# Patient Record
Sex: Male | Born: 1935 | Race: White | Hispanic: No | Marital: Married | Smoking: Never smoker
Health system: Southern US, Community
[De-identification: ages and names within clinical notes are randomized; demographics above are authoritative.]

## PROBLEM LIST (undated history)

## (undated) DIAGNOSIS — Z8719 Personal history of other diseases of the digestive system: Secondary | ICD-10-CM

## (undated) DIAGNOSIS — E785 Hyperlipidemia, unspecified: Secondary | ICD-10-CM

## (undated) DIAGNOSIS — C61 Malignant neoplasm of prostate: Secondary | ICD-10-CM

## (undated) DIAGNOSIS — C4490 Unspecified malignant neoplasm of skin, unspecified: Secondary | ICD-10-CM

## (undated) DIAGNOSIS — I2510 Atherosclerotic heart disease of native coronary artery without angina pectoris: Secondary | ICD-10-CM

## (undated) DIAGNOSIS — I700 Atherosclerosis of aorta: Secondary | ICD-10-CM

## (undated) DIAGNOSIS — I493 Ventricular premature depolarization: Secondary | ICD-10-CM

## (undated) DIAGNOSIS — E6609 Other obesity due to excess calories: Secondary | ICD-10-CM

## (undated) DIAGNOSIS — K219 Gastro-esophageal reflux disease without esophagitis: Secondary | ICD-10-CM

## (undated) DIAGNOSIS — R400 Somnolence: Secondary | ICD-10-CM

## (undated) DIAGNOSIS — I10 Essential (primary) hypertension: Secondary | ICD-10-CM

## (undated) DIAGNOSIS — G4733 Obstructive sleep apnea (adult) (pediatric): Secondary | ICD-10-CM

## (undated) HISTORY — DX: Unspecified malignant neoplasm of skin, unspecified: C44.90

## (undated) HISTORY — DX: Gastro-esophageal reflux disease without esophagitis: K21.9

## (undated) HISTORY — DX: Obstructive sleep apnea (adult) (pediatric): G47.33

## (undated) HISTORY — DX: Personal history of other diseases of the digestive system: Z87.19

## (undated) HISTORY — DX: Atherosclerosis of aorta: I70.0

## (undated) HISTORY — PX: CATARACT EXTRACTION: SUR2

## (undated) HISTORY — DX: Other obesity due to excess calories: E66.09

## (undated) HISTORY — DX: Hyperlipidemia, unspecified: E78.5

## (undated) HISTORY — DX: Essential (primary) hypertension: I10

## (undated) HISTORY — DX: Somnolence: R40.0

## (undated) HISTORY — DX: Ventricular premature depolarization: I49.3

## (undated) HISTORY — DX: Atherosclerotic heart disease of native coronary artery without angina pectoris: I25.10

---

## 1999-06-11 ENCOUNTER — Emergency Department (HOSPITAL_COMMUNITY): Admission: EM | Admit: 1999-06-11 | Discharge: 1999-06-11 | Payer: Self-pay | Admitting: *Deleted

## 1999-06-24 ENCOUNTER — Emergency Department (HOSPITAL_COMMUNITY): Admission: EM | Admit: 1999-06-24 | Discharge: 1999-06-24 | Payer: Self-pay | Admitting: Emergency Medicine

## 1999-06-24 ENCOUNTER — Encounter: Payer: Self-pay | Admitting: Emergency Medicine

## 2001-02-07 ENCOUNTER — Encounter (INDEPENDENT_AMBULATORY_CARE_PROVIDER_SITE_OTHER): Payer: Self-pay

## 2001-02-07 ENCOUNTER — Ambulatory Visit (HOSPITAL_COMMUNITY): Admission: RE | Admit: 2001-02-07 | Discharge: 2001-02-07 | Payer: Self-pay | Admitting: Gastroenterology

## 2009-04-14 ENCOUNTER — Encounter: Admission: RE | Admit: 2009-04-14 | Discharge: 2009-04-14 | Payer: Self-pay | Admitting: Cardiology

## 2009-04-18 HISTORY — PX: CARDIOVASCULAR STRESS TEST: SHX262

## 2010-06-09 ENCOUNTER — Ambulatory Visit: Payer: Self-pay | Admitting: Cardiology

## 2010-06-09 ENCOUNTER — Encounter: Admission: RE | Admit: 2010-06-09 | Discharge: 2010-06-09 | Payer: Self-pay | Admitting: Cardiology

## 2010-06-10 ENCOUNTER — Encounter: Admission: RE | Admit: 2010-06-10 | Discharge: 2010-06-10 | Payer: Self-pay | Admitting: Cardiology

## 2011-01-08 ENCOUNTER — Other Ambulatory Visit: Payer: Self-pay | Admitting: Cardiology

## 2011-01-08 DIAGNOSIS — I10 Essential (primary) hypertension: Secondary | ICD-10-CM

## 2011-01-08 NOTE — Telephone Encounter (Signed)
escribe request  

## 2011-03-01 ENCOUNTER — Encounter: Payer: Self-pay | Admitting: Cardiology

## 2011-03-02 ENCOUNTER — Other Ambulatory Visit: Payer: Self-pay | Admitting: *Deleted

## 2011-03-02 DIAGNOSIS — E785 Hyperlipidemia, unspecified: Secondary | ICD-10-CM

## 2011-03-05 ENCOUNTER — Encounter: Payer: Self-pay | Admitting: Cardiology

## 2011-03-08 ENCOUNTER — Encounter: Payer: Self-pay | Admitting: Cardiology

## 2011-03-08 ENCOUNTER — Other Ambulatory Visit (INDEPENDENT_AMBULATORY_CARE_PROVIDER_SITE_OTHER): Payer: Medicare Other | Admitting: *Deleted

## 2011-03-08 ENCOUNTER — Ambulatory Visit (INDEPENDENT_AMBULATORY_CARE_PROVIDER_SITE_OTHER): Payer: Medicare Other | Admitting: Cardiology

## 2011-03-08 DIAGNOSIS — R972 Elevated prostate specific antigen [PSA]: Secondary | ICD-10-CM | POA: Insufficient documentation

## 2011-03-08 DIAGNOSIS — I119 Hypertensive heart disease without heart failure: Secondary | ICD-10-CM

## 2011-03-08 DIAGNOSIS — Z79899 Other long term (current) drug therapy: Secondary | ICD-10-CM

## 2011-03-08 DIAGNOSIS — E7800 Pure hypercholesterolemia, unspecified: Secondary | ICD-10-CM

## 2011-03-08 DIAGNOSIS — G4730 Sleep apnea, unspecified: Secondary | ICD-10-CM

## 2011-03-08 DIAGNOSIS — E785 Hyperlipidemia, unspecified: Secondary | ICD-10-CM

## 2011-03-08 DIAGNOSIS — I10 Essential (primary) hypertension: Secondary | ICD-10-CM | POA: Insufficient documentation

## 2011-03-08 LAB — HEPATIC FUNCTION PANEL
ALT: 32 U/L (ref 0–53)
AST: 25 U/L (ref 0–37)
Albumin: 4.1 g/dL (ref 3.5–5.2)
Alkaline Phosphatase: 65 U/L (ref 39–117)
Bilirubin, Direct: 0.2 mg/dL (ref 0.0–0.3)
Total Bilirubin: 1 mg/dL (ref 0.3–1.2)
Total Protein: 6.5 g/dL (ref 6.0–8.3)

## 2011-03-08 LAB — BASIC METABOLIC PANEL
BUN: 13 mg/dL (ref 6–23)
CO2: 25 mEq/L (ref 19–32)
Calcium: 8.4 mg/dL (ref 8.4–10.5)
Chloride: 96 mEq/L (ref 96–112)
Creatinine, Ser: 0.8 mg/dL (ref 0.4–1.5)
GFR: 106.89 mL/min (ref >60.00)
Glucose, Bld: 66 mg/dL — ABNORMAL LOW (ref 70–99)
Potassium: 4.8 mEq/L (ref 3.5–5.1)
Sodium: 133 mEq/L — ABNORMAL LOW (ref 135–145)

## 2011-03-08 LAB — LIPID PANEL
Cholesterol: 178 mg/dL (ref 0–200)
HDL: 63.5 mg/dL (ref >39.00)
LDL Cholesterol: 100 mg/dL — ABNORMAL HIGH (ref 0–99)
Total CHOL/HDL Ratio: 3
Triglycerides: 73 mg/dL (ref 0.0–149.0)
VLDL: 14.6 mg/dL (ref 0.0–40.0)

## 2011-03-08 NOTE — Assessment & Plan Note (Signed)
The patient wonders if he may have some degree of sleep apnea.  He wakes up feeling refreshed and doesn't have any problem with sleep.  However, his wife states that if the patient sleeps on his back he will sometimes stop breathing and then start again with a exaggerated deep breath.  If he lies on either side which he usually does he has no problem whatsoever.  He's not interested in pursuing this at this time but just mentioned it.

## 2011-03-08 NOTE — Assessment & Plan Note (Signed)
This pleasant 75 year old gentleman has a history of essential hypertension.  He also has a history of mild exogenous obesity since last visit he has lost 2 pounds.  He has not been expressing any chest pain or shortness of breath.He walks for exercise and also plays golf.

## 2011-03-08 NOTE — Assessment & Plan Note (Signed)
The patient has a past history of hypercholesterolemia.  He is on Lipitor 80 mg daily.  He has not been having myalgias or other side effects from the Lipitor.  Blood work today is pending

## 2011-03-08 NOTE — Assessment & Plan Note (Signed)
The patient hasn't known elevated PSA of around 7.  Dr. Vonita Moss was his urologist and is new urologist will be Dr. Lynnae Sandhoff.  At this point they are just watching a PSA with no particular therapy planned at this point.

## 2011-03-08 NOTE — Progress Notes (Signed)
Jason Compton Date of Birth:  18-May-1936 [facility] / [facility] [address]   [address] [place], [place]  [address] [phone]           Fax   [phone]  History of Present Illness: This pleasant 75 year old gentleman is seen for a six-month followup office visit.  He has a history of essential hypertension and a history of hyperlipidemia as well as exogenous obesity.  His last nuclear stress test was August 2010 and was normal.  He has not been expressing any chest pain.  He has not been expressing any shortness of breath with ordinary activity.  He stays busy playing golf for exercise and walks for exercise.  He is on statin therapy and is not having any side effects from his statin therapy  Current Outpatient Prescriptions  Medication Sig Dispense Refill  . aspirin 81 MG tablet Take 81 mg by mouth daily.        Marland Kitchen atorvastatin (LIPITOR) 80 MG tablet Take 80 mg by mouth daily.        Marland Kitchen BENICAR 40 MG tablet TAKE 1 TABLET DAILY  90 tablet  3  . calcium carbonate (OS-CAL) 600 MG TABS Take 600 mg by mouth daily.        . cholecalciferol (VITAMIN D) 1000 UNITS tablet Take 1,000 Units by mouth daily.        . Multiple Vitamin (MULTIVITAMIN) tablet Take 1 tablet by mouth daily.        Marland Kitchen omeprazole (PRILOSEC) 20 MG capsule Take 20 mg by mouth 2 (two) times daily.        . vitamin C (ASCORBIC ACID) 500 MG tablet Take 500 mg by mouth daily.          Allergies  Allergen Reactions  . Altace (Ramipril)     Patient Active Problem List  Diagnoses  . Benign hypertensive heart disease without heart failure  . Hypercholesterolemia  . PSA elevation  . Sleep apnea    History  Smoking status  . Former Smoker  . Quit date: 08/23/2006  Smokeless tobacco  . Not on file    History  Alcohol Use No    Family History  Problem Relation Age of Onset  . Osteoporosis Mother   . Cancer Father   . Hypertension Brother     Review of Systems: Constitutional: no fever  chills diaphoresis or fatigue or change in weight.  Head and neck: no hearing loss, no epistaxis, no photophobia or visual disturbance. Respiratory: No cough, shortness of breath or wheezing. Cardiovascular: No chest pain peripheral edema, palpitations. Gastrointestinal: No abdominal distention, no abdominal pain, no change in bowel habits hematochezia or melena. Genitourinary: No dysuria, no frequency, no urgency, no nocturia. Musculoskeletal:No arthralgias, no back pain, no gait disturbance or myalgias. Neurological: No dizziness, no headaches, no numbness, no seizures, no syncope, no weakness, no tremors. Hematologic: No lymphadenopathy, no easy bruising. Psychiatric: No confusion, no hallucinations, no sleep disturbance.    Physical Exam: Filed Vitals:   03/08/11 0955  BP: 126/76  Pulse: 68  The general appearance feels a well-developed well-nourished gentleman in no distress.The head and neck exam reveals pupils equal and reactive.  Extraocular movements are full.  There is no scleral icterus.  The mouth and pharynx are normal.  The neck is supple.  The carotids reveal no bruits.  The jugular venous pressure is normal.  The  thyroid is not enlarged.  There is no lymphadenopathy.  The chest is clear  to percussion and auscultation.  There are no rales or rhonchi.  Expansion of the chest is symmetrical.  The precordium is quiet.  The first heart sound is normal.  The second heart sound is physiologically split.  There is no murmur gallop rub or click.  There is no abnormal lift or heave.  The abdomen is soft and nontender.  The bowel sounds are normal.  The liver and spleen are not enlarged.  There are no abdominal masses.  There are no abdominal bruits.  Extremities reveal good pedal pulses.  There is no phlebitis or edema.  There is no cyanosis or clubbing.  Strength is normal and symmetrical in all extremities.  There is no lateralizing weakness.  There are no sensory deficits.  The skin is  warm and dry.  There is no rash.   Assessment / Plan: The patient is to continue same medication.  Lab work is pending.  He will return in 6 months for a comprehensive medical examination.

## 2011-03-09 ENCOUNTER — Telehealth: Payer: Self-pay | Admitting: *Deleted

## 2011-03-09 ENCOUNTER — Encounter: Payer: Self-pay | Admitting: Cardiology

## 2011-03-09 NOTE — Telephone Encounter (Signed)
Labs reported to pt 

## 2011-03-30 ENCOUNTER — Telehealth: Payer: Self-pay | Admitting: Cardiology

## 2011-03-30 NOTE — Telephone Encounter (Signed)
Pt states needs a medical recommendation from Dr. Patty Sermons and would like a call back, asked to speak with Juliette Alcide

## 2011-03-30 NOTE — Telephone Encounter (Signed)
Called patient, had a question regarding a recommended gastroenterology group for his wife.

## 2011-07-12 ENCOUNTER — Encounter: Payer: Self-pay | Admitting: Cardiology

## 2011-07-12 ENCOUNTER — Ambulatory Visit (INDEPENDENT_AMBULATORY_CARE_PROVIDER_SITE_OTHER): Payer: Medicare Other | Admitting: Cardiology

## 2011-07-12 ENCOUNTER — Ambulatory Visit
Admission: RE | Admit: 2011-07-12 | Discharge: 2011-07-12 | Disposition: A | Discharge status: Home / Self Care | Payer: Medicare Other | Source: Ambulatory Visit | Attending: Cardiology | Admitting: Cardiology

## 2011-07-12 DIAGNOSIS — I119 Hypertensive heart disease without heart failure: Secondary | ICD-10-CM

## 2011-07-12 DIAGNOSIS — R109 Unspecified abdominal pain: Secondary | ICD-10-CM | POA: Insufficient documentation

## 2011-07-12 DIAGNOSIS — E7800 Pure hypercholesterolemia, unspecified: Secondary | ICD-10-CM

## 2011-07-12 LAB — BASIC METABOLIC PANEL
BUN: 13 mg/dL (ref 6–23)
CO2: 27 mEq/L (ref 19–32)
Calcium: 8.8 mg/dL (ref 8.4–10.5)
Chloride: 103 mEq/L (ref 96–112)
Creatinine, Ser: 0.8 mg/dL (ref 0.4–1.5)
GFR: 97.83 mL/min (ref >60.00)
Glucose, Bld: 92 mg/dL (ref 70–99)
Potassium: 4.3 mEq/L (ref 3.5–5.1)
Sodium: 137 mEq/L (ref 135–145)

## 2011-07-12 LAB — HEPATIC FUNCTION PANEL
ALT: 29 U/L (ref 0–53)
AST: 21 U/L (ref 0–37)
Albumin: 3.7 g/dL (ref 3.5–5.2)
Alkaline Phosphatase: 61 U/L (ref 39–117)
Bilirubin, Direct: 0.2 mg/dL (ref 0.0–0.3)
Total Bilirubin: 1.2 mg/dL (ref 0.3–1.2)
Total Protein: 6.2 g/dL (ref 6.0–8.3)

## 2011-07-12 LAB — CBC WITH DIFFERENTIAL/PLATELET
Basophils Absolute: 0 10*3/uL (ref 0.0–0.1)
Basophils Relative: 0.4 % (ref 0.0–3.0)
Eosinophils Absolute: 0 10*3/uL (ref 0.0–0.7)
Eosinophils Relative: 0.8 % (ref 0.0–5.0)
HCT: 46 % (ref 39.0–52.0)
Hemoglobin: 15.5 g/dL (ref 13.0–17.0)
Lymphocytes Relative: 29.1 % (ref 12.0–46.0)
Lymphs Abs: 1.4 10*3/uL (ref 0.7–4.0)
MCHC: 33.7 g/dL (ref 30.0–36.0)
MCV: 99.5 fl (ref 78.0–100.0)
Monocytes Absolute: 0.4 10*3/uL (ref 0.1–1.0)
Monocytes Relative: 9.3 % (ref 3.0–12.0)
Neutro Abs: 2.9 10*3/uL (ref 1.4–7.7)
Neutrophils Relative %: 60.4 % (ref 43.0–77.0)
Platelets: 180 10*3/uL (ref 150.0–400.0)
RBC: 4.63 Mil/uL (ref 4.22–5.81)
RDW: 14.1 % (ref 11.5–14.6)
WBC: 4.8 10*3/uL (ref 4.5–10.5)

## 2011-07-12 LAB — LIPASE: Lipase: 22 U/L (ref 11.0–59.0)

## 2011-07-12 LAB — AMYLASE: Amylase: 53 U/L (ref 27–131)

## 2011-07-12 NOTE — Progress Notes (Signed)
Jason Compton Date of Birth:  December 14, 1935 [facility] / [facility] [address]   [address] [place], [place]  [address] [phone]           Fax   [phone]  History of Present Illness: This pleasant 75 year old gentleman is seen for a work in an office visit.  He has a past history of high cholesterol and essential hypertension.  He comes in today because of epigastric pain.  This discomfort began 6-8 weeks ago after he went out to dinner with his wife.  The next morning he noted an epigastric dull discomfort which has persisted since then.  Occasionally it radiates to his back.  Occasionally he has had left scapular discomfort. Initially he had nausea but the nausea had cleared.  Initially his stools were dark but his stools have now become normal color.  He has one normal bowel movement today.  Not have any prior similar symptoms.  He does have a history of gastroesophageal reflux disease and has been on long-term Moton pump inhibitor.  An upper endoscopy and a colonoscopy by Dr. Kinnie Scales  4 to 5 years ago.  Current Outpatient Prescriptions  Medication Sig Dispense Refill  . aspirin 81 MG tablet Take 81 mg by mouth daily.        Marland Kitchen atorvastatin (LIPITOR) 80 MG tablet Take 80 mg by mouth daily.        Marland Kitchen BENICAR 40 MG tablet TAKE 1 TABLET DAILY  90 tablet  3  . calcium carbonate (OS-CAL) 600 MG TABS Take 600 mg by mouth daily.        . cholecalciferol (VITAMIN D) 1000 UNITS tablet Take 1,000 Units by mouth daily.        . Multiple Vitamin (MULTIVITAMIN) tablet Take 1 tablet by mouth daily.        Marland Kitchen omeprazole (PRILOSEC) 20 MG capsule Take 20 mg by mouth 2 (two) times daily.        . vitamin C (ASCORBIC ACID) 500 MG tablet Take 500 mg by mouth daily.          Allergies  Allergen Reactions  . Altace (Ramipril)     Patient Active Problem List  Diagnoses  . Benign hypertensive heart disease without heart failure  . Hypercholesterolemia  . PSA elevation  . Sleep apnea     History  Smoking status  . Former Smoker  . Quit date: 08/23/2006  Smokeless tobacco  . Not on file    History  Alcohol Use No    Family History  Problem Relation Age of Onset  . Osteoporosis Mother   . Cancer Father   . Hypertension Brother     Review of Systems: Constitutional: no fever chills diaphoresis or fatigue or change in weight.  Head and neck: no hearing loss, no epistaxis, no photophobia or visual disturbance. Respiratory: No cough, shortness of breath or wheezing. Cardiovascular: No chest pain peripheral edema, palpitations. Gastrointestinal: No abdominal distention, no abdominal pain, no change in bowel habits hematochezia or melena. Genitourinary: No dysuria, no frequency, no urgency, no nocturia. Musculoskeletal:No arthralgias, no back pain, no gait disturbance or myalgias. Neurological: No dizziness, no headaches, no numbness, no seizures, no syncope, no weakness, no tremors. Hematologic: No lymphadenopathy, no easy bruising. Psychiatric: No confusion, no hallucinations, no sleep disturbance.    Physical Exam: Filed Vitals:   07/12/11 1134  BP: 118/80  Pulse: 78   the general appearance reveals a well-developed well-nourished gentleman in no distress.Pupils equal and  reactive.   Extraocular Movements are full.  There is no scleral icterus.  The mouth and pharynx are normal.  The neck is supple.  The carotids reveal no bruits.  The jugular venous pressure is normal.  The thyroid is not enlarged.  There is no lymphadenopathy.  The chest is clear to percussion and auscultation. There are no rales or rhonchi. Expansion of the chest is symmetrical.  The precordium is quiet.  The first heart sound is normal.  The second heart sound is physiologically split.  There is no murmur gallop rub or click.  There is no abnormal lift or heave.  The abdomen reveals no hepatosplenomegaly.  The abdominal aorta is palpable and is very slightly tender.  No definite  widening of the abdominal aorta.  Bowel sounds are active.  There is no rebound. The pedal pulses are good.  There is no phlebitis or edema.  There is no cyanosis or clubbing. Strength is normal and symmetrical in all extremities.  There is no lateralizing weakness.  There are no sensory deficits.  The skin is warm and dry.  There is no rash.    Assessment / Plan: Await results of today's lab work and ultrasound of the abdomen.  He was cautioned not to do any heavy lifting or straining.  Continue same medications.  Consider referral back to his gastroenterologist if diagnosis remains unclear after today's tests.

## 2011-07-12 NOTE — Patient Instructions (Signed)
Go for your abdominal ultrasound today at 3:45 for 4:00, nothing to eat or drink until after then. Will obtain labs today.  Will call you the results of both tests.

## 2011-07-12 NOTE — Assessment & Plan Note (Signed)
No new cardiovascular symptoms.  No chest pain or shortness of breath.  No palpitations dizziness or syncope.

## 2011-07-12 NOTE — Assessment & Plan Note (Signed)
Patient will continue present dose of Lipitor.  Is not having any side effects from Lipitor.

## 2011-07-12 NOTE — Assessment & Plan Note (Signed)
The abdominal discomfort is dull and persists unchanged throughout the day.  It does not prevent him from getting a good night sleep at night.  It does not seem to be worsened or precipitated by eating.  His examination today reveals slight tenderness over the abdominal aorta.  We are going to get blood work today including CBC hepatic function panel basal metabolic panel amylase and lipase.  We are also scheduling him for an ultrasound of the abdomen to include the pancreas liver gallbladder and abdominal aorta.  This test will be done late this afternoon.  The patient is scheduled to fly to [place] tomorrow to partake in a funeral service for one of his wife's relatives.

## 2011-07-13 ENCOUNTER — Telehealth: Payer: Self-pay | Admitting: *Deleted

## 2011-07-13 NOTE — Telephone Encounter (Signed)
Advise of results

## 2011-07-13 NOTE — Telephone Encounter (Signed)
Message copied by Burnell Blanks on Tue Jul 13, 2011 11:19 AM ------      Message from: Cassell Clement      Created: Mon Jul 12, 2011  5:08 PM       Please report.  The abdominal ultrasound was normal.  The abdominal aorta was normal and there was no aneurysm.  The cause of his abdominal discomfort is not yet clear.  We will wait for the results of the blood work.  If the diagnosis remains unclear we would want him to see his gastroenterologist Dr. Kinnie Scales for his opinion.

## 2011-07-13 NOTE — Telephone Encounter (Signed)
Message copied by Burnell Blanks on Tue Jul 13, 2011 11:18 AM ------      Message from: Cassell Clement      Created: Mon Jul 12, 2011  6:10 PM       Please report.  The CBC is normal.  Her liver function studies are normal.  Electrolytes are normal.  The pancreas enzymes are normal.  Ultrasound of his abdomen and aorta were normal.  If symptoms persist I would suggest that he caught his gastroenterologist Dr. Kinnie Scales.

## 2011-07-13 NOTE — Telephone Encounter (Signed)
Advised patient

## 2011-07-19 ENCOUNTER — Telehealth: Payer: Self-pay | Admitting: Cardiology

## 2011-07-19 NOTE — Telephone Encounter (Signed)
Pt was told to see a gastro per dr Patty Sermons, has question about a particular dr, can he make appt or do we need to?

## 2011-07-19 NOTE — Telephone Encounter (Signed)
New message:  Please call patient.  He said you would know why.

## 2011-07-19 NOTE — Telephone Encounter (Signed)
Let's go ahead and get a abdominal CT scan with contrast to evaluate him further for the cause of his unexplained epigastric pain and tenderness.

## 2011-07-19 NOTE — Telephone Encounter (Signed)
Spoke with patient and he is an established patient of Dr Kinnie Scales. Advised he could call and schedule appointment himself.  Forwarded records to Dr Kinnie Scales

## 2011-07-19 NOTE — Telephone Encounter (Signed)
Wednesday, Thursday & Friday after 2:00

## 2011-07-19 NOTE — Telephone Encounter (Signed)
Still having the same discomfort he has been having.  Unable to get in with GI doctor until middle of January, is this ok to wait?

## 2011-07-20 NOTE — Telephone Encounter (Signed)
Scheduled and advised patient

## 2011-07-20 NOTE — Telephone Encounter (Signed)
12;15 for 12;30 npo 4 hours prior and pick up contrast today-left message

## 2011-07-21 ENCOUNTER — Ambulatory Visit
Admission: RE | Admit: 2011-07-21 | Discharge: 2011-07-21 | Disposition: A | Discharge status: Home / Self Care | Payer: Medicare Other | Source: Ambulatory Visit | Attending: Cardiology | Admitting: Cardiology

## 2011-07-21 MED ORDER — IOHEXOL 300 MG/ML  SOLN
100.0000 mL | Freq: Once | INTRAMUSCULAR | Status: AC | PRN
  Administered 2011-07-21: 100 mL via INTRAVENOUS

## 2011-07-27 ENCOUNTER — Telehealth: Payer: Self-pay | Admitting: Cardiology

## 2011-07-27 NOTE — Telephone Encounter (Signed)
New Msg: Wife calling wanting to make sure Dr. Kinnie Scales gets a copy of all the test restlts Dr. Patty Sermons just did on pt. Please return pt call to discuss further if necessary.

## 2011-07-27 NOTE — Telephone Encounter (Signed)
Sent records again to Dr [facility] office.  Called GI and asked that they get together films for wife to pick up in am.  Advised wife

## 2011-09-16 ENCOUNTER — Encounter: Payer: Self-pay | Admitting: Cardiology

## 2011-09-16 DIAGNOSIS — Z86010 Personal history of colonic polyps: Secondary | ICD-10-CM | POA: Diagnosis not present

## 2011-09-16 DIAGNOSIS — K5730 Diverticulosis of large intestine without perforation or abscess without bleeding: Secondary | ICD-10-CM | POA: Diagnosis not present

## 2011-09-16 DIAGNOSIS — Z1211 Encounter for screening for malignant neoplasm of colon: Secondary | ICD-10-CM | POA: Diagnosis not present

## 2011-09-16 DIAGNOSIS — R1013 Epigastric pain: Secondary | ICD-10-CM | POA: Diagnosis not present

## 2011-09-16 DIAGNOSIS — K648 Other hemorrhoids: Secondary | ICD-10-CM | POA: Diagnosis not present

## 2011-09-16 DIAGNOSIS — R109 Unspecified abdominal pain: Secondary | ICD-10-CM | POA: Diagnosis not present

## 2011-09-16 DIAGNOSIS — D126 Benign neoplasm of colon, unspecified: Secondary | ICD-10-CM | POA: Diagnosis not present

## 2011-09-21 ENCOUNTER — Encounter: Payer: Self-pay | Admitting: Cardiology

## 2011-09-21 DIAGNOSIS — K5730 Diverticulosis of large intestine without perforation or abscess without bleeding: Secondary | ICD-10-CM | POA: Diagnosis not present

## 2011-09-21 DIAGNOSIS — R1013 Epigastric pain: Secondary | ICD-10-CM | POA: Diagnosis not present

## 2011-09-24 ENCOUNTER — Encounter: Payer: Self-pay | Admitting: Cardiology

## 2011-09-24 DIAGNOSIS — R1013 Epigastric pain: Secondary | ICD-10-CM | POA: Diagnosis not present

## 2011-09-24 DIAGNOSIS — R1084 Generalized abdominal pain: Secondary | ICD-10-CM | POA: Diagnosis not present

## 2011-09-24 DIAGNOSIS — R634 Abnormal weight loss: Secondary | ICD-10-CM | POA: Diagnosis not present

## 2011-09-29 ENCOUNTER — Telehealth: Payer: Self-pay | Admitting: Cardiology

## 2011-09-29 DIAGNOSIS — H20019 Primary iridocyclitis, unspecified eye: Secondary | ICD-10-CM | POA: Diagnosis not present

## 2011-09-29 NOTE — Telephone Encounter (Signed)
New problem Pt's wife called She said he has an infection and wants to see Dr Patty Sermons. She did not want to see Lawson Fiscal. Please call her back

## 2011-09-29 NOTE — Telephone Encounter (Signed)
3 days ago eye red and very inflammation and saw eye doctor today.  She put him on eye drops and said to follow up with PCP for ov and labs

## 2011-09-29 NOTE — Telephone Encounter (Signed)
Fu call °Pt's wife returning your call °

## 2011-09-29 NOTE — Telephone Encounter (Signed)
Left messge.

## 2011-09-29 NOTE — Telephone Encounter (Signed)
Scheduled appointment for Tuesday, stated not urgent

## 2011-09-30 DIAGNOSIS — H20019 Primary iridocyclitis, unspecified eye: Secondary | ICD-10-CM | POA: Diagnosis not present

## 2011-10-04 DIAGNOSIS — H20019 Primary iridocyclitis, unspecified eye: Secondary | ICD-10-CM | POA: Diagnosis not present

## 2011-10-06 ENCOUNTER — Ambulatory Visit
Admission: RE | Admit: 2011-10-06 | Discharge: 2011-10-06 | Disposition: A | Discharge status: Home / Self Care | Payer: Medicare Other | Source: Ambulatory Visit | Attending: Cardiology | Admitting: Cardiology

## 2011-10-06 ENCOUNTER — Telehealth: Payer: Self-pay | Admitting: *Deleted

## 2011-10-06 ENCOUNTER — Ambulatory Visit (INDEPENDENT_AMBULATORY_CARE_PROVIDER_SITE_OTHER): Payer: Medicare Other | Admitting: Cardiology

## 2011-10-06 ENCOUNTER — Encounter: Payer: Self-pay | Admitting: Cardiology

## 2011-10-06 VITALS — BP 110/70 | HR 80 | Ht 68.0 in | Wt 182.0 lb

## 2011-10-06 DIAGNOSIS — Z1159 Encounter for screening for other viral diseases: Secondary | ICD-10-CM

## 2011-10-06 DIAGNOSIS — E7800 Pure hypercholesterolemia, unspecified: Secondary | ICD-10-CM

## 2011-10-06 DIAGNOSIS — R109 Unspecified abdominal pain: Secondary | ICD-10-CM

## 2011-10-06 DIAGNOSIS — I119 Hypertensive heart disease without heart failure: Secondary | ICD-10-CM

## 2011-10-06 DIAGNOSIS — E559 Vitamin D deficiency, unspecified: Secondary | ICD-10-CM | POA: Diagnosis not present

## 2011-10-06 DIAGNOSIS — H209 Unspecified iridocyclitis: Secondary | ICD-10-CM | POA: Diagnosis not present

## 2011-10-06 DIAGNOSIS — R972 Elevated prostate specific antigen [PSA]: Secondary | ICD-10-CM

## 2011-10-06 DIAGNOSIS — J329 Chronic sinusitis, unspecified: Secondary | ICD-10-CM

## 2011-10-06 DIAGNOSIS — J3489 Other specified disorders of nose and nasal sinuses: Secondary | ICD-10-CM | POA: Diagnosis not present

## 2011-10-06 DIAGNOSIS — I10 Essential (primary) hypertension: Secondary | ICD-10-CM | POA: Diagnosis not present

## 2011-10-06 LAB — SEDIMENTATION RATE: Sed Rate: 5 mm/hr (ref 0–22)

## 2011-10-06 NOTE — Progress Notes (Signed)
Jason Compton Date of Birth:  1936/01/20 [facility] [address] [place], [place]  [address] [phone]         Fax   [phone]  History of Present Illness: This pleasant 76 year old gentleman is seen for a followup office visit.  He has an interesting recent past history.  He has had significant weight loss over the past 6 months.  He has lost about 40 pounds.  He has a poor appetite and has developed some fat intolerance.  He has had a full GI workup including CT scan of the abdomen as well as endoscopy.  He had a recent biopsy which suggested possible celiac sprue.  However he had a subsequent negative antibody test for celiac sprue.  He has seen Durwin Nora who has instructed him on a gluten-free diet.  He recently saw his ophthalmologist who diagnosed systemic uveitis and raised questions of possible systemic disease or collagen vascular disease and gave [place] a list of blood tests that she recommended that he have.  We ordered those blood tests today.  Tolerance recently added fish oil I thousand and milligrams daily and increased his vitamin D3 up to a dose of 2000 units daily.  The patient has not been experiencing any recent chest pain.  He has had less energy.  Since we last saw him he has lost 19 more pounds.  Current Outpatient Prescriptions  Medication Sig Dispense Refill  . aspirin 81 MG tablet Take 81 mg by mouth daily.        Marland Kitchen atorvastatin (LIPITOR) 80 MG tablet Take 80 mg by mouth daily.        . calcium carbonate (OS-CAL) 600 MG TABS Take 600 mg by mouth daily.        . cholecalciferol (VITAMIN D) 1000 UNITS tablet Take 2,000 Units by mouth daily.       . Multiple Vitamin (MULTIVITAMIN) tablet Take 1 tablet by mouth daily.        . Omega-3 Fatty Acids (FISH OIL PO) Take by mouth daily.      . ranitidine (ZANTAC) 150 MG tablet Take 150 mg by mouth as needed.      . vitamin C (ASCORBIC ACID) 500 MG tablet Take 500 mg by mouth daily.           Allergies  Allergen Reactions  . Altace (Ramipril)     Patient Active Problem List  Diagnoses  . Benign hypertensive heart disease without heart failure  . Hypercholesterolemia  . PSA elevation  . Sleep apnea  . Abdominal pain    History  Smoking status  . Former Smoker  . Quit date: 08/23/2006  Smokeless tobacco  . Not on file    History  Alcohol Use No    Family History  Problem Relation Age of Onset  . Osteoporosis Mother   . Cancer Father   . Hypertension Brother     Review of Systems: Constitutional: no fever chills diaphoresis or fatigue or change in weight.  Head and neck: no hearing loss, no epistaxis, no photophobia or visual disturbance. Respiratory: No cough, shortness of breath or wheezing. Cardiovascular: No chest pain peripheral edema, palpitations. Gastrointestinal: No abdominal distention, no abdominal pain, no change in bowel habits hematochezia or melena. Genitourinary: No dysuria, no frequency, no urgency, no nocturia. Musculoskeletal:No arthralgias, no back pain, no gait disturbance or myalgias. Neurological: No dizziness, no headaches, no numbness, no seizures, no syncope, no weakness, no tremors. Hematologic: No lymphadenopathy, no easy  bruising. Psychiatric: No confusion, no hallucinations, no sleep disturbance.    Physical Exam: Filed Vitals:   10/06/11 1044  BP: 110/70  Pulse: 80   the general appearance reveals a well-developed well-nourished gentleman in no distress.  Head and neck exam reveals a dry rash on the scalp and face.  He also has evidence of low grade bilateral uveitis.  No thyroid enlargement.  No lymphadenopathy.The chest is clear to percussion and auscultation. There are no rales or rhonchi. Expansion of the chest is symmetrical.  The precordium is quiet.  The first heart sound is normal.  The second heart sound is physiologically split.  There is no murmur gallop rub or click.  There is no abnormal lift or  heave.  The abdomen is soft and nontender. Bowel sounds are normal. The liver and spleen are not enlarged. There Are no abdominal masses. There are no bruits.  The pedal pulses are good.  There is no phlebitis or edema.  There is no cyanosis or clubbing. Strength is normal and symmetrical in all extremities.  There is no lateralizing weakness.  There are no sensory deficits.     Assessment / Plan: We will obtain labs as suggested by his ophthalmologist in regard to his bilateral uveitis.  He has already had a CBC.  We will get a sedimentation rate and a nuclear antibody as well as a RPR and FTA and a serum ACE level.  We'll also check lipid panel and vitamin D level.  Also get a chest x-ray in since he is also having chronic nasal drainage we will get a series of sinus x-rays.  We will stop his Benicar at this point and consider subsequent reduction in Lipitor when we see what today's labs are.  He will return for a comprehensive office visit in 2 months.

## 2011-10-06 NOTE — Telephone Encounter (Signed)
Message copied by Burnell Blanks on Wed Oct 06, 2011  3:15 PM ------      Message from: Cassell Clement      Created: Wed Oct 06, 2011  2:22 PM       Please report.  Chest xray is normal.  No enlarged heart. Lungs clear.  No evidence of sarcoidosis or TB or other systemic problem

## 2011-10-06 NOTE — Patient Instructions (Signed)
Will obtain labs today and call you with the results Will get xrays and call with the results Follow up April 22 at 10:45, will get labs on April 18 after 8:30 am ---fasting

## 2011-10-06 NOTE — Telephone Encounter (Signed)
Advised patient and mailed copy 

## 2011-10-06 NOTE — Assessment & Plan Note (Signed)
The patient has a history of hypercholesterolemia and has been on Lipitor 80 mg daily.  His nutritionist and his wife were concerned about possible long-term adverse affects on memory etc. we will see what his lipid status is today and consider significant reduction in Lipitor.

## 2011-10-06 NOTE — Assessment & Plan Note (Signed)
He has a long history of elevated PSA and is followed at regular intervals by his urologist.

## 2011-10-06 NOTE — Telephone Encounter (Signed)
Message copied by Burnell Blanks on Wed Oct 06, 2011  3:15 PM ------      Message from: Cassell Clement      Created: Wed Oct 06, 2011  2:21 PM       Please report.  Sinus xrays show no evidence of infection or fluid.  Mild mucosal thickening may be due to allergies.  He could try some OTC claritin 10 mg daily prn.

## 2011-10-06 NOTE — Assessment & Plan Note (Signed)
The patient discovered on the Internet any reference to a study which links Benicar to a GI disorder that mimics celiac disease.  As a result of his weight loss his blood pressure is down and we will stop his Benicar and observe response.  At this point we will not substitute any other pressure medication.

## 2011-10-07 LAB — ANGIOTENSIN CONVERTING ENZYME: Angiotensin-Converting Enzyme: 26 U/L (ref 8–52)

## 2011-10-07 LAB — ANTI-NUCLEAR AB-TITER (ANA TITER): ANA Titer 1: 1:1280 {titer} — ABNORMAL HIGH

## 2011-10-07 LAB — RPR

## 2011-10-07 LAB — ANA: Anti Nuclear Antibody(ANA): POSITIVE — AB

## 2011-10-07 LAB — VITAMIN D 25 HYDROXY (VIT D DEFICIENCY, FRACTURES): Vit D, 25-Hydroxy: 38 ng/mL (ref 30–89)

## 2011-10-08 LAB — FLUORESCENT TREPONEMAL AB(FTA)-IGG-BLD: Fluorescent Treponemal Ab, IgG: NONREACTIVE

## 2011-10-11 ENCOUNTER — Telehealth: Payer: Self-pay | Admitting: Cardiology

## 2011-10-11 NOTE — Telephone Encounter (Signed)
Pt's wife wants results of blood work faxed to them, have to call her first then can put phone on fax so we can send, pls call

## 2011-10-11 NOTE — Telephone Encounter (Signed)
Spoke with wife and she is ok with mailing information to patient, advised will send out

## 2011-10-14 ENCOUNTER — Telehealth: Payer: Self-pay | Admitting: Cardiology

## 2011-10-14 NOTE — Telephone Encounter (Signed)
Larita Fife from [facility] called because she said she  received  Documentation on pt via fax yesterday. This documentation is addressed to Dr. Sharrell Ku. The GI in that office is Dr Hilma Favors . Larita Fife will discard this document and want for Juliette Alcide to know about it.

## 2011-10-14 NOTE — Telephone Encounter (Signed)
New problem;  Per Larita Fife, receive records on yesterday, have some questions

## 2011-10-15 ENCOUNTER — Telehealth: Payer: Self-pay | Admitting: Cardiology

## 2011-10-15 ENCOUNTER — Telehealth: Payer: Self-pay | Admitting: *Deleted

## 2011-10-15 NOTE — Telephone Encounter (Signed)
New problem   Patient would like a return call regarding lab work being sent to Dr. Dawna Part [facility])[phone].  Please return call to patient on mobile#  [phone]

## 2011-10-15 NOTE — Telephone Encounter (Signed)
Left message for lab to call back. 

## 2011-10-15 NOTE — Telephone Encounter (Signed)
Appointment 11/12/11 at 12:00, left message

## 2011-10-15 NOTE — Telephone Encounter (Signed)
Message copied by Burnell Blanks on Fri Oct 15, 2011  5:18 PM ------      Message from: Cassell Clement      Created: Mon Oct 11, 2011  8:54 AM       I gave him the reports of his positive antinuclear antibody.  Please refer him to Dr. Lendon Colonel, rheumatologist.  In terms of his GI symptoms, he is feeling much better off Benicar and on a gluten free diet.  Discuss with me the results of his lipid panel which he was asking about

## 2011-10-15 NOTE — Telephone Encounter (Signed)
Left message at Dr Lendon Colonel office to call back regarding appointment and info sent over

## 2011-10-15 NOTE — Telephone Encounter (Signed)
Appointment with Dr Lendon Colonel on 3/22 at 12 noon

## 2011-10-15 NOTE — Telephone Encounter (Signed)
Dx code given to [facility]

## 2011-10-15 NOTE — Telephone Encounter (Signed)
New Msg: Lab calling wanting to get DX code for pt. Please return call to discuss further.

## 2011-10-18 DIAGNOSIS — R5381 Other malaise: Secondary | ICD-10-CM | POA: Diagnosis not present

## 2011-10-18 DIAGNOSIS — R109 Unspecified abdominal pain: Secondary | ICD-10-CM | POA: Diagnosis not present

## 2011-10-18 DIAGNOSIS — K900 Celiac disease: Secondary | ICD-10-CM | POA: Diagnosis not present

## 2011-10-18 DIAGNOSIS — R894 Abnormal immunological findings in specimens from other organs, systems and tissues: Secondary | ICD-10-CM | POA: Diagnosis not present

## 2011-10-18 DIAGNOSIS — H20019 Primary iridocyclitis, unspecified eye: Secondary | ICD-10-CM | POA: Diagnosis not present

## 2011-10-18 DIAGNOSIS — H209 Unspecified iridocyclitis: Secondary | ICD-10-CM | POA: Diagnosis not present

## 2011-10-18 NOTE — Telephone Encounter (Signed)
Left information on phone, call to let us know received.

## 2011-10-18 NOTE — Telephone Encounter (Signed)
Spoke with patient regarding information

## 2011-10-18 NOTE — Telephone Encounter (Signed)
F/u call

## 2011-10-26 DIAGNOSIS — K900 Celiac disease: Secondary | ICD-10-CM | POA: Diagnosis not present

## 2011-10-29 DIAGNOSIS — H35359 Cystoid macular degeneration, unspecified eye: Secondary | ICD-10-CM | POA: Diagnosis not present

## 2011-11-03 DIAGNOSIS — H26499 Other secondary cataract, unspecified eye: Secondary | ICD-10-CM | POA: Diagnosis not present

## 2011-11-03 DIAGNOSIS — H43819 Vitreous degeneration, unspecified eye: Secondary | ICD-10-CM | POA: Diagnosis not present

## 2011-11-03 DIAGNOSIS — H35359 Cystoid macular degeneration, unspecified eye: Secondary | ICD-10-CM | POA: Diagnosis not present

## 2011-11-08 DIAGNOSIS — H35359 Cystoid macular degeneration, unspecified eye: Secondary | ICD-10-CM | POA: Diagnosis not present

## 2011-11-11 ENCOUNTER — Ambulatory Visit: Payer: Medicare Other | Admitting: Cardiology

## 2011-11-15 DIAGNOSIS — K900 Celiac disease: Secondary | ICD-10-CM | POA: Diagnosis not present

## 2011-11-15 DIAGNOSIS — R894 Abnormal immunological findings in specimens from other organs, systems and tissues: Secondary | ICD-10-CM | POA: Diagnosis not present

## 2011-11-15 DIAGNOSIS — H209 Unspecified iridocyclitis: Secondary | ICD-10-CM | POA: Diagnosis not present

## 2011-11-16 DIAGNOSIS — H43819 Vitreous degeneration, unspecified eye: Secondary | ICD-10-CM | POA: Diagnosis not present

## 2011-11-16 DIAGNOSIS — H26499 Other secondary cataract, unspecified eye: Secondary | ICD-10-CM | POA: Diagnosis not present

## 2011-11-16 DIAGNOSIS — H35359 Cystoid macular degeneration, unspecified eye: Secondary | ICD-10-CM | POA: Diagnosis not present

## 2011-12-08 DIAGNOSIS — K648 Other hemorrhoids: Secondary | ICD-10-CM | POA: Diagnosis not present

## 2011-12-09 ENCOUNTER — Other Ambulatory Visit: Payer: Medicare Other

## 2011-12-10 ENCOUNTER — Other Ambulatory Visit (INDEPENDENT_AMBULATORY_CARE_PROVIDER_SITE_OTHER): Payer: Medicare Other

## 2011-12-10 DIAGNOSIS — E7800 Pure hypercholesterolemia, unspecified: Secondary | ICD-10-CM | POA: Diagnosis not present

## 2011-12-10 LAB — HEPATIC FUNCTION PANEL
ALT: 17 U/L (ref 0–53)
AST: 18 U/L (ref 0–37)
Albumin: 3.8 g/dL (ref 3.5–5.2)
Alkaline Phosphatase: 53 U/L (ref 39–117)
Bilirubin, Direct: 0.1 mg/dL (ref 0.0–0.3)
Total Bilirubin: 0.7 mg/dL (ref 0.3–1.2)
Total Protein: 6.3 g/dL (ref 6.0–8.3)

## 2011-12-10 LAB — BASIC METABOLIC PANEL
BUN: 12 mg/dL (ref 6–23)
CO2: 29 mEq/L (ref 19–32)
Calcium: 8.8 mg/dL (ref 8.4–10.5)
Chloride: 99 mEq/L (ref 96–112)
Creatinine, Ser: 0.8 mg/dL (ref 0.4–1.5)
GFR: 95.03 mL/min (ref >60.00)
Glucose, Bld: 105 mg/dL — ABNORMAL HIGH (ref 70–99)
Potassium: 4.1 mEq/L (ref 3.5–5.1)
Sodium: 135 mEq/L (ref 135–145)

## 2011-12-10 LAB — LIPID PANEL
Cholesterol: 274 mg/dL — ABNORMAL HIGH (ref 0–200)
HDL: 71.9 mg/dL (ref >39.00)
Total CHOL/HDL Ratio: 4
Triglycerides: 36 mg/dL (ref 0.0–149.0)
VLDL: 7.2 mg/dL (ref 0.0–40.0)

## 2011-12-10 LAB — LDL CHOLESTEROL, DIRECT: Direct LDL: 189.7 mg/dL

## 2011-12-10 NOTE — Progress Notes (Signed)
Quick Note:  Please make copy of labs for patient visit. ______ 

## 2011-12-13 ENCOUNTER — Encounter: Payer: Self-pay | Admitting: Cardiology

## 2011-12-13 ENCOUNTER — Ambulatory Visit (INDEPENDENT_AMBULATORY_CARE_PROVIDER_SITE_OTHER): Payer: Medicare Other | Admitting: Cardiology

## 2011-12-13 VITALS — BP 130/80 | HR 62 | Ht 68.0 in | Wt 187.0 lb

## 2011-12-13 DIAGNOSIS — E7800 Pure hypercholesterolemia, unspecified: Secondary | ICD-10-CM

## 2011-12-13 DIAGNOSIS — R109 Unspecified abdominal pain: Secondary | ICD-10-CM

## 2011-12-13 DIAGNOSIS — I119 Hypertensive heart disease without heart failure: Secondary | ICD-10-CM

## 2011-12-13 NOTE — Patient Instructions (Signed)
Restart Lipitor 80 mg daily  Your physician recommends that you schedule a follow-up appointment in: 4 months with fasting labs (lp/bmet/hfp)

## 2011-12-13 NOTE — Progress Notes (Signed)
Jason Compton Date of Birth:  October 23, 1935 [facility] [address] [place], [place]  [address] [phone]         Fax   [phone]  History of Present Illness: This pleasant 76 year old gentleman is seen for a scheduled followup office visit.  He has a past history of essential hypertension and a history of hypercholesterolemia.  The patient does not have any history of known ischemic heart disease and had a normal treadmill Cardiolite stress test on 04/18/09.  He recently had a lot of GI issues and was found to have gluten intolerance.  Now that he is on a gluten-free diet he is doing very well and has regained some of the weight that he has lost.  He is not having any GI issues now.  He did have a rubber band procedure by his gastroenterologist a week ago for hemorrhoids.  The patient has a history of iritis and retinitis and is followed by Dr. Stephannie Li for his eyes.  The patient has a positive antinuclear antibody level and is followed by Dr. Nickola Major.  Dr. Nickola Major did a thorough rheumatologic workup and found no evidence of lupus or other abnormality.  Several months ago when the patient was losing weight so much we did stop his statin therapy temporarily.  Current Outpatient Prescriptions  Medication Sig Dispense Refill  . aspirin 81 MG tablet Take 81 mg by mouth daily.        Marland Kitchen atorvastatin (LIPITOR) 80 MG tablet Take 80 mg by mouth daily.      Marland Kitchen BROMDAY 0.09 % SOLN as directed.      . calcium carbonate (OS-CAL) 600 MG TABS Take 600 mg by mouth daily.        . cholecalciferol (VITAMIN D) 1000 UNITS tablet Take 2,000 Units by mouth daily.       . Multiple Vitamin (MULTIVITAMIN) tablet Take 1 tablet by mouth daily.        . Omega-3 Fatty Acids (FISH OIL PO) Take by mouth daily.      . prednisoLONE acetate (PRED FORTE) 1 % ophthalmic suspension as directed.      . ranitidine (ZANTAC) 150 MG tablet Take 150 mg by mouth as needed.      . vitamin C (ASCORBIC ACID)  500 MG tablet Take 500 mg by mouth daily.          Allergies  Allergen Reactions  . Altace (Ramipril)     Patient Active Problem List  Diagnoses  . Benign hypertensive heart disease without heart failure  . Hypercholesterolemia  . PSA elevation  . Sleep apnea  . Abdominal pain    History  Smoking status  . Former Smoker  . Quit date: 08/23/2006  Smokeless tobacco  . Not on file    History  Alcohol Use No    Family History  Problem Relation Age of Onset  . Osteoporosis Mother   . Cancer Father   . Hypertension Brother     Review of Systems: Constitutional: no fever chills diaphoresis or fatigue or change in weight.  Head and neck: no hearing loss, no epistaxis, no photophobia or visual disturbance. Respiratory: No cough, shortness of breath or wheezing. Cardiovascular: No chest pain peripheral edema, palpitations. Gastrointestinal: No abdominal distention, no abdominal pain, no change in bowel habits hematochezia or melena. Genitourinary: No dysuria, no frequency, no urgency, no nocturia. Musculoskeletal:No arthralgias, no back pain, no gait disturbance or myalgias. Neurological: No dizziness, no headaches, no numbness, no  seizures, no syncope, no weakness, no tremors. Hematologic: No lymphadenopathy, no easy bruising. Psychiatric: No confusion, no hallucinations, no sleep disturbance.    Physical Exam: Filed Vitals:   12/13/11 1033  BP: 130/80  Pulse: 62   the general appearance reveals a well-developed well-nourished gentleman in no distress.Pupils equal and reactive.   Extraocular Movements are full.  There is no scleral icterus.  The mouth and pharynx are normal.  The neck is supple.  The carotids reveal no bruits.  The jugular venous pressure is normal.  The thyroid is not enlarged.  There is no lymphadenopathy.  The chest is clear to percussion and auscultation. There are no rales or rhonchi. Expansion of the chest is symmetrical.  The precordium is  quiet.  The first heart sound is normal.  The second heart sound is physiologically split.  There is no murmur gallop rub or click.  There is no abnormal lift or heave.  The abdomen is soft and nontender. Bowel sounds are normal. The liver and spleen are not enlarged. There Are no abdominal masses. There are no bruits.  The pedal pulses are good.  There is no phlebitis or edema.  There is no cyanosis or clubbing. Strength is normal and symmetrical in all extremities.  There is no lateralizing weakness.  There are no sensory deficits.  The skin is warm and dry.  There is no rash.   EKG today shows normal sinus rhythm and no ischemic changes.  He does have left axis deviation.  The tracing is unchanged from prior tracings.   Assessment / Plan: Continue same medication.  Restart Lipitor 80 mg daily.  Increase regular exercise.  Recheck in 4 months for followup office visit and fasting lab work to see how he is doing on Lipitor

## 2011-12-13 NOTE — Assessment & Plan Note (Signed)
Since stopping the Lipitor his cholesterol is increased from 178 up to 274 and his LDL level is also very high.  We will have the patient restart his Lipitor at this time the dose of 80 mg daily which was his previous dose.

## 2011-12-13 NOTE — Assessment & Plan Note (Signed)
The patient has remained normotensive since we stopped his Benicar and his diuretic.

## 2011-12-13 NOTE — Assessment & Plan Note (Signed)
His abdominal pain has completely resolved since going on a gluten-free diet.  His bowel movements are normally has one bowel movement a day.

## 2011-12-21 DIAGNOSIS — H43819 Vitreous degeneration, unspecified eye: Secondary | ICD-10-CM | POA: Diagnosis not present

## 2011-12-21 DIAGNOSIS — H35359 Cystoid macular degeneration, unspecified eye: Secondary | ICD-10-CM | POA: Diagnosis not present

## 2011-12-22 DIAGNOSIS — K648 Other hemorrhoids: Secondary | ICD-10-CM | POA: Diagnosis not present

## 2012-01-05 DIAGNOSIS — K648 Other hemorrhoids: Secondary | ICD-10-CM | POA: Diagnosis not present

## 2012-01-13 DIAGNOSIS — H26499 Other secondary cataract, unspecified eye: Secondary | ICD-10-CM | POA: Diagnosis not present

## 2012-02-10 DIAGNOSIS — N401 Enlarged prostate with lower urinary tract symptoms: Secondary | ICD-10-CM | POA: Diagnosis not present

## 2012-02-10 DIAGNOSIS — N138 Other obstructive and reflux uropathy: Secondary | ICD-10-CM | POA: Diagnosis not present

## 2012-02-10 DIAGNOSIS — C61 Malignant neoplasm of prostate: Secondary | ICD-10-CM | POA: Diagnosis not present

## 2012-02-17 ENCOUNTER — Other Ambulatory Visit: Payer: Self-pay | Admitting: Cardiology

## 2012-02-17 DIAGNOSIS — H26499 Other secondary cataract, unspecified eye: Secondary | ICD-10-CM | POA: Diagnosis not present

## 2012-02-17 NOTE — Telephone Encounter (Signed)
Refilled lipitor 

## 2012-04-13 ENCOUNTER — Ambulatory Visit: Payer: Medicare Other | Admitting: Cardiology

## 2012-04-13 ENCOUNTER — Other Ambulatory Visit: Payer: Medicare Other

## 2012-04-25 DIAGNOSIS — K900 Celiac disease: Secondary | ICD-10-CM | POA: Diagnosis not present

## 2012-04-28 ENCOUNTER — Other Ambulatory Visit (INDEPENDENT_AMBULATORY_CARE_PROVIDER_SITE_OTHER): Payer: Medicare Other

## 2012-04-28 ENCOUNTER — Encounter: Payer: Self-pay | Admitting: Cardiology

## 2012-04-28 ENCOUNTER — Ambulatory Visit (INDEPENDENT_AMBULATORY_CARE_PROVIDER_SITE_OTHER): Payer: Medicare Other | Admitting: Cardiology

## 2012-04-28 VITALS — BP 130/82 | HR 74 | Ht 67.0 in | Wt 192.0 lb

## 2012-04-28 DIAGNOSIS — E7800 Pure hypercholesterolemia, unspecified: Secondary | ICD-10-CM | POA: Diagnosis not present

## 2012-04-28 DIAGNOSIS — I119 Hypertensive heart disease without heart failure: Secondary | ICD-10-CM | POA: Diagnosis not present

## 2012-04-28 LAB — BASIC METABOLIC PANEL
BUN: 17 mg/dL (ref 6–23)
CO2: 27 mEq/L (ref 19–32)
Calcium: 9.2 mg/dL (ref 8.4–10.5)
Chloride: 105 mEq/L (ref 96–112)
Creatinine, Ser: 0.8 mg/dL (ref 0.4–1.5)
GFR: 94.93 mL/min (ref >60.00)
Glucose, Bld: 91 mg/dL (ref 70–99)
Potassium: 4.7 mEq/L (ref 3.5–5.1)
Sodium: 139 mEq/L (ref 135–145)

## 2012-04-28 LAB — LIPID PANEL
Cholesterol: 187 mg/dL (ref 0–200)
HDL: 84.7 mg/dL (ref >39.00)
LDL Cholesterol: 94 mg/dL (ref 0–99)
Total CHOL/HDL Ratio: 2
Triglycerides: 40 mg/dL (ref 0.0–149.0)
VLDL: 8 mg/dL (ref 0.0–40.0)

## 2012-04-28 LAB — HEPATIC FUNCTION PANEL
ALT: 21 U/L (ref 0–53)
AST: 21 U/L (ref 0–37)
Albumin: 4.1 g/dL (ref 3.5–5.2)
Alkaline Phosphatase: 64 U/L (ref 39–117)
Bilirubin, Direct: 0.2 mg/dL (ref 0.0–0.3)
Total Bilirubin: 0.9 mg/dL (ref 0.3–1.2)
Total Protein: 6.8 g/dL (ref 6.0–8.3)

## 2012-04-28 MED ORDER — ATORVASTATIN CALCIUM 40 MG PO TABS: 40.0000 mg | ORAL_TABLET | Freq: Every day | ORAL | Status: DC

## 2012-04-28 NOTE — Patient Instructions (Addendum)
Decrease your to Lipitor 40 mg daily, Rx sent to [facility]  Your physician wants you to follow-up in: 4 months with fasting labs (lp/bmet/hfp)  You will receive a reminder letter in the mail two months in advance. If you don't receive a letter, please call our office to schedule the follow-up appointment.   Will obtain labs today and call you with the results

## 2012-04-28 NOTE — Progress Notes (Signed)
Jason Compton Date of Birth:  April 22, 1936 [facility] [address] [place], [place]  [address] [phone]         Fax   [phone]  History of Present Illness: This pleasant 76 year old gentleman is seen for a scheduled followup office visit. He has a past history of essential hypertension and a history of hypercholesterolemia. The patient does not have any history of known ischemic heart disease and had a normal treadmill Cardiolite stress test on 04/18/09. He recently had a lot of GI issues and was found to have gluten intolerance. Now that he is on a gluten-free diet he is doing very well and has regained some of the weight that he has lost. He is not having any GI issues now.  The patient has a history of iritis and retinitis and is followed by Dr. Stephannie Li for his eyes. The patient has a positive antinuclear antibody level and is followed by Dr. Nickola Major. Dr. Nickola Major did a thorough rheumatologic workup and found no evidence of lupus or other abnormality. Several months ago when the patient was losing weight so much we did stop his statin therapy temporarily.  He is now back on his Lipitor.    Current Outpatient Prescriptions  Medication Sig Dispense Refill  . aspirin 81 MG tablet Take 81 mg by mouth daily.        Marland Kitchen atorvastatin (LIPITOR) 40 MG tablet Take 1 tablet (40 mg total) by mouth daily.  90 tablet  3  . BROMDAY 0.09 % SOLN as directed.      . calcium carbonate (OS-CAL) 600 MG TABS Take 600 mg by mouth daily.        . cholecalciferol (VITAMIN D) 1000 UNITS tablet Take 2,000 Units by mouth daily.       . Multiple Vitamin (MULTIVITAMIN) tablet Take 1 tablet by mouth daily.        . Omega-3 Fatty Acids (FISH OIL PO) Take by mouth daily.      . prednisoLONE acetate (PRED FORTE) 1 % ophthalmic suspension as directed.      . ranitidine (ZANTAC) 150 MG tablet Take 150 mg by mouth as needed.      . vitamin C (ASCORBIC ACID) 500 MG tablet Take 500 mg by mouth  daily.          Allergies  Allergen Reactions  . Altace (Ramipril)     Patient Active Problem List  Diagnosis  . Benign hypertensive heart disease without heart failure  . Hypercholesterolemia  . PSA elevation  . Sleep apnea  . Abdominal pain    History  Smoking status  . Former Smoker  . Quit date: 08/23/2006  Smokeless tobacco  . Not on file    History  Alcohol Use No    Family History  Problem Relation Age of Onset  . Osteoporosis Mother   . Cancer Father   . Hypertension Brother     Review of Systems: Constitutional: no fever chills diaphoresis or fatigue or change in weight.  Head and neck: no hearing loss, no epistaxis, no photophobia or visual disturbance. Respiratory: No cough, shortness of breath or wheezing. Cardiovascular: No chest pain peripheral edema, palpitations. Gastrointestinal: No abdominal distention, no abdominal pain, no change in bowel habits hematochezia or melena. Genitourinary: No dysuria, no frequency, no urgency, no nocturia. Musculoskeletal:No arthralgias, no back pain, no gait disturbance or myalgias. Neurological: No dizziness, no headaches, no numbness, no seizures, no syncope, no weakness, no tremors. Hematologic: No lymphadenopathy,  no easy bruising. Psychiatric: No confusion, no hallucinations, no sleep disturbance.    Physical Exam: Filed Vitals:   04/28/12 1001  BP: 130/82  Pulse: 74   the general appearance reveals a well-developed well-nourished gentleman in no distress.The head and neck exam reveals pupils equal and reactive.  Extraocular movements are full.  There is no scleral icterus.  The mouth and pharynx are normal.  The neck is supple.  The carotids reveal no bruits.  The jugular venous pressure is normal.  The  thyroid is not enlarged.  There is no lymphadenopathy.  The chest is clear to percussion and auscultation.  There are no rales or rhonchi.  Expansion of the chest is symmetrical.  The precordium is quiet.   The first heart sound is normal.  The second heart sound is physiologically split.  There is no murmur gallop rub or click.  There is no abnormal lift or heave.  The abdomen is soft and nontender.  The bowel sounds are normal.  The liver and spleen are not enlarged.  There are no abdominal masses.  There are no abdominal bruits.  Extremities reveal good pedal pulses.  There is no phlebitis or edema.  There is no cyanosis or clubbing.  Strength is normal and symmetrical in all extremities.  There is no lateralizing weakness.  There are no sensory deficits.  The skin is warm and dry.  There is no rash.     Assessment / Plan: Continue on same meds except for lower dose of Lipitor.  Recheck in 4 months for office visit EKG and fasting lab work

## 2012-04-28 NOTE — Progress Notes (Signed)
Quick Note:  Please report to patient. The recent labs are stable. Continue same medication and careful diet. Okay to decrease lipitor to 40 mg as we discussed. ______

## 2012-04-28 NOTE — Assessment & Plan Note (Signed)
The patient has not been expressing any chest pain or shortness of breath.  No dizzy spells or syncope.  No palpitations.

## 2012-04-28 NOTE — Assessment & Plan Note (Signed)
The patient is on Lipitor 80 mg a day.  His wife had read that Lipitor can sometimes affect and memory.  He thinks that his memory is not as sharp as it used to be.  We will try cutting back on the Lipitor to just 40 mg a day going forward

## 2012-04-28 NOTE — Assessment & Plan Note (Signed)
The patient's abdominal pain has resolved on a gluten-free diet.

## 2012-05-02 ENCOUNTER — Telehealth: Payer: Self-pay | Admitting: *Deleted

## 2012-05-02 NOTE — Telephone Encounter (Signed)
Advised patient of lab results  

## 2012-05-02 NOTE — Telephone Encounter (Signed)
Message copied by Burnell Blanks on Tue May 02, 2012  5:21 PM ------      Message from: Cassell Clement      Created: Fri Apr 28, 2012  9:34 PM       Please report to patient.  The recent labs are stable. Continue same medication and careful diet. Okay to decrease lipitor to 40 mg as we discussed.

## 2012-05-15 DIAGNOSIS — K900 Celiac disease: Secondary | ICD-10-CM | POA: Diagnosis not present

## 2012-05-15 DIAGNOSIS — H209 Unspecified iridocyclitis: Secondary | ICD-10-CM | POA: Diagnosis not present

## 2012-05-15 DIAGNOSIS — R894 Abnormal immunological findings in specimens from other organs, systems and tissues: Secondary | ICD-10-CM | POA: Diagnosis not present

## 2012-05-17 DIAGNOSIS — Z23 Encounter for immunization: Secondary | ICD-10-CM | POA: Diagnosis not present

## 2012-07-11 DIAGNOSIS — Z85828 Personal history of other malignant neoplasm of skin: Secondary | ICD-10-CM | POA: Diagnosis not present

## 2012-07-11 DIAGNOSIS — L570 Actinic keratosis: Secondary | ICD-10-CM | POA: Diagnosis not present

## 2012-07-11 DIAGNOSIS — L2089 Other atopic dermatitis: Secondary | ICD-10-CM | POA: Diagnosis not present

## 2012-08-09 DIAGNOSIS — C61 Malignant neoplasm of prostate: Secondary | ICD-10-CM | POA: Diagnosis not present

## 2012-08-09 DIAGNOSIS — N401 Enlarged prostate with lower urinary tract symptoms: Secondary | ICD-10-CM | POA: Diagnosis not present

## 2012-08-09 DIAGNOSIS — N138 Other obstructive and reflux uropathy: Secondary | ICD-10-CM | POA: Diagnosis not present

## 2012-08-28 ENCOUNTER — Other Ambulatory Visit: Payer: Medicare Other

## 2012-09-13 DIAGNOSIS — H35359 Cystoid macular degeneration, unspecified eye: Secondary | ICD-10-CM | POA: Diagnosis not present

## 2012-09-19 ENCOUNTER — Other Ambulatory Visit (INDEPENDENT_AMBULATORY_CARE_PROVIDER_SITE_OTHER): Payer: Medicare Other

## 2012-09-19 DIAGNOSIS — I119 Hypertensive heart disease without heart failure: Secondary | ICD-10-CM | POA: Diagnosis not present

## 2012-09-19 DIAGNOSIS — E7800 Pure hypercholesterolemia, unspecified: Secondary | ICD-10-CM | POA: Diagnosis not present

## 2012-09-19 LAB — HEPATIC FUNCTION PANEL
ALT: 21 U/L (ref 0–53)
AST: 20 U/L (ref 0–37)
Albumin: 3.6 g/dL (ref 3.5–5.2)
Alkaline Phosphatase: 59 U/L (ref 39–117)
Bilirubin, Direct: 0.1 mg/dL (ref 0.0–0.3)
Total Bilirubin: 1.1 mg/dL (ref 0.3–1.2)
Total Protein: 6.2 g/dL (ref 6.0–8.3)

## 2012-09-19 LAB — BASIC METABOLIC PANEL
BUN: 13 mg/dL (ref 6–23)
CO2: 28 mEq/L (ref 19–32)
Calcium: 8.6 mg/dL (ref 8.4–10.5)
Chloride: 105 mEq/L (ref 96–112)
Creatinine, Ser: 0.8 mg/dL (ref 0.4–1.5)
GFR: 96.15 mL/min (ref >60.00)
Glucose, Bld: 106 mg/dL — ABNORMAL HIGH (ref 70–99)
Potassium: 4.1 mEq/L (ref 3.5–5.1)
Sodium: 138 mEq/L (ref 135–145)

## 2012-09-19 LAB — LIPID PANEL
Cholesterol: 165 mg/dL (ref 0–200)
HDL: 67.3 mg/dL (ref >39.00)
LDL Cholesterol: 86 mg/dL (ref 0–99)
Total CHOL/HDL Ratio: 2
Triglycerides: 57 mg/dL (ref 0.0–149.0)
VLDL: 11.4 mg/dL (ref 0.0–40.0)

## 2012-09-19 NOTE — Progress Notes (Signed)
Quick Note:  Please report to patient. The recent labs are stable. Continue same medication and careful diet. ______ 

## 2012-09-26 ENCOUNTER — Telehealth: Payer: Self-pay | Admitting: *Deleted

## 2012-09-26 NOTE — Telephone Encounter (Signed)
Advised patient of lab results  

## 2012-09-26 NOTE — Telephone Encounter (Signed)
Message copied by Burnell Blanks on Tue Sep 26, 2012  3:51 PM ------      Message from: Cassell Clement      Created: Tue Sep 19, 2012  1:23 PM       Please report to patient.  The recent labs are stable. Continue same medication and careful diet.

## 2013-02-01 ENCOUNTER — Encounter: Payer: Self-pay | Admitting: Cardiology

## 2013-02-01 ENCOUNTER — Ambulatory Visit (INDEPENDENT_AMBULATORY_CARE_PROVIDER_SITE_OTHER): Payer: Medicare Other | Admitting: Cardiology

## 2013-02-01 VITALS — BP 138/86 | HR 75 | Ht 68.0 in | Wt 197.4 lb

## 2013-02-01 DIAGNOSIS — R972 Elevated prostate specific antigen [PSA]: Secondary | ICD-10-CM | POA: Diagnosis not present

## 2013-02-01 DIAGNOSIS — I119 Hypertensive heart disease without heart failure: Secondary | ICD-10-CM

## 2013-02-01 DIAGNOSIS — B009 Herpesviral infection, unspecified: Secondary | ICD-10-CM

## 2013-02-01 DIAGNOSIS — E7800 Pure hypercholesterolemia, unspecified: Secondary | ICD-10-CM

## 2013-02-01 DIAGNOSIS — B001 Herpesviral vesicular dermatitis: Secondary | ICD-10-CM

## 2013-02-01 LAB — HEPATIC FUNCTION PANEL
ALT: 23 U/L (ref 0–53)
AST: 20 U/L (ref 0–37)
Albumin: 3.9 g/dL (ref 3.5–5.2)
Alkaline Phosphatase: 56 U/L (ref 39–117)
Bilirubin, Direct: 0.1 mg/dL (ref 0.0–0.3)
Total Bilirubin: 1.2 mg/dL (ref 0.3–1.2)
Total Protein: 6.6 g/dL (ref 6.0–8.3)

## 2013-02-01 LAB — LIPID PANEL
Cholesterol: 185 mg/dL (ref 0–200)
HDL: 76.5 mg/dL (ref >39.00)
LDL Cholesterol: 96 mg/dL (ref 0–99)
Total CHOL/HDL Ratio: 2
Triglycerides: 65 mg/dL (ref 0.0–149.0)
VLDL: 13 mg/dL (ref 0.0–40.0)

## 2013-02-01 LAB — BASIC METABOLIC PANEL
BUN: 11 mg/dL (ref 6–23)
CO2: 32 mEq/L (ref 19–32)
Calcium: 9.3 mg/dL (ref 8.4–10.5)
Chloride: 104 mEq/L (ref 96–112)
Creatinine, Ser: 0.8 mg/dL (ref 0.4–1.5)
GFR: 101.69 mL/min (ref >60.00)
Glucose, Bld: 90 mg/dL (ref 70–99)
Potassium: 4.4 mEq/L (ref 3.5–5.1)
Sodium: 138 mEq/L (ref 135–145)

## 2013-02-01 MED ORDER — PENCICLOVIR 1 % EX CREA: TOPICAL_CREAM | CUTANEOUS | Status: DC

## 2013-02-01 NOTE — Assessment & Plan Note (Signed)
Patient has a past history of labile hypertension which has been controlled on low salt diet.  No symptoms of CHF.  No headaches or dizziness.  No palpitations.

## 2013-02-01 NOTE — Patient Instructions (Addendum)
Will obtain labs today and call you with the results (lp/bmet/hfp)  RX FOR DENAVIR SENT TO PHARMACY  Your physician wants you to follow-up in: 6 months with fasting labs (lp/bmet/hfp)  You will receive a reminder letter in the mail two months in advance. If you don't receive a letter, please call our office to schedule the follow-up appointment.

## 2013-02-01 NOTE — Progress Notes (Signed)
Quick Note:  Please report to patient. The recent labs are stable. Continue same medication and careful diet. ______ 

## 2013-02-01 NOTE — Progress Notes (Signed)
Jason Compton Date of Birth:  [birth year] [facility] [address] [place], [place]  [address] [phone]         Fax   [phone]  History of Present Illness: This pleasant 77 year old gentleman is seen for a scheduled followup office visit. He has a past history of essential hypertension and a history of hypercholesterolemia. The patient does not have any history of known ischemic heart disease and had a normal treadmill Cardiolite stress test on 04/18/09. He recently had a lot of GI issues and was found to have gluten intolerance. Now that he is on a gluten-free diet he is doing very well and has regained some of the weight that he has lost. He is not having any GI issues now.  At his last visit we decreased his Lipitor down to 40 mg daily..  Current Outpatient Prescriptions  Medication Sig Dispense Refill  . aspirin 81 MG tablet Take 81 mg by mouth daily.        Marland Kitchen atorvastatin (LIPITOR) 40 MG tablet Take 1 tablet (40 mg total) by mouth daily.  90 tablet  3  . BROMDAY 0.09 % SOLN as directed.      . calcium carbonate (OS-CAL) 600 MG TABS Take 600 mg by mouth daily.        . cholecalciferol (VITAMIN D) 1000 UNITS tablet Take 2,000 Units by mouth daily.       . Multiple Vitamin (MULTIVITAMIN) tablet Take 1 tablet by mouth daily.        . prednisoLONE acetate (PRED FORTE) 1 % ophthalmic suspension as directed.      . ranitidine (ZANTAC) 150 MG tablet Take 150 mg by mouth as needed.      . vitamin C (ASCORBIC ACID) 500 MG tablet Take 500 mg by mouth daily.        Marland Kitchen penciclovir (DENAVIR) 1 % cream Apply topically every 2 (two) hours.  5 g  PRN   No current facility-administered medications for this visit.    Allergies  Allergen Reactions  . Altace (Ramipril)     Patient Active Problem List   Diagnosis Date Noted  . Abdominal pain 07/12/2011  . Benign hypertensive heart disease without heart failure 03/08/2011  . Hypercholesterolemia 03/08/2011  . PSA  elevation 03/08/2011  . Sleep apnea 03/08/2011    History  Smoking status  . Former Smoker  . Quit date: 08/23/2006  Smokeless tobacco  . Not on file    History  Alcohol Use No    Family History  Problem Relation Age of Onset  . Osteoporosis Mother   . Cancer Father   . Hypertension Brother     Review of Systems: Constitutional: no fever chills diaphoresis or fatigue or change in weight.  Head and neck: no hearing loss, no epistaxis, no photophobia or visual disturbance. Respiratory: No cough, shortness of breath or wheezing. Cardiovascular: No chest pain peripheral edema, palpitations. Gastrointestinal: No abdominal distention, no abdominal pain, no change in bowel habits hematochezia or melena. Genitourinary: No dysuria, no frequency, no urgency, no nocturia. Musculoskeletal:No arthralgias, no back pain, no gait disturbance or myalgias. Neurological: No dizziness, no headaches, no numbness, no seizures, no syncope, no weakness, no tremors. Hematologic: No lymphadenopathy, no easy bruising. Psychiatric: No confusion, no hallucinations, no sleep disturbance.    Physical Exam: Filed Vitals:   02/01/13 1109  BP: 138/86  Pulse: 75   the general appearance reveals a well-developed well-nourished gentleman in no distress.The head and neck exam  reveals pupils equal and reactive.  Extraocular movements are full.  There is no scleral icterus.  The mouth and pharynx are normal.  The neck is supple.  The carotids reveal no bruits.  The jugular venous pressure is normal.  The  thyroid is not enlarged.  There is no lymphadenopathy.  The chest is clear to percussion and auscultation.  There are no rales or rhonchi.  Expansion of the chest is symmetrical.  The precordium is quiet.  The first heart sound is normal.  The second heart sound is physiologically split.  There is no murmur gallop rub or click.  There is no abnormal lift or heave.  The abdomen is soft and nontender.  The bowel  sounds are normal.  The liver and spleen are not enlarged.  There are no abdominal masses.  There are no abdominal bruits.  Extremities reveal good pedal pulses.  There is no phlebitis or edema.  There is no cyanosis or clubbing.  Strength is normal and symmetrical in all extremities.  There is no lateralizing weakness.  There are no sensory deficits.  The skin is warm and dry.  There is no rash.  EKG shows normal sinus rhythm and left anterior fascicular block and no ischemic changes.  The EKG is unchanged since 12/13/11   Assessment / Plan: Continue on same medication.  He has been having some fever blisters and we gave him Denavir 1% cream to be applied every 2 hours as directed. We are checking fasting lab work today.  Try to lose weight.  Get more regular exercise.  Recheck in 6 months for followup office visit panel hepatic function panel and basal metabolic panel.

## 2013-02-01 NOTE — Assessment & Plan Note (Signed)
At his last visit we decreased his Lipitor to 40 mg daily because of question of possible effect on his memory.  His memory seems to be improved.  He remains very active and participates as a member of several [employer].  He has not been as active physically and his weight is up.  He has played golf only a few times this year.

## 2013-02-01 NOTE — Assessment & Plan Note (Signed)
He has a history of chronic elevation of his PSA and he is followed with watchful waiting by his urologist Dr. Retta Diones.

## 2013-02-02 ENCOUNTER — Telehealth: Payer: Self-pay | Admitting: *Deleted

## 2013-02-02 NOTE — Telephone Encounter (Signed)
Message copied by Burnell Blanks on Fri Feb 02, 2013  7:43 PM ------      Message from: Cassell Clement      Created: Thu Feb 01, 2013  7:55 PM       Please report to patient.  The recent labs are stable. Continue same medication and careful diet. ------

## 2013-02-02 NOTE — Telephone Encounter (Signed)
Mailed copy of labs and left message to call if any questions  

## 2013-02-07 DIAGNOSIS — N138 Other obstructive and reflux uropathy: Secondary | ICD-10-CM | POA: Diagnosis not present

## 2013-02-07 DIAGNOSIS — N401 Enlarged prostate with lower urinary tract symptoms: Secondary | ICD-10-CM | POA: Diagnosis not present

## 2013-02-07 DIAGNOSIS — C61 Malignant neoplasm of prostate: Secondary | ICD-10-CM | POA: Diagnosis not present

## 2013-04-21 DIAGNOSIS — Z23 Encounter for immunization: Secondary | ICD-10-CM | POA: Diagnosis not present

## 2013-04-30 ENCOUNTER — Telehealth: Payer: Self-pay | Admitting: Cardiology

## 2013-04-30 NOTE — Telephone Encounter (Signed)
Left message for call back.

## 2013-04-30 NOTE — Telephone Encounter (Signed)
New Problem  Pt is going to [place] and wants to know what vaccinations are needed// pt has a list of recommendations to discuss.

## 2013-05-02 NOTE — Telephone Encounter (Signed)
Spoke with patient and gave him [facility] Dept number to call

## 2013-07-16 DIAGNOSIS — Z85828 Personal history of other malignant neoplasm of skin: Secondary | ICD-10-CM | POA: Diagnosis not present

## 2013-07-16 DIAGNOSIS — D1801 Hemangioma of skin and subcutaneous tissue: Secondary | ICD-10-CM | POA: Diagnosis not present

## 2013-07-16 DIAGNOSIS — B079 Viral wart, unspecified: Secondary | ICD-10-CM | POA: Diagnosis not present

## 2013-07-16 DIAGNOSIS — L219 Seborrheic dermatitis, unspecified: Secondary | ICD-10-CM | POA: Diagnosis not present

## 2013-07-16 DIAGNOSIS — L570 Actinic keratosis: Secondary | ICD-10-CM | POA: Diagnosis not present

## 2013-07-16 DIAGNOSIS — L821 Other seborrheic keratosis: Secondary | ICD-10-CM | POA: Diagnosis not present

## 2013-07-22 ENCOUNTER — Other Ambulatory Visit: Payer: Self-pay | Admitting: Cardiology

## 2013-07-30 ENCOUNTER — Other Ambulatory Visit: Payer: Medicare Other

## 2013-07-30 ENCOUNTER — Ambulatory Visit (INDEPENDENT_AMBULATORY_CARE_PROVIDER_SITE_OTHER): Payer: Medicare Other | Admitting: Cardiology

## 2013-07-30 ENCOUNTER — Encounter: Payer: Self-pay | Admitting: Cardiology

## 2013-07-30 VITALS — BP 140/84 | HR 72 | Ht 68.0 in | Wt 205.0 lb

## 2013-07-30 DIAGNOSIS — H9190 Unspecified hearing loss, unspecified ear: Secondary | ICD-10-CM

## 2013-07-30 DIAGNOSIS — H9193 Unspecified hearing loss, bilateral: Secondary | ICD-10-CM

## 2013-07-30 DIAGNOSIS — E7800 Pure hypercholesterolemia, unspecified: Secondary | ICD-10-CM | POA: Diagnosis not present

## 2013-07-30 DIAGNOSIS — R635 Abnormal weight gain: Secondary | ICD-10-CM | POA: Diagnosis not present

## 2013-07-30 DIAGNOSIS — I119 Hypertensive heart disease without heart failure: Secondary | ICD-10-CM | POA: Diagnosis not present

## 2013-07-30 DIAGNOSIS — K5790 Diverticulosis of intestine, part unspecified, without perforation or abscess without bleeding: Secondary | ICD-10-CM | POA: Insufficient documentation

## 2013-07-30 DIAGNOSIS — K900 Celiac disease: Secondary | ICD-10-CM

## 2013-07-30 LAB — HEPATIC FUNCTION PANEL
ALT: 21 U/L (ref 0–53)
AST: 20 U/L (ref 0–37)
Albumin: 4 g/dL (ref 3.5–5.2)
Alkaline Phosphatase: 57 U/L (ref 39–117)
Bilirubin, Direct: 0.1 mg/dL (ref 0.0–0.3)
Total Bilirubin: 1.1 mg/dL (ref 0.3–1.2)
Total Protein: 6.7 g/dL (ref 6.0–8.3)

## 2013-07-30 LAB — BASIC METABOLIC PANEL
BUN: 10 mg/dL (ref 6–23)
CO2: 29 mEq/L (ref 19–32)
Calcium: 8.8 mg/dL (ref 8.4–10.5)
Chloride: 104 mEq/L (ref 96–112)
Creatinine, Ser: 0.8 mg/dL (ref 0.4–1.5)
GFR: 103.06 mL/min (ref >60.00)
Glucose, Bld: 104 mg/dL — ABNORMAL HIGH (ref 70–99)
Potassium: 4.2 mEq/L (ref 3.5–5.1)
Sodium: 138 mEq/L (ref 135–145)

## 2013-07-30 LAB — CBC WITH DIFFERENTIAL/PLATELET
Basophils Absolute: 0 10*3/uL (ref 0.0–0.1)
Basophils Relative: 0.6 % (ref 0.0–3.0)
Eosinophils Absolute: 0.1 10*3/uL (ref 0.0–0.7)
Eosinophils Relative: 2.4 % (ref 0.0–5.0)
HCT: 46.2 % (ref 39.0–52.0)
Hemoglobin: 15.6 g/dL (ref 13.0–17.0)
Lymphocytes Relative: 36.7 % (ref 12.0–46.0)
Lymphs Abs: 2.1 10*3/uL (ref 0.7–4.0)
MCHC: 33.7 g/dL (ref 30.0–36.0)
MCV: 97.5 fl (ref 78.0–100.0)
Monocytes Absolute: 0.5 10*3/uL (ref 0.1–1.0)
Monocytes Relative: 9 % (ref 3.0–12.0)
Neutro Abs: 2.9 10*3/uL (ref 1.4–7.7)
Neutrophils Relative %: 51.3 % (ref 43.0–77.0)
Platelets: 190 10*3/uL (ref 150.0–400.0)
RBC: 4.73 Mil/uL (ref 4.22–5.81)
RDW: 13.7 % (ref 11.5–14.6)
WBC: 5.7 10*3/uL (ref 4.5–10.5)

## 2013-07-30 LAB — LIPID PANEL
Cholesterol: 184 mg/dL (ref 0–200)
HDL: 72.5 mg/dL (ref >39.00)
LDL Cholesterol: 102 mg/dL — ABNORMAL HIGH (ref 0–99)
Total CHOL/HDL Ratio: 3
Triglycerides: 46 mg/dL (ref 0.0–149.0)
VLDL: 9.2 mg/dL (ref 0.0–40.0)

## 2013-07-30 LAB — TSH: TSH: 0.4 u[IU]/mL (ref 0.35–5.50)

## 2013-07-30 NOTE — Progress Notes (Signed)
Quick Note:  Please report to patient. The recent labs are stable. Continue same medication and careful diet. There is no anemia. Thyroid level is normal. ______

## 2013-07-30 NOTE — Assessment & Plan Note (Signed)
The patient has a history of hypercholesterolemia.  He is tolerating Lipitor 40 mg daily.  No myalgias.  Blood work today pending.

## 2013-07-30 NOTE — Progress Notes (Signed)
Jason Compton Date of Birth:  06-21-1936 [id number] [facility] [address] [place], [place]  [address] [phone]         Fax   [phone]  History of Present Illness: This pleasant 77 year old gentleman is seen for a scheduled followup office visit. He has a past history of essential hypertension and a history of hypercholesterolemia. The patient does not have any history of known ischemic heart disease and had a normal treadmill Cardiolite stress test on 04/18/09. He recently had a lot of GI issues and was found to have gluten intolerance. Now that he is on a gluten-free diet he is doing very well and has regained some of the weight that he has lost. He is not having any GI issues now.  At his last visit we decreased his Lipitor down to 40 mg daily.  We're checking lab work today.  The patient has some weight gain and we are also checking TSH and CBC today.  Current Outpatient Prescriptions  Medication Sig Dispense Refill  . aspirin 81 MG tablet Take 81 mg by mouth daily.        Marland Kitchen atorvastatin (LIPITOR) 40 MG tablet TAKE 1 TABLET BY MOUTH DAILY  90 tablet  0  . calcium carbonate (OS-CAL) 600 MG TABS Take 600 mg by mouth daily.        . cholecalciferol (VITAMIN D) 1000 UNITS tablet Take 2,000 Units by mouth daily.       . Multiple Vitamin (MULTIVITAMIN) tablet Take 1 tablet by mouth daily.        . ranitidine (ZANTAC) 150 MG tablet Take 150 mg by mouth as needed.      . vitamin C (ASCORBIC ACID) 500 MG tablet Take 500 mg by mouth daily.         No current facility-administered medications for this visit.    Allergies  Allergen Reactions  . Altace [Ramipril]     Patient Active Problem List   Diagnosis Date Noted  . Celiac disease 07/30/2013  . Abdominal pain 07/12/2011  . Benign hypertensive heart disease without heart failure 03/08/2011  . Hypercholesterolemia 03/08/2011  . PSA elevation 03/08/2011    History  Smoking status  . Former Smoker  . Quit date: 08/23/2006    Smokeless tobacco  . Not on file    History  Alcohol Use No    Family History  Problem Relation Age of Onset  . Osteoporosis Mother   . Cancer Father   . Hypertension Brother     Review of Systems: Constitutional: no fever chills diaphoresis or fatigue or change in weight.  Head and neck: no hearing loss, no epistaxis, no photophobia or visual disturbance. Respiratory: No cough, shortness of breath or wheezing. Cardiovascular: No chest pain peripheral edema, palpitations. Gastrointestinal: No abdominal distention, no abdominal pain, no change in bowel habits hematochezia or melena. Genitourinary: No dysuria, no frequency, no urgency, no nocturia. Musculoskeletal:No arthralgias, no back pain, no gait disturbance or myalgias. Neurological: No dizziness, no headaches, no numbness, no seizures, no syncope, no weakness, no tremors. Hematologic: No lymphadenopathy, no easy bruising. Psychiatric: No confusion, no hallucinations, no sleep disturbance.    Physical Exam: Filed Vitals:   07/30/13 0847  BP: 140/84  Pulse: 72   the general appearance reveals a well-developed well-nourished gentleman in no distress.The head and neck exam reveals pupils equal and reactive.  Extraocular movements are full.  There is no scleral icterus.  The mouth and pharynx are normal.  The neck is  supple.  The carotids reveal no bruits.  The jugular venous pressure is normal.  The  thyroid is not enlarged.  There is no lymphadenopathy.  The chest is clear to percussion and auscultation.  There are no rales or rhonchi.  Expansion of the chest is symmetrical.  The precordium is quiet.  The first heart sound is normal.  The second heart sound is physiologically split.  There is no murmur gallop rub or click.  There is no abnormal lift or heave.  The abdomen is soft and nontender.  The bowel sounds are normal.  The liver and spleen are not enlarged.  There are no abdominal masses.  There are no abdominal bruits.   Extremities reveal good pedal pulses.  There is no phlebitis or edema.  There is no cyanosis or clubbing.  Strength is normal and symmetrical in all extremities.  There is no lateralizing weakness.  There are no sensory deficits.  The skin is warm and dry.  There is no rash.     Assessment / Plan: Continue on same medication.  We are checking fasting lab work today.  Try to lose weight.  Get more regular exercise.  Recheck in 6 months for followup office visit panel hepatic function panel and basal metabolic panel.  And EKG.

## 2013-07-30 NOTE — Patient Instructions (Addendum)
Will obtain labs today and call you with the results (lp/bmet/hfp/cbc/tsh)  Your physician wants you to follow-up in: 6 months with fasting labs (lp/bmet/hfp) and ekg You will receive a reminder letter in the mail two months in advance. If you don't receive a letter, please call our office to schedule the follow-up appointment.   Your physician recommends that you continue on your current medications as directed. Please refer to the Current Medication list given to you today.

## 2013-07-30 NOTE — Assessment & Plan Note (Signed)
GI function has been normal since switching to gluten-free diet

## 2013-07-30 NOTE — Assessment & Plan Note (Signed)
The patient is concerned that his hearing has decreased.  His wife tells him that he is not hearing well.  He would like to get this checked.  I gave him the name of Dr. Haroldine Laws whom he will call.

## 2013-07-30 NOTE — Assessment & Plan Note (Signed)
The patient has not been expressing any chest pain or shortness of breath.  He has not been as physically active over the past several months.  They had a nice cruise around the [place] several months ago.  He gained weight on the cruise and has not lost it.  His weight is up 8 pounds since June.  He intends to try to get more regular exercise and to lose the weight.

## 2013-07-31 ENCOUNTER — Telehealth: Payer: Self-pay | Admitting: *Deleted

## 2013-07-31 NOTE — Telephone Encounter (Signed)
Mailed copy of labs and left message to call if any questions  

## 2013-07-31 NOTE — Telephone Encounter (Signed)
Message copied by Burnell Blanks on Tue Jul 31, 2013  9:13 AM ------      Message from: Cassell Clement      Created: Mon Jul 30, 2013  9:14 PM       Please report to patient.  The recent labs are stable. Continue same medication and careful diet. There is no anemia. Thyroid level is normal. ------

## 2013-08-01 ENCOUNTER — Telehealth: Payer: Self-pay | Admitting: Cardiology

## 2013-08-01 NOTE — Telephone Encounter (Signed)
Advised patient of lab results, no changes in medications

## 2013-08-01 NOTE — Telephone Encounter (Signed)
New Problem:  Pt states he is returning Melinda's call. Pt is requesting a call back.

## 2013-09-13 ENCOUNTER — Other Ambulatory Visit: Payer: Self-pay | Admitting: Urology

## 2013-09-13 DIAGNOSIS — C61 Malignant neoplasm of prostate: Secondary | ICD-10-CM

## 2013-10-01 ENCOUNTER — Ambulatory Visit (HOSPITAL_COMMUNITY): Payer: Medicare Other

## 2013-10-04 ENCOUNTER — Ambulatory Visit (HOSPITAL_COMMUNITY)
Admission: RE | Admit: 2013-10-04 | Discharge: 2013-10-04 | Disposition: A | Discharge status: Home / Self Care | Payer: Medicare Other | Source: Ambulatory Visit | Attending: Urology | Admitting: Urology

## 2013-10-04 DIAGNOSIS — C61 Malignant neoplasm of prostate: Secondary | ICD-10-CM

## 2013-10-04 DIAGNOSIS — R972 Elevated prostate specific antigen [PSA]: Secondary | ICD-10-CM | POA: Diagnosis not present

## 2013-10-04 DIAGNOSIS — N402 Nodular prostate without lower urinary tract symptoms: Secondary | ICD-10-CM | POA: Diagnosis not present

## 2013-10-04 DIAGNOSIS — Z8546 Personal history of malignant neoplasm of prostate: Secondary | ICD-10-CM | POA: Insufficient documentation

## 2013-10-04 LAB — POCT I-STAT CREATININE: Creatinine, Ser: 1 mg/dL (ref 0.50–1.35)

## 2013-10-04 MED ORDER — GADOBENATE DIMEGLUMINE 529 MG/ML IV SOLN
19.0000 mL | Freq: Once | INTRAVENOUS | Status: AC | PRN
  Administered 2013-10-04: 19 mL via INTRAVENOUS

## 2013-10-08 DIAGNOSIS — C61 Malignant neoplasm of prostate: Secondary | ICD-10-CM | POA: Diagnosis not present

## 2013-10-08 DIAGNOSIS — N401 Enlarged prostate with lower urinary tract symptoms: Secondary | ICD-10-CM | POA: Diagnosis not present

## 2013-10-08 DIAGNOSIS — N138 Other obstructive and reflux uropathy: Secondary | ICD-10-CM | POA: Diagnosis not present

## 2013-10-11 DIAGNOSIS — H04129 Dry eye syndrome of unspecified lacrimal gland: Secondary | ICD-10-CM | POA: Diagnosis not present

## 2013-10-28 ENCOUNTER — Other Ambulatory Visit: Payer: Self-pay | Admitting: Cardiology

## 2013-11-09 ENCOUNTER — Telehealth: Payer: Self-pay | Admitting: Cardiology

## 2013-11-09 DIAGNOSIS — R059 Cough, unspecified: Secondary | ICD-10-CM

## 2013-11-09 DIAGNOSIS — R05 Cough: Secondary | ICD-10-CM

## 2013-11-09 DIAGNOSIS — R0981 Nasal congestion: Secondary | ICD-10-CM

## 2013-11-09 MED ORDER — AZITHROMYCIN 250 MG PO TABS: ORAL_TABLET | ORAL | Status: DC

## 2013-11-09 NOTE — Telephone Encounter (Signed)
Trip last week and Monday afternoon started getting sick. Denies fever. Mostly dry, hacking cough. Was able to get up something up about 3 times, unsure if any color. Nasal mucus did have some color. Patient has not been using any Mucinex, recommended he start on that (plain). Patient just doesn't seem to be getting better. Ok with getting something called in if possible. Will forward to  [name] for review

## 2013-11-09 NOTE — Telephone Encounter (Signed)
New problem     Per pt's wife pt need to be seen today because of cough, achy and congestion. Please call pt.

## 2013-11-09 NOTE — Telephone Encounter (Signed)
Advised patient

## 2013-11-09 NOTE — Telephone Encounter (Signed)
Okay to call in [place]

## 2013-12-13 DIAGNOSIS — H04129 Dry eye syndrome of unspecified lacrimal gland: Secondary | ICD-10-CM | POA: Diagnosis not present

## 2014-02-09 ENCOUNTER — Other Ambulatory Visit: Payer: Self-pay | Admitting: Cardiology

## 2014-03-11 ENCOUNTER — Ambulatory Visit
Admission: RE | Admit: 2014-03-11 | Discharge: 2014-03-11 | Disposition: A | Discharge status: Home / Self Care | Payer: Medicare Other | Source: Ambulatory Visit | Attending: Cardiology | Admitting: Cardiology

## 2014-03-11 ENCOUNTER — Other Ambulatory Visit: Payer: Medicare Other

## 2014-03-11 ENCOUNTER — Ambulatory Visit (INDEPENDENT_AMBULATORY_CARE_PROVIDER_SITE_OTHER): Payer: Medicare Other | Admitting: Cardiology

## 2014-03-11 ENCOUNTER — Encounter: Payer: Self-pay | Admitting: Cardiology

## 2014-03-11 VITALS — BP 132/86 | HR 74 | Ht 68.0 in | Wt 202.8 lb

## 2014-03-11 DIAGNOSIS — R05 Cough: Secondary | ICD-10-CM

## 2014-03-11 DIAGNOSIS — E7800 Pure hypercholesterolemia, unspecified: Secondary | ICD-10-CM

## 2014-03-11 DIAGNOSIS — I10 Essential (primary) hypertension: Secondary | ICD-10-CM | POA: Diagnosis not present

## 2014-03-11 DIAGNOSIS — R059 Cough, unspecified: Secondary | ICD-10-CM

## 2014-03-11 DIAGNOSIS — I119 Hypertensive heart disease without heart failure: Secondary | ICD-10-CM | POA: Diagnosis not present

## 2014-03-11 DIAGNOSIS — R509 Fever, unspecified: Secondary | ICD-10-CM | POA: Diagnosis not present

## 2014-03-11 LAB — BASIC METABOLIC PANEL
BUN: 12 mg/dL (ref 6–23)
CO2: 27 mEq/L (ref 19–32)
Calcium: 8.7 mg/dL (ref 8.4–10.5)
Chloride: 100 mEq/L (ref 96–112)
Creatinine, Ser: 0.8 mg/dL (ref 0.4–1.5)
GFR: 106.02 mL/min (ref >60.00)
Glucose, Bld: 102 mg/dL — ABNORMAL HIGH (ref 70–99)
Potassium: 4.3 mEq/L (ref 3.5–5.1)
Sodium: 134 mEq/L — ABNORMAL LOW (ref 135–145)

## 2014-03-11 LAB — LIPID PANEL
Cholesterol: 170 mg/dL (ref 0–200)
HDL: 62.2 mg/dL (ref >39.00)
LDL Cholesterol: 94 mg/dL (ref 0–99)
NonHDL: 107.8
Total CHOL/HDL Ratio: 3
Triglycerides: 70 mg/dL (ref 0.0–149.0)
VLDL: 14 mg/dL (ref 0.0–40.0)

## 2014-03-11 LAB — HEPATIC FUNCTION PANEL
ALT: 25 U/L (ref 0–53)
AST: 21 U/L (ref 0–37)
Albumin: 3.8 g/dL (ref 3.5–5.2)
Alkaline Phosphatase: 58 U/L (ref 39–117)
Bilirubin, Direct: 0.1 mg/dL (ref 0.0–0.3)
Total Bilirubin: 0.9 mg/dL (ref 0.2–1.2)
Total Protein: 6.6 g/dL (ref 6.0–8.3)

## 2014-03-11 NOTE — Assessment & Plan Note (Signed)
The patient has a nonproductive cough.  This started after a trip to [place].  We will check a chest x-ray.  He will try Mucinex.  He has not had any purulent sputum or fever or chills

## 2014-03-11 NOTE — Patient Instructions (Signed)
Will obtain labs today and call you with the results (lp.bmet.hfp)  A chest x-ray takes a picture of the organs and structures inside the chest, including the heart, lungs, and blood vessels. This test can show several things, including, whether the heart is enlarges; whether fluid is building up in the lungs; and whether pacemaker / defibrillator leads are still in place. Jason Compton  START [place] (PLAIN) [place] AS NEEDED  Your physician wants you to follow-up in: 6 months with fasting labs (lp/bmet/hfp) You will receive a reminder letter in the mail two months in advance. If you don't receive a letter, please call our office to schedule the follow-up appointment.

## 2014-03-11 NOTE — Assessment & Plan Note (Signed)
The patient has not been having any side effects from his Lipitor.  Fasting blood work is pending today

## 2014-03-11 NOTE — Progress Notes (Signed)
Quick Note:  Please report to patient. The recent labs are stable. Continue same medication and careful diet. ______ 

## 2014-03-11 NOTE — Progress Notes (Signed)
Jason Compton Date of Birth:  [birth year] [place] [address] [place], [place]  [address] [phone]        Fax   [phone]   History of Present Illness: This pleasant 78 year old gentleman is seen for a scheduled followup office visit. He has a past history of essential hypertension and a history of hypercholesterolemia. The patient does not have any history of known ischemic heart disease and had a normal treadmill Cardiolite stress test on 04/18/09. He recently had a lot of GI issues and was found to have gluten intolerance. Now that he is on a gluten-free diet he is doing very well and has regained some of the weight that he has lost. He is not having any GI issues now. At his last visit we decreased his Lipitor down to 40 mg daily.  Since last visit he has had no new health problems except for a dry nonproductive cough for the past week.  This started after he returned from a airplane trip to [place].  He is not bringing up any sputum.  No fever or chills.  He has been taking Alka-Seltzer.  Current Outpatient Prescriptions  Medication Sig Dispense Refill  . aspirin 81 MG tablet Take 81 mg by mouth daily.        Marland Kitchen atorvastatin (LIPITOR) 40 MG tablet TAKE 1 TABLET BY MOUTH DAILY  90 tablet  0  . calcium carbonate (OS-CAL) 600 MG TABS Take 600 mg by mouth daily.        . cholecalciferol (VITAMIN D) 1000 UNITS tablet Take 2,000 Units by mouth daily.       Marland Kitchen guaiFENesin (MUCINEX) 600 MG 12 hr tablet Take 600 mg by mouth 2 (two) times daily as needed.      . Multiple Vitamin (MULTIVITAMIN) tablet Take 1 tablet by mouth daily.        . ranitidine (ZANTAC) 150 MG tablet Take 150 mg by mouth as needed.      . vitamin C (ASCORBIC ACID) 500 MG tablet Take 500 mg by mouth daily.         No current facility-administered medications for this visit.    Allergies  Allergen Reactions  . Altace [Ramipril]     Patient Active Problem List   Diagnosis Date  Noted  . Celiac disease 07/30/2013  . Hard of hearing 07/30/2013  . Abdominal pain 07/12/2011  . Benign hypertensive heart disease without heart failure 03/08/2011  . Hypercholesterolemia 03/08/2011  . PSA elevation 03/08/2011    History  Smoking status  . Former Smoker  . Quit date: 08/23/2006  Smokeless tobacco  . Not on file    History  Alcohol Use No    Family History  Problem Relation Age of Onset  . Osteoporosis Mother   . Cancer Father   . Hypertension Brother     Review of Systems: Constitutional: no fever chills diaphoresis or fatigue or change in weight.  Head and neck: no hearing loss, no epistaxis, no photophobia or visual disturbance. Respiratory: No cough, shortness of breath or wheezing. Cardiovascular: No chest pain peripheral edema, palpitations. Gastrointestinal: No abdominal distention, no abdominal pain, no change in bowel habits hematochezia or melena. Genitourinary: No dysuria, no frequency, no urgency, no nocturia. Musculoskeletal:No arthralgias, no back pain, no gait disturbance or myalgias. Neurological: No dizziness, no headaches, no numbness, no seizures, no syncope, no weakness, no tremors. Hematologic: No lymphadenopathy, no easy bruising. Psychiatric: No confusion, no hallucinations, no sleep  disturbance.    Physical Exam: Filed Vitals:   03/11/14 0911  BP: 132/86  Pulse:    general appearance feels a well-developed well-nourished mildly overweight gentleman in no acute distress.The head and neck exam reveals pupils equal and reactive.  Extraocular movements are full.  There is no scleral icterus.  The mouth and pharynx are normal.  The neck is supple.  The carotids reveal no bruits.  The jugular venous pressure is normal.  The  thyroid is not enlarged.  There is no lymphadenopathy.  The chest is clear to percussion and auscultation.  There are no rales or rhonchi.  Expansion of the chest is symmetrical.  The precordium is quiet.  The first  heart sound is normal.  The second heart sound is physiologically split.  There is no murmur gallop rub or click.  There is no abnormal lift or heave.  The abdomen is soft and nontender.  The bowel sounds are normal.  The liver and spleen are not enlarged.  There are no abdominal masses.  There are no abdominal bruits.  Extremities reveal good pedal pulses.  There is no phlebitis or edema.  There is no cyanosis or clubbing.  Strength is normal and symmetrical in all extremities.  There is no lateralizing weakness.  There are no sensory deficits.  The skin is warm and dry.  There is no rash.  EKG shows normal sinus rhythm and left axis deviation unchanged from 02/01/13   Assessment / Plan: 1.  Essential hypertension without heart failure 2. Hypercholesterolemia 3. gluten intolerance 4. elevated PSA followed by urology 5. recent cough  Plan: Continue same medication.  Check chest x-ray.  It Mucinex.  Await today's labs.  Continue to watch diet carefully.  Recheck in 6 months for office visit lipid panel hepatic function panel and basal metabolic panel

## 2014-03-11 NOTE — Assessment & Plan Note (Signed)
The patient has not been having any chest pain or shortness of breath.  No dizziness or syncope.  No palpitations.

## 2014-03-15 ENCOUNTER — Telehealth: Payer: Self-pay | Admitting: *Deleted

## 2014-03-15 DIAGNOSIS — R05 Cough: Secondary | ICD-10-CM

## 2014-03-15 DIAGNOSIS — R059 Cough, unspecified: Secondary | ICD-10-CM

## 2014-03-15 MED ORDER — AZITHROMYCIN 250 MG PO TABS: ORAL_TABLET | ORAL | Status: DC

## 2014-03-15 NOTE — Telephone Encounter (Signed)
Z-Pak as directed

## 2014-03-15 NOTE — Telephone Encounter (Signed)
Patient phoned in requesting antibiotic since he continues to have cough, no better and ears are stopped up. Denies fever. Will forward to  [name] for review

## 2014-03-15 NOTE — Telephone Encounter (Signed)
Advised patient

## 2014-05-01 DIAGNOSIS — Z23 Encounter for immunization: Secondary | ICD-10-CM | POA: Diagnosis not present

## 2014-05-14 ENCOUNTER — Other Ambulatory Visit: Payer: Self-pay | Admitting: Cardiology

## 2014-07-06 ENCOUNTER — Ambulatory Visit (INDEPENDENT_AMBULATORY_CARE_PROVIDER_SITE_OTHER): Payer: Medicare Other | Admitting: Family Medicine

## 2014-07-06 VITALS — BP 148/82 | HR 97 | Temp 100.2°F | Resp 16 | Ht 68.0 in | Wt 202.2 lb

## 2014-07-06 DIAGNOSIS — J209 Acute bronchitis, unspecified: Secondary | ICD-10-CM | POA: Diagnosis not present

## 2014-07-06 MED ORDER — LEVOFLOXACIN 500 MG PO TABS: 500.0000 mg | ORAL_TABLET | Freq: Every day | ORAL | Status: DC

## 2014-07-06 MED ORDER — PREDNISONE 20 MG PO TABS: 40.0000 mg | ORAL_TABLET | Freq: Every day | ORAL | Status: DC

## 2014-07-06 NOTE — Patient Instructions (Signed)

## 2014-07-06 NOTE — Progress Notes (Signed)
Subjective:    Patient ID: Jason Compton, male    DOB: 01/19/1936, 78 y.o.   MRN: [id number]  HPI Chief Complaint  Patient presents with  . Cough   This chart was scribed for Robyn Haber, MD by Thea Alken, ED Scribe. This patient was seen in room 14 and the patient's care was started at 11:18 AM.  HPI Comments: Jason Compton is a 78 y.o. male who presents to the [facility] complaining of a cough. Pt states he recently traveled to [place] last week. He reports he came down with a cold 5 days ago with a cough the following day. He reports he now has a hacking productive cough with hoarseness. Pt has been taking mucinex. Pt denies CP.   Pt is an Chief Financial Officer.   Past Medical History  Diagnosis Date  . GERD (gastroesophageal reflux disease)   . Hyperlipidemia   . Hypertension   . Skin cancer   . PVC's (premature ventricular contractions)   . Exogenous obesity   . History of hiatal hernia    Allergies  Allergen Reactions  . Altace [Ramipril]    Prior to Admission medications   Medication Sig Start Date End Date Taking? Authorizing Provider  aspirin 81 MG tablet Take 81 mg by mouth daily.     Yes Historical Provider, MD  atorvastatin (LIPITOR) 40 MG tablet TAKE 1 TABLET BY MOUTH DAILY 05/15/14  Yes Darlin Coco, MD  calcium carbonate (OS-CAL) 600 MG TABS Take 600 mg by mouth daily.     Yes Historical Provider, MD  cholecalciferol (VITAMIN D) 1000 UNITS tablet Take 2,000 Units by mouth daily.    Yes Historical Provider, MD  guaiFENesin (MUCINEX) 600 MG 12 hr tablet Take 600 mg by mouth 2 (two) times daily as needed.   Yes Historical Provider, MD  Multiple Vitamin (MULTIVITAMIN) tablet Take 1 tablet by mouth daily.     Yes Historical Provider, MD  omeprazole (PRILOSEC) 20 MG capsule Take 20 mg by mouth 2 (two) times daily.   Yes Historical Provider, MD  vitamin C (ASCORBIC ACID) 500 MG tablet Take 500 mg by mouth daily.     Yes Historical Provider, MD    Review of Systems  HENT: Positive for voice change.   Respiratory: Positive for cough.   Cardiovascular: Negative for chest pain.   Objective:   Physical Exam  Constitutional: He is oriented to person, place, and time. He appears well-developed and well-nourished. No distress.  HENT:  Head: Atraumatic.  Right Ear: External ear normal.  Left Ear: External ear normal.  Mouth/Throat: Posterior oropharyngeal erythema ( mild) present.  Eyes: Conjunctivae and EOM are normal. Pupils are equal, round, and reactive to light.  Neck: Neck supple.  Cardiovascular: Normal rate.   Pulmonary/Chest: Effort normal. He has rales ( left chest).  Musculoskeletal: Normal range of motion.  Neurological: He is alert and oriented to person, place, and time.  Skin: Skin is warm and dry.  Psychiatric: He has a normal mood and affect. His behavior is normal.  Nursing note and vitals reviewed.   Assessment & Plan:    Meds ordered this encounter  Medications  . omeprazole (PRILOSEC) 20 MG capsule    Sig: Take 20 mg by mouth 2 (two) times daily.   I personally performed the services described in this documentation, which was scribed in my presence. The recorded information has been reviewed and is accurate. Acute bronchitis, unspecified organism - Plan: predniSONE (DELTASONE) 20  MG tablet, levofloxacin (LEVAQUIN) 500 MG tablet  Signed, Robyn Haber, MD

## 2014-07-09 DIAGNOSIS — L570 Actinic keratosis: Secondary | ICD-10-CM | POA: Diagnosis not present

## 2014-07-09 DIAGNOSIS — Z85828 Personal history of other malignant neoplasm of skin: Secondary | ICD-10-CM | POA: Diagnosis not present

## 2014-07-09 DIAGNOSIS — L718 Other rosacea: Secondary | ICD-10-CM | POA: Diagnosis not present

## 2014-07-09 DIAGNOSIS — L918 Other hypertrophic disorders of the skin: Secondary | ICD-10-CM | POA: Diagnosis not present

## 2014-07-09 DIAGNOSIS — L853 Xerosis cutis: Secondary | ICD-10-CM | POA: Diagnosis not present

## 2014-07-09 DIAGNOSIS — D1801 Hemangioma of skin and subcutaneous tissue: Secondary | ICD-10-CM | POA: Diagnosis not present

## 2014-08-12 ENCOUNTER — Other Ambulatory Visit: Payer: Self-pay | Admitting: Cardiology

## 2014-09-10 ENCOUNTER — Encounter: Payer: Self-pay | Admitting: Cardiology

## 2014-09-10 ENCOUNTER — Ambulatory Visit (INDEPENDENT_AMBULATORY_CARE_PROVIDER_SITE_OTHER): Payer: Medicare Other | Admitting: Cardiology

## 2014-09-10 VITALS — BP 136/88 | HR 80 | Ht 68.0 in | Wt 204.0 lb

## 2014-09-10 DIAGNOSIS — R059 Cough, unspecified: Secondary | ICD-10-CM

## 2014-09-10 DIAGNOSIS — R972 Elevated prostate specific antigen [PSA]: Secondary | ICD-10-CM

## 2014-09-10 DIAGNOSIS — E7800 Pure hypercholesterolemia, unspecified: Secondary | ICD-10-CM

## 2014-09-10 DIAGNOSIS — I119 Hypertensive heart disease without heart failure: Secondary | ICD-10-CM | POA: Diagnosis not present

## 2014-09-10 DIAGNOSIS — R05 Cough: Secondary | ICD-10-CM | POA: Diagnosis not present

## 2014-09-10 LAB — HEPATIC FUNCTION PANEL
ALT: 20 U/L (ref 0–53)
AST: 20 U/L (ref 0–37)
Albumin: 3.9 g/dL (ref 3.5–5.2)
Alkaline Phosphatase: 55 U/L (ref 39–117)
Bilirubin, Direct: 0.2 mg/dL (ref 0.0–0.3)
Total Bilirubin: 1 mg/dL (ref 0.2–1.2)
Total Protein: 6.5 g/dL (ref 6.0–8.3)

## 2014-09-10 LAB — BASIC METABOLIC PANEL
BUN: 13 mg/dL (ref 6–23)
CO2: 30 mEq/L (ref 19–32)
Calcium: 9.1 mg/dL (ref 8.4–10.5)
Chloride: 103 mEq/L (ref 96–112)
Creatinine, Ser: 0.79 mg/dL (ref 0.40–1.50)
GFR: 101.25 mL/min (ref >60.00)
Glucose, Bld: 103 mg/dL — ABNORMAL HIGH (ref 70–99)
Potassium: 4 mEq/L (ref 3.5–5.1)
Sodium: 138 mEq/L (ref 135–145)

## 2014-09-10 LAB — LIPID PANEL
Cholesterol: 169 mg/dL (ref 0–200)
HDL: 70.7 mg/dL (ref >39.00)
LDL Cholesterol: 82 mg/dL (ref 0–99)
NonHDL: 98.3
Total CHOL/HDL Ratio: 2
Triglycerides: 82 mg/dL (ref 0.0–149.0)
VLDL: 16.4 mg/dL (ref 0.0–40.0)

## 2014-09-10 NOTE — Progress Notes (Signed)
Cardiology Office Note   Date:  09/10/2014   ID:  Jason Compton, DOB 09-11-1935, MRN [id number]  PCP:  Darlin Coco, MD  Cardiologist:   Darlin Coco, MD   No chief complaint on file.     History of Present Illness: Jason Compton is a 79 y.o. male who presents for followup office visit.Good response to Zpak over Christmas for cough and bronchitis. Added Prilosec back. No chest pain. Walking for exercise. Stairs at home. Going to [place] for a family wedding. Later a cruise from [place]. This pleasant 79 year old gentleman is seen for a scheduled followup office visit. He has a past history of essential hypertension and a history of hypercholesterolemia. The patient does not have any history of known ischemic heart disease and had a normal treadmill Cardiolite stress test on 04/18/09. He recently had a lot of GI issues and was found to have gluten intolerance. Now that he is on a gluten-free diet he is doing very well and has regained some of the weight that he has lost. He is not having any GI issues now.    Past Medical History  Diagnosis Date  . GERD (gastroesophageal reflux disease)   . Hyperlipidemia   . Hypertension   . Skin cancer   . PVC's (premature ventricular contractions)   . Exogenous obesity   . History of hiatal hernia     Past Surgical History  Procedure Laterality Date  . Cataract extraction    . Cardiovascular stress test  04/18/2009    NORMAL     Current Outpatient Prescriptions  Medication Sig Dispense Refill  . aspirin 81 MG tablet Take 81 mg by mouth daily.      Marland Kitchen atorvastatin (LIPITOR) 40 MG tablet TAKE 1 TABLET BY MOUTH EVERY DAY 90 tablet 0  . calcium carbonate (OS-CAL) 600 MG TABS Take 600 mg by mouth daily.      . cholecalciferol (VITAMIN D) 1000 UNITS tablet Take 1,000 Units by mouth daily.     . Multiple Vitamin (MULTIVITAMIN) tablet Take 1 tablet by mouth daily.      Marland Kitchen omeprazole (PRILOSEC) 20 MG capsule Take 20 mg by mouth  2 (two) times daily.    . vitamin C (ASCORBIC ACID) 500 MG tablet Take 500 mg by mouth daily.       No current facility-administered medications for this visit.    Allergies:   Altace    Social History:  The patient  reports that he quit smoking about 8 years ago. He has never used smokeless tobacco. He reports that he drinks alcohol. He reports that he does not use illicit drugs.   Family History:  The patient's family history includes Cancer in his father; Hypertension in his brother; Osteoporosis in his mother.    ROS:  Please see the history of present illness.   Otherwise, review of systems are positive for none.   All other systems are reviewed and negative.    PHYSICAL EXAM: VS:  BP 136/88 mmHg  Pulse 80  Ht 5\' 8"  (1.727 m)  Wt 204 lb (92.534 kg)  BMI 31.03 kg/m2 , BMI Body mass index is 31.03 kg/(m^2). GEN: Well nourished, well developed, in no acute distress HEENT: normal Neck: no JVD, carotid bruits, or masses Cardiac: Regular rhythm.; no murmurs, rubs, or gallops,no edema  Respiratory:  clear to auscultation bilaterally, normal work of breathing GI: soft, nontender, nondistended, + BS MS: no deformity or atrophy Skin: warm and dry, no rash Neuro:  Strength and sensation are intact Psych: euthymic mood, full affect   EKG:  Not ordered today.  We will plan to get one next time   Recent Labs: 09/10/2014: ALT 20; BUN 13; Creatinine 0.79; Potassium 4.0; Sodium 138    Lipid Panel    Component Value Date/Time   CHOL 169 09/10/2014 0905   TRIG 82.0 09/10/2014 0905   HDL 70.70 09/10/2014 0905   CHOLHDL 2 09/10/2014 0905   VLDL 16.4 09/10/2014 0905   LDLCALC 82 09/10/2014 0905   LDLDIRECT 189.7 12/10/2011 0901      Wt Readings from Last 3 Encounters:  09/10/14 204 lb (92.534 kg)  07/06/14 202 lb 4 oz (91.74 kg)  03/11/14 202 lb 12.8 oz (91.989 kg)      Other studies Reviewed: Additional studies/ records that were reviewed today include: None Review of  the above records demonstrates: None   ASSESSMENT AND PLAN:  1. Essential hypertension without heart failure 2. Hypercholesterolemia 3. gluten intolerance 4. elevated PSA followed by urology Dr. Mar Daring   Current medicines are reviewed at length with the patient today.  The patient does not have concerns regarding medicines.  The following changes have been made:  no change  Labs/ tests ordered today include: Lipid panel, hepatic function panel, and basal metabolic panel.   No orders of the defined types were placed in this encounter.     Disposition:   FU with [name] in 6 months for office visit, EKG, lipid panel, hepatic function panel, and basal metabolic panel   Signed, Darlin Coco, MD  09/10/2014 5:55 PM    [facility] [place], [place], [place]  [address] Phone: [phone]; Fax: [phone]

## 2014-09-10 NOTE — Progress Notes (Signed)
Quick Note:  Please report to patient. The recent labs are stable. Continue same medication and careful diet. ______ 

## 2014-09-10 NOTE — Patient Instructions (Addendum)
Will obtain labs today and call you with the results (lp/bmet/hfp)  Your physician recommends that you continue on your current medications as directed. Please refer to the Current Medication list given to you today.  Your physician wants you to follow-up in: 6 months with fasting labs (lp/bmet/hfp) and ekg  You will receive a reminder letter in the mail two months in advance. If you don't receive a letter, please call our office to schedule the follow-up appointment.

## 2014-11-12 ENCOUNTER — Other Ambulatory Visit: Payer: Self-pay | Admitting: Cardiology

## 2015-01-02 DIAGNOSIS — C61 Malignant neoplasm of prostate: Secondary | ICD-10-CM | POA: Diagnosis not present

## 2015-01-10 DIAGNOSIS — C61 Malignant neoplasm of prostate: Secondary | ICD-10-CM | POA: Diagnosis not present

## 2015-01-10 DIAGNOSIS — N138 Other obstructive and reflux uropathy: Secondary | ICD-10-CM | POA: Diagnosis not present

## 2015-01-10 DIAGNOSIS — N401 Enlarged prostate with lower urinary tract symptoms: Secondary | ICD-10-CM | POA: Diagnosis not present

## 2015-02-12 DIAGNOSIS — H01005 Unspecified blepharitis left lower eyelid: Secondary | ICD-10-CM | POA: Diagnosis not present

## 2015-02-12 DIAGNOSIS — H01001 Unspecified blepharitis right upper eyelid: Secondary | ICD-10-CM | POA: Diagnosis not present

## 2015-02-12 DIAGNOSIS — H524 Presbyopia: Secondary | ICD-10-CM | POA: Diagnosis not present

## 2015-02-12 DIAGNOSIS — H01002 Unspecified blepharitis right lower eyelid: Secondary | ICD-10-CM | POA: Diagnosis not present

## 2015-02-12 DIAGNOSIS — H01004 Unspecified blepharitis left upper eyelid: Secondary | ICD-10-CM | POA: Diagnosis not present

## 2015-02-12 DIAGNOSIS — H04123 Dry eye syndrome of bilateral lacrimal glands: Secondary | ICD-10-CM | POA: Diagnosis not present

## 2015-02-17 ENCOUNTER — Other Ambulatory Visit: Payer: Self-pay

## 2015-04-30 ENCOUNTER — Other Ambulatory Visit: Payer: Self-pay | Admitting: Cardiology

## 2015-05-03 DIAGNOSIS — Z23 Encounter for immunization: Secondary | ICD-10-CM | POA: Diagnosis not present

## 2015-05-06 DIAGNOSIS — L570 Actinic keratosis: Secondary | ICD-10-CM | POA: Diagnosis not present

## 2015-05-06 DIAGNOSIS — D485 Neoplasm of uncertain behavior of skin: Secondary | ICD-10-CM | POA: Diagnosis not present

## 2015-05-06 DIAGNOSIS — B078 Other viral warts: Secondary | ICD-10-CM | POA: Diagnosis not present

## 2015-05-06 DIAGNOSIS — Z85828 Personal history of other malignant neoplasm of skin: Secondary | ICD-10-CM | POA: Diagnosis not present

## 2015-05-06 DIAGNOSIS — L820 Inflamed seborrheic keratosis: Secondary | ICD-10-CM | POA: Diagnosis not present

## 2015-06-24 DIAGNOSIS — B351 Tinea unguium: Secondary | ICD-10-CM | POA: Diagnosis not present

## 2015-06-24 DIAGNOSIS — Z79899 Other long term (current) drug therapy: Secondary | ICD-10-CM | POA: Diagnosis not present

## 2015-06-24 DIAGNOSIS — Z85828 Personal history of other malignant neoplasm of skin: Secondary | ICD-10-CM | POA: Diagnosis not present

## 2015-06-24 DIAGNOSIS — B078 Other viral warts: Secondary | ICD-10-CM | POA: Diagnosis not present

## 2015-06-24 DIAGNOSIS — D1801 Hemangioma of skin and subcutaneous tissue: Secondary | ICD-10-CM | POA: Diagnosis not present

## 2015-06-24 DIAGNOSIS — L570 Actinic keratosis: Secondary | ICD-10-CM | POA: Diagnosis not present

## 2015-06-24 DIAGNOSIS — D225 Melanocytic nevi of trunk: Secondary | ICD-10-CM | POA: Diagnosis not present

## 2015-06-24 DIAGNOSIS — L738 Other specified follicular disorders: Secondary | ICD-10-CM | POA: Diagnosis not present

## 2015-06-24 DIAGNOSIS — L821 Other seborrheic keratosis: Secondary | ICD-10-CM | POA: Diagnosis not present

## 2015-07-30 ENCOUNTER — Other Ambulatory Visit: Payer: Self-pay | Admitting: Cardiology

## 2015-08-25 ENCOUNTER — Other Ambulatory Visit: Payer: Self-pay | Admitting: Cardiology

## 2015-10-01 ENCOUNTER — Telehealth: Payer: Self-pay | Admitting: Cardiology

## 2015-10-01 ENCOUNTER — Other Ambulatory Visit: Payer: Self-pay | Admitting: Cardiology

## 2015-10-01 DIAGNOSIS — E7800 Pure hypercholesterolemia, unspecified: Secondary | ICD-10-CM

## 2015-10-01 DIAGNOSIS — R972 Elevated prostate specific antigen [PSA]: Secondary | ICD-10-CM

## 2015-10-01 NOTE — Telephone Encounter (Signed)
Left message to call back  

## 2015-10-01 NOTE — Telephone Encounter (Signed)
Calling to see whether he needs to have labs .Jason Compton

## 2015-10-02 ENCOUNTER — Other Ambulatory Visit: Payer: Self-pay | Admitting: *Deleted

## 2015-10-02 MED ORDER — ATORVASTATIN CALCIUM 40 MG PO TABS: 40.0000 mg | ORAL_TABLET | Freq: Every day | ORAL | Status: DC

## 2015-10-02 NOTE — Telephone Encounter (Signed)
Follow up ° ° ° ° ° °Returning a call to the nurse °

## 2015-10-02 NOTE — Telephone Encounter (Signed)
Spoke with patient and he will come tomorrow for his labs and ov next week

## 2015-10-03 ENCOUNTER — Other Ambulatory Visit (INDEPENDENT_AMBULATORY_CARE_PROVIDER_SITE_OTHER): Payer: Medicare Other | Admitting: *Deleted

## 2015-10-03 DIAGNOSIS — E7800 Pure hypercholesterolemia, unspecified: Secondary | ICD-10-CM

## 2015-10-03 DIAGNOSIS — R972 Elevated prostate specific antigen [PSA]: Secondary | ICD-10-CM | POA: Diagnosis not present

## 2015-10-03 LAB — HEPATIC FUNCTION PANEL
ALT: 21 U/L (ref 9–46)
AST: 19 U/L (ref 10–35)
Albumin: 3.6 g/dL (ref 3.6–5.1)
Alkaline Phosphatase: 63 U/L (ref 40–115)
Bilirubin, Direct: 0.2 mg/dL (ref <=0.2)
Indirect Bilirubin: 0.6 mg/dL (ref 0.2–1.2)
Total Bilirubin: 0.8 mg/dL (ref 0.2–1.2)
Total Protein: 6 g/dL — ABNORMAL LOW (ref 6.1–8.1)

## 2015-10-03 LAB — LIPID PANEL
Cholesterol: 167 mg/dL (ref 125–200)
HDL: 69 mg/dL (ref >=40)
LDL Cholesterol: 87 mg/dL (ref <130)
Total CHOL/HDL Ratio: 2.4 Ratio (ref <=5.0)
Triglycerides: 57 mg/dL (ref <150)
VLDL: 11 mg/dL (ref <30)

## 2015-10-03 LAB — BASIC METABOLIC PANEL
BUN: 11 mg/dL (ref 7–25)
CO2: 26 mmol/L (ref 20–31)
Calcium: 8.7 mg/dL (ref 8.6–10.3)
Chloride: 100 mmol/L (ref 98–110)
Creat: 0.8 mg/dL (ref 0.70–1.18)
Glucose, Bld: 98 mg/dL (ref 65–99)
Potassium: 4.6 mmol/L (ref 3.5–5.3)
Sodium: 134 mmol/L — ABNORMAL LOW (ref 135–146)

## 2015-10-03 NOTE — Addendum Note (Signed)
Addended by: Eulis Foster on: 10/03/2015 08:33 AM   Modules accepted: Orders

## 2015-10-04 LAB — PSA: PSA: 6.98 ng/mL — ABNORMAL HIGH (ref <=4.00)

## 2015-10-04 NOTE — Progress Notes (Signed)
Quick Note:  Please make copy of labs for patient visit. ______ 

## 2015-10-08 ENCOUNTER — Ambulatory Visit (INDEPENDENT_AMBULATORY_CARE_PROVIDER_SITE_OTHER): Payer: Medicare Other | Admitting: Cardiology

## 2015-10-08 ENCOUNTER — Encounter: Payer: Self-pay | Admitting: Cardiology

## 2015-10-08 VITALS — BP 140/70 | HR 80 | Ht 68.0 in | Wt 212.4 lb

## 2015-10-08 DIAGNOSIS — I119 Hypertensive heart disease without heart failure: Secondary | ICD-10-CM | POA: Diagnosis not present

## 2015-10-08 NOTE — Progress Notes (Signed)
Cardiology Office Note   Date:  10/08/2015   ID:  Jason Compton, DOB [birth year], MRN [id number]  PCP:  No PCP Per Patient  Cardiologist: Darlin Coco MD  Chief Complaint  Patient presents with  . scheduled follow up      History of Present Illness: Jason Compton is a 80 y.o. male who presents for scheduled 6 month follow-up visit  This pleasant 80 year old gentleman is seen for a scheduled followup office visit. He has a past history of essential hypertension and a history of hypercholesterolemia. The patient does not have any history of known ischemic heart disease and had a normal treadmill Cardiolite stress test on 04/18/09. He recently had a lot of GI issues and was found to have gluten intolerance. Now that he is on a gluten-free diet he is doing very well and has regained some of the weight that he has lost. He is not having any GI issues now.  He still has mild reflux but has been able to get by with just a single omeprazole 20 mg tablet daily. His main exercise is walking.  He has not played any golf recently.  He and his wife have been on several cruises recently. The patient denies any chest pain or shortness of breath.  No dizziness or syncope.  No palpitations.  Past Medical History  Diagnosis Date  . GERD (gastroesophageal reflux disease)   . Hyperlipidemia   . Hypertension   . Skin cancer   . PVC's (premature ventricular contractions)   . Exogenous obesity   . History of hiatal hernia     Past Surgical History  Procedure Laterality Date  . Cataract extraction    . Cardiovascular stress test  04/18/2009    NORMAL     Current Outpatient Prescriptions  Medication Sig Dispense Refill  . aspirin 81 MG tablet Take 81 mg by mouth daily.      Marland Kitchen atorvastatin (LIPITOR) 40 MG tablet Take 1 tablet (40 mg total) by mouth daily. FOLLOW UP NEEDED 30 tablet 11  . cholecalciferol (VITAMIN D) 1000 UNITS tablet Take 1,000 Units by mouth daily.     . Multiple  Vitamin (MULTIVITAMIN) tablet Take 1 tablet by mouth daily.      Marland Kitchen omeprazole (PRILOSEC) 20 MG capsule Take 20 mg by mouth daily.     . vitamin C (ASCORBIC ACID) 500 MG tablet Take 500 mg by mouth daily.       No current facility-administered medications for this visit.    Allergies:   Altace    Social History:  The patient  reports that he quit smoking about 9 years ago. He has never used smokeless tobacco. He reports that he drinks alcohol. He reports that he does not use illicit drugs.   Family History:  The patient's family history includes Cancer in his father; Hypertension in his brother; Osteoporosis in his mother.    ROS:  Please see the history of present illness.   Otherwise, review of systems are positive for none.   All other systems are reviewed and negative.    PHYSICAL EXAM: VS:  BP 140/70 mmHg  Pulse 80  Ht 5\' 8"  (1.727 m)  Wt 212 lb 6.4 oz (96.344 kg)  BMI 32.30 kg/m2 , BMI Body mass index is 32.3 kg/(m^2). GEN: Well nourished, well developed, in no acute distress HEENT: normal Neck: no JVD, carotid bruits, or masses Cardiac: RRR; no murmurs, rubs, or gallops,no edema  Respiratory:  clear to auscultation  bilaterally, normal work of breathing GI: soft, nontender, nondistended, + BS MS: no deformity or atrophy Skin: warm and dry, no rash Neuro:  Strength and sensation are intact Psych: euthymic mood, full affect   EKG:  EKG is ordered today. The ekg ordered today demonstrates normal sinus rhythm at 81 bpm.  Left anterior fascicular block.  Since prior tracing of 03/11/14, no significant change   Recent Labs: 10/03/2015: ALT 21; BUN 11; Creat 0.80; Potassium 4.6; Sodium 134*    Lipid Panel    Component Value Date/Time   CHOL 167 10/03/2015 0833   TRIG 57 10/03/2015 0833   HDL 69 10/03/2015 0833   CHOLHDL 2.4 10/03/2015 0833   VLDL 11 10/03/2015 0833   LDLCALC 87 10/03/2015 0833   LDLDIRECT 189.7 12/10/2011 0901      Wt Readings from Last 3  Encounters:  10/08/15 212 lb 6.4 oz (96.344 kg)  09/10/14 204 lb (92.534 kg)  07/06/14 202 lb 4 oz (91.74 kg)       ASSESSMENT AND PLAN:  1. Essential hypertension without heart failure 2. Hypercholesterolemia 3. gluten intolerance 4. elevated PSA followed by urology Dr. Mar Daring.  PSA level is stable in the range of 6.  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   Current medicines are reviewed at length with the patient today.  The patient does not have concerns regarding medicines.  The following changes have been made:  no change  Labs/ tests ordered today include:   Orders Placed This Encounter  Procedures  . EKG 12-Lead    Disposition: Continue current medication.  Work harder on weight loss.  Continue prudent gluten free diet.  Recheck in 6 months for office visit with Dr. Oval Linsey.  Berna Spare MD 10/08/2015 2:27 PM    [address], [place], [place]  [address] Phone: [phone]; Fax: [phone]

## 2015-10-08 NOTE — Patient Instructions (Signed)
Medication Instructions:  Your physician recommends that you continue on your current medications as directed. Please refer to the Current Medication list given to you today.  Labwork: none  Testing/Procedures: none  Follow-Up: Your physician wants you to follow-up in: 6 month ov with Dr Oval Linsey at the [facility] office  You will receive a reminder letter in the mail two months in advance. If you don't receive a letter, please call our office to schedule the follow-up appointment.  If you need a refill on your cardiac medications before your next appointment, please call your pharmacy.

## 2015-11-19 DIAGNOSIS — H903 Sensorineural hearing loss, bilateral: Secondary | ICD-10-CM | POA: Diagnosis not present

## 2016-02-05 DIAGNOSIS — L820 Inflamed seborrheic keratosis: Secondary | ICD-10-CM | POA: Diagnosis not present

## 2016-02-05 DIAGNOSIS — Z85828 Personal history of other malignant neoplasm of skin: Secondary | ICD-10-CM | POA: Diagnosis not present

## 2016-02-05 DIAGNOSIS — L918 Other hypertrophic disorders of the skin: Secondary | ICD-10-CM | POA: Diagnosis not present

## 2016-02-05 DIAGNOSIS — B079 Viral wart, unspecified: Secondary | ICD-10-CM | POA: Diagnosis not present

## 2016-02-05 DIAGNOSIS — L245 Irritant contact dermatitis due to other chemical products: Secondary | ICD-10-CM | POA: Diagnosis not present

## 2016-02-05 DIAGNOSIS — D485 Neoplasm of uncertain behavior of skin: Secondary | ICD-10-CM | POA: Diagnosis not present

## 2016-02-05 DIAGNOSIS — D171 Benign lipomatous neoplasm of skin and subcutaneous tissue of trunk: Secondary | ICD-10-CM | POA: Diagnosis not present

## 2016-02-26 DIAGNOSIS — H524 Presbyopia: Secondary | ICD-10-CM | POA: Diagnosis not present

## 2016-02-26 DIAGNOSIS — H04123 Dry eye syndrome of bilateral lacrimal glands: Secondary | ICD-10-CM | POA: Diagnosis not present

## 2016-03-30 ENCOUNTER — Ambulatory Visit (INDEPENDENT_AMBULATORY_CARE_PROVIDER_SITE_OTHER): Payer: Medicare Other | Admitting: Cardiovascular Disease

## 2016-03-30 ENCOUNTER — Encounter: Payer: Self-pay | Admitting: Cardiovascular Disease

## 2016-03-30 VITALS — BP 166/86 | HR 71 | Ht 68.0 in | Wt 212.0 lb

## 2016-03-30 DIAGNOSIS — I10 Essential (primary) hypertension: Secondary | ICD-10-CM

## 2016-03-30 DIAGNOSIS — E785 Hyperlipidemia, unspecified: Secondary | ICD-10-CM

## 2016-03-30 MED ORDER — AMLODIPINE BESYLATE 5 MG PO TABS: 5.0000 mg | ORAL_TABLET | Freq: Every day | ORAL | 5 refills | Status: DC

## 2016-03-30 NOTE — Patient Instructions (Signed)
Medication Instructions:  START AMLODIPINE 5 MG DAILY  Labwork: NONE  Testing/Procedures: NONE  Follow-Up: Your physician recommends that you schedule a follow-up appointment in: [place]  If you need a refill on your cardiac medications before your next appointment, please call your pharmacy.

## 2016-03-30 NOTE — Progress Notes (Signed)
Cardiology Office Note   Date:  03/30/2016   ID:  Jason Compton, DOB 1935/11/13, MRN [id number]  PCP:  No PCP Per Patient  Cardiologist:   Jason Latch, MD   No chief complaint on file.     History of Present Illness: Jason Compton is a 80 y.o. male with hypertension and hyperlipidemia who presents for follow up.  Jason Compton was previously a patient of dr. Mare Compton.  He has been doing well and denies any chest pain or shortness of breath.  He does not occasional exertional dyspnea when walking up stairs, tho He notes that his blood pressure was also elevated when saw his opthalmologist last week.  At that time his systolic BP was 123XX123 mmHg.  He denies headache or visual changes.   Jason Compton was diagnosed with Celiac disease and now has a gluten free diet.  He no longer has abdominal pain or discomfort.  This diagnosis occurred around 214 and he lost 50 lb.  However, he has slowly been gaining the weight back.  He walks for 2 miles most days of the week.  He has not noted any lower extremity edema, orthopnea or PND.  Past Medical History:  Diagnosis Date  . Exogenous obesity   . GERD (gastroesophageal reflux disease)   . History of hiatal hernia   . Hyperlipidemia   . Hypertension   . PVC's (premature ventricular contractions)   . Skin cancer     Past Surgical History:  Procedure Laterality Date  . CARDIOVASCULAR STRESS TEST  04/18/2009   NORMAL  . CATARACT EXTRACTION       Current Outpatient Prescriptions  Medication Sig Dispense Refill  . aspirin 81 MG tablet Take 81 mg by mouth daily.      Marland Kitchen atorvastatin (LIPITOR) 40 MG tablet Take 1 tablet (40 mg total) by mouth daily. FOLLOW UP NEEDED 30 tablet 11  . cholecalciferol (VITAMIN D) 1000 UNITS tablet Take 1,000 Units by mouth daily.     . Multiple Vitamin (MULTIVITAMIN) tablet Take 1 tablet by mouth daily.      Marland Kitchen omeprazole (PRILOSEC) 20 MG capsule Take 20 mg by mouth daily.     . vitamin C (ASCORBIC  ACID) 500 MG tablet Take 500 mg by mouth daily.      Marland Kitchen amLODipine (NORVASC) 5 MG tablet Take 1 tablet (5 mg total) by mouth daily. 30 tablet 5   No current facility-administered medications for this visit.     Allergies:   Altace [ramipril]    Social History:  The patient  reports that he quit smoking about 9 years ago. He has never used smokeless tobacco. He reports that he drinks alcohol. He reports that he does not use drugs.   Family History:  The patient's family history includes Cancer in his father; Hypertension in his brother; Osteoporosis in his mother.    ROS:  Please see the history of present illness.   Otherwise, review of systems are positive for none.   All other systems are reviewed and negative.    PHYSICAL EXAM: VS:  BP (!) 166/86   Pulse 71   Ht 5\' 8"  (1.727 m)   Wt 212 lb (96.2 kg)   BMI 32.23 kg/m  , BMI Body mass index is 32.23 kg/m. GENERAL:  Well appearing HEENT:  Pupils equal round and reactive, fundi not visualized, oral mucosa unremarkable NECK:  No jugular venous distention, waveform within normal limits, carotid upstroke brisk and symmetric, no bruits,  no thyromegaly LYMPHATICS:  No cervical adenopathy LUNGS:  Clear to auscultation bilaterally HEART:  RRR.  PMI not displaced or sustained,S1 and S2 within normal limits, no S3, no S4, no clicks, no rubs, no murmurs ABD:  Flat, positive bowel sounds normal in frequency in pitch, no bruits, no rebound, no guarding, no midline pulsatile mass, no hepatomegaly, no splenomegaly EXT:  2 plus pulses throughout, no edema, no cyanosis no clubbing SKIN:  No rashes no nodules NEURO:  Cranial nerves II through XII grossly intact, motor grossly intact throughout PSYCH:  Cognitively intact, oriented to person place and time   EKG:  EKG is ordered today. The ekg ordered today demonstrates sinus rhythm rate 71 bpm.  Cannot rule out prior inferior infarct.  Left axis deviation.  Unchanged from prior.     Recent  Labs: 10/03/2015: ALT 21; BUN 11; Creat 0.80; Potassium 4.6; Sodium 134    Lipid Panel    Component Value Date/Time   CHOL 167 10/03/2015 0833   TRIG 57 10/03/2015 0833   HDL 69 10/03/2015 0833   CHOLHDL 2.4 10/03/2015 0833   VLDL 11 10/03/2015 0833   LDLCALC 87 10/03/2015 0833   LDLDIRECT 189.7 12/10/2011 0901      Wt Readings from Last 3 Encounters:  03/30/16 212 lb (96.2 kg)  10/08/15 212 lb 6.4 oz (96.3 kg)  09/10/14 204 lb (92.5 kg)      ASSESSMENT AND PLAN:  # Hypertension: Jason Compton's blood pressure has been elevated on more than one occasion.  We will add amlodipine 5 mg daily.  He will start checking his BP regularly and bring a log to his next appointment.  # Hyperlipidemia.  LDL 87 09/2015.  Continue atorvastatin.  # CV Disease Prevention: Continue aspirin 81 mg daily.   Current medicines are reviewed at length with the patient today.  The patient does not have concerns regarding medicines.  The following changes have been made:  Start amlodipine 5 mg daily  Labs/ tests ordered today include:  No orders of the defined types were placed in this encounter.    Disposition:   FU with Jason Picotte C. Oval Linsey, MD, [facility] in 1 month.   This note was written with the assistance of speech recognition software.  Please excuse any transcriptional errors.  Signed, Jason Mcanelly C. Oval Linsey, MD, [facility]  03/30/2016 4:26 PM    [facility]

## 2016-03-31 NOTE — Addendum Note (Signed)
Addended by: Alvina Filbert B on: 03/31/2016 09:31 AM   Modules accepted: Orders

## 2016-05-09 NOTE — Progress Notes (Signed)
Cardiology Office Note   Date:  05/10/2016   ID:  Jason Compton, DOB November 15, 1935, MRN [id number]  PCP:  No PCP Per Compton  Cardiologist:   Skeet Latch, MD   Chief Complaint  Compton presents with  . Follow-up    1 Month f/u; Pt states no Sx or concerns.       History of Present Illness: Jason Compton is a 80 y.o. male with hypertension, hyperlipidemia and Celiac disease who presents for follow up.  Jason Compton was previously a Compton of [name].  He has been doing well and denies any chest pain or shortness of breath.  He does not occasional exertional dyspnea when walking up stairs, tho He notes that his blood pressure was also elevated when saw his opthalmologist last week.  At that time his systolic BP was 123XX123 mmHg. At his last appointment 03/2016 amlodipine was added to his medical regimen.    Jason Compton has been doing well.  He has not noted any chest pain or shortness of breath. He does not check his blood pressure regularly. He reports that he has tolerated Jason amlodipine well and has not noticed any side effects. He has been walking regularly and tries to get 2-4 miles of walking each day. He typically gets around 6000-7000 steps. He and his wife recently went on a cruise to [place] and enjoyed quite a bit. His next trip is to [place] in February.   Past Medical History:  Diagnosis Date  . Exogenous obesity   . GERD (gastroesophageal reflux disease)   . History of hiatal hernia   . Hyperlipidemia   . Hypertension   . PVC's (premature ventricular contractions)   . Skin cancer     Past Surgical History:  Procedure Laterality Date  . CARDIOVASCULAR STRESS TEST  04/18/2009   NORMAL  . CATARACT EXTRACTION       Current Outpatient Prescriptions  Medication Sig Dispense Refill  . amLODipine (NORVASC) 5 MG tablet Take 1 tablet (5 mg total) by mouth daily. 30 tablet 5  . aspirin 81 MG tablet Take 81 mg by mouth daily.      Marland Kitchen atorvastatin (LIPITOR)  40 MG tablet Take 1 tablet (40 mg total) by mouth daily. FOLLOW UP NEEDED 30 tablet 11  . cholecalciferol (VITAMIN D) 1000 UNITS tablet Take 1,000 Units by mouth daily.     . Multiple Vitamin (MULTIVITAMIN) tablet Take 1 tablet by mouth daily.      Marland Kitchen omeprazole (PRILOSEC) 20 MG capsule Take 20 mg by mouth daily.     . vitamin C (ASCORBIC ACID) 500 MG tablet Take 500 mg by mouth daily.       No current facility-administered medications for this visit.     Allergies:   Altace [ramipril]    Social History:  Jason Compton  reports that he quit smoking about 9 years ago. He has never used smokeless tobacco. He reports that he drinks alcohol. He reports that he does not use drugs.   Family History:  Jason Compton's family history includes Cancer in his father; Hypertension in his brother; Osteoporosis in his mother.    ROS:  Please see Jason history of present illness.   Otherwise, review of systems are positive for none.   All other systems are reviewed and negative.    PHYSICAL EXAM: VS:  BP (!) 145/85   Pulse 71   Ht 5\' 8"  (1.727 m)   Wt 212 lb 9.6 oz (96.4  kg)   BMI 32.33 kg/m  , BMI Body mass index is 32.33 kg/m. GENERAL:  Well appearing HEENT:  Pupils equal round and reactive, fundi not visualized, oral mucosa unremarkable NECK:  No jugular venous distention, waveform within normal limits, carotid upstroke brisk and symmetric, no bruits LYMPHATICS:  No cervical adenopathy LUNGS:  Clear to auscultation bilaterally HEART:  RRR.  PMI not displaced or sustained,S1 and S2 within normal limits, no S3, no S4, no clicks, no rubs, no murmurs ABD:  Flat, positive bowel sounds normal in frequency in pitch, no bruits, no rebound, no guarding, no midline pulsatile mass, no hepatomegaly, no splenomegaly EXT:  2 plus pulses throughout, no edema, no cyanosis no clubbing SKIN:  No rashes no nodules NEURO:  Cranial nerves II through XII grossly intact, motor grossly intact throughout PSYCH:   Cognitively intact, oriented to person place and time   EKG:  EKG is not ordered today. Jason ekg ordered 03/30/16 demonstrates sinus rhythm rate 71 bpm.  Cannot rule out prior inferior infarct.  Left axis deviation.  Unchanged from prior.     Recent Labs: 10/03/2015: ALT 21; BUN 11; Creat 0.80; Potassium 4.6; Sodium 134    Lipid Panel    Component Value Date/Time   CHOL 167 10/03/2015 0833   TRIG 57 10/03/2015 0833   HDL 69 10/03/2015 0833   CHOLHDL 2.4 10/03/2015 0833   VLDL 11 10/03/2015 0833   LDLCALC 87 10/03/2015 0833   LDLDIRECT 189.7 12/10/2011 0901      Wt Readings from Last 3 Encounters:  05/10/16 212 lb 9.6 oz (96.4 kg)  03/30/16 212 lb (96.2 kg)  10/08/15 212 lb 6.4 oz (96.3 kg)      ASSESSMENT AND PLAN:  # Hypertension: Jason Compton is tolerating amlodipine well.  We discussed Jason pros and cons of aiming for a goal of <140 vs <150.  He prefers to aim for <150.  Therefore we will not make any changes at this time. Continue amlodipine 5 mg daily.  # Hyperlipidemia.  LDL 87 09/2015.  Continue atorvastatin.  # CV Disease Prevention: Continue aspirin 81 mg daily.   Current medicines are reviewed at length with Jason Compton today.  Jason Compton does not have concerns regarding medicines.  Jason following changes have been made:  None  Labs/ tests ordered today include:  No orders of Jason defined types were placed in this encounter.    Disposition:   FU with Acheron Sugg C. Oval Linsey, MD, [facility] in 6 months.   This note was written with Jason assistance of speech recognition software.  Please excuse any transcriptional errors.  Signed, Li Bobo C. Oval Linsey, MD, [facility]  05/10/2016 6:39 PM    [place]

## 2016-05-10 ENCOUNTER — Encounter: Payer: Self-pay | Admitting: Cardiovascular Disease

## 2016-05-10 ENCOUNTER — Ambulatory Visit (INDEPENDENT_AMBULATORY_CARE_PROVIDER_SITE_OTHER): Payer: Medicare Other | Admitting: Cardiovascular Disease

## 2016-05-10 VITALS — BP 145/85 | HR 71 | Ht 68.0 in | Wt 212.6 lb

## 2016-05-10 DIAGNOSIS — I10 Essential (primary) hypertension: Secondary | ICD-10-CM

## 2016-05-10 DIAGNOSIS — E785 Hyperlipidemia, unspecified: Secondary | ICD-10-CM

## 2016-05-10 NOTE — Patient Instructions (Signed)

## 2016-05-22 DIAGNOSIS — Z23 Encounter for immunization: Secondary | ICD-10-CM | POA: Diagnosis not present

## 2016-06-25 DIAGNOSIS — L308 Other specified dermatitis: Secondary | ICD-10-CM | POA: Diagnosis not present

## 2016-06-25 DIAGNOSIS — D1801 Hemangioma of skin and subcutaneous tissue: Secondary | ICD-10-CM | POA: Diagnosis not present

## 2016-06-25 DIAGNOSIS — Z85828 Personal history of other malignant neoplasm of skin: Secondary | ICD-10-CM | POA: Diagnosis not present

## 2016-06-25 DIAGNOSIS — L570 Actinic keratosis: Secondary | ICD-10-CM | POA: Diagnosis not present

## 2016-06-25 DIAGNOSIS — L821 Other seborrheic keratosis: Secondary | ICD-10-CM | POA: Diagnosis not present

## 2016-08-09 ENCOUNTER — Telehealth: Payer: Self-pay | Admitting: Cardiovascular Disease

## 2016-08-09 NOTE — Telephone Encounter (Signed)
New message  Pt would like recommendation for a BP monitor  Please call back and advise

## 2016-08-09 NOTE — Telephone Encounter (Signed)
LM for patient that OMRON arm BP cuff is recommended.

## 2016-09-14 ENCOUNTER — Other Ambulatory Visit: Payer: Self-pay

## 2016-09-14 MED ORDER — AMLODIPINE BESYLATE 5 MG PO TABS: 5.0000 mg | ORAL_TABLET | Freq: Every day | ORAL | 5 refills | Status: DC

## 2016-09-20 ENCOUNTER — Telehealth: Payer: Self-pay | Admitting: Cardiovascular Disease

## 2016-09-20 NOTE — Telephone Encounter (Signed)
Advised to contact PCP, per patient he does not currently have one Recommended he get PCP

## 2016-09-20 NOTE — Telephone Encounter (Signed)
New Message   Pt and wife traveling out of the country, and wanting to know Dr. Oval Linsey can write prescription for Tamaflu. Requesting call back from nurse.

## 2016-09-21 ENCOUNTER — Telehealth: Payer: Self-pay | Admitting: Emergency Medicine

## 2016-09-21 NOTE — Telephone Encounter (Signed)
Pt would like to know if you will take him as a patient. We are currently seeing his wife. He is a former [facility] pt. Informed wife that Dr Quay Burow is closed for pts and this would have to be approved.

## 2016-09-22 NOTE — Telephone Encounter (Signed)
Yes, I will accept him 

## 2016-09-22 NOTE — Telephone Encounter (Signed)
LVM for pt to call back and schedule appt to establish with Dr Quay Burow.

## 2016-10-21 ENCOUNTER — Ambulatory Visit (INDEPENDENT_AMBULATORY_CARE_PROVIDER_SITE_OTHER): Payer: Medicare Other | Admitting: Physician Assistant

## 2016-10-21 ENCOUNTER — Ambulatory Visit (INDEPENDENT_AMBULATORY_CARE_PROVIDER_SITE_OTHER): Payer: Medicare Other

## 2016-10-21 VITALS — BP 138/72 | HR 103 | Temp 99.7°F | Resp 18 | Ht 68.0 in | Wt 207.2 lb

## 2016-10-21 DIAGNOSIS — J181 Lobar pneumonia, unspecified organism: Secondary | ICD-10-CM | POA: Diagnosis not present

## 2016-10-21 DIAGNOSIS — J189 Pneumonia, unspecified organism: Secondary | ICD-10-CM

## 2016-10-21 DIAGNOSIS — R5081 Fever presenting with conditions classified elsewhere: Secondary | ICD-10-CM | POA: Diagnosis not present

## 2016-10-21 DIAGNOSIS — R05 Cough: Secondary | ICD-10-CM | POA: Diagnosis not present

## 2016-10-21 DIAGNOSIS — R509 Fever, unspecified: Secondary | ICD-10-CM | POA: Diagnosis not present

## 2016-10-21 LAB — POCT CBC
Granulocyte percent: 84.5 %G — AB (ref 37–80)
HCT, POC: 44.4 % (ref 43.5–53.7)
Hemoglobin: 15.4 g/dL (ref 14.1–18.1)
Lymph, poc: 1.5 (ref 0.6–3.4)
MCH, POC: 32.4 pg — AB (ref 27–31.2)
MCHC: 34.8 g/dL (ref 31.8–35.4)
MCV: 93.3 fL (ref 80–97)
MID (cbc): 0.3 (ref 0–0.9)
MPV: 7.3 fL (ref 0–99.8)
POC Granulocyte: 9.6 — AB (ref 2–6.9)
POC LYMPH PERCENT: 12.9 %L (ref 10–50)
POC MID %: 2.6 %M (ref 0–12)
Platelet Count, POC: 284 10*3/uL (ref 142–424)
RBC: 4.76 M/uL (ref 4.69–6.13)
RDW, POC: 12.9 %
WBC: 11.4 10*3/uL — AB (ref 4.6–10.2)

## 2016-10-21 LAB — POCT INFLUENZA A/B
Influenza A, POC: NEGATIVE
Influenza B, POC: NEGATIVE

## 2016-10-21 MED ORDER — AZITHROMYCIN 250 MG PO TABS: ORAL_TABLET | ORAL | 0 refills | Status: DC

## 2016-10-21 MED ORDER — CEFTRIAXONE SODIUM 1 G IJ SOLR
1.0000 g | Freq: Once | INTRAMUSCULAR | Status: AC
  Administered 2016-10-21: 1 g via INTRAMUSCULAR

## 2016-10-21 MED ORDER — IBUPROFEN 200 MG PO TABS
600.0000 mg | ORAL_TABLET | Freq: Once | ORAL | Status: AC
  Administered 2016-10-21: 600 mg via ORAL

## 2016-10-21 MED ORDER — BENZONATATE 100 MG PO CAPS: 100.0000 mg | ORAL_CAPSULE | Freq: Three times a day (TID) | ORAL | 0 refills | Status: DC | PRN

## 2016-10-21 MED ORDER — PROMETHAZINE-DM 6.25-15 MG/5ML PO SYRP: 5.0000 mL | ORAL_SOLUTION | Freq: Four times a day (QID) | ORAL | 0 refills | Status: DC | PRN

## 2016-10-21 NOTE — Progress Notes (Signed)
MRN: [id number] DOB: [birth year]  Subjective:   Jason Compton is a 81 y.o. male presenting for chief complaint of Cough (x 2weeks ) .  Reports 2 week  history of productive cough (no hemoptysis) with light colored phlegm. He has new onset fever that he just noticed today in office. Has associated fatigue and sinus congestion. Denies  ear fullness, sore throat, wheezing, shortness of breath, chest tightness,myalgia, lower leg swelling, chills, nausea, vomiting, abdominal pain and diarrhea. Has had sick contact with wife who has the same thing. Has history of seasonal allergies, no history of asthma. Patient has had  flu shot this season. No smoking.   Of note pt has been traveling in [place] for the past three weeks. He went to a physician in [place] one week and was prescribed zyrtec and mucinex for cough due to viral etiology. He left [place] 4 days ago.    Has hx of basal cell carcinoma. No hx of clots.    Fordyce has a current medication list which includes the following prescription(s): amlodipine, aspirin, atorvastatin, cholecalciferol, multivitamin, omeprazole, and vitamin c, and the following Facility-Administered Medications: ibuprofen. Also is allergic to altace [ramipril].  Connelly  has a past medical history of Exogenous obesity; GERD (gastroesophageal reflux disease); History of hiatal hernia; Hyperlipidemia; Hypertension; PVC's (premature ventricular contractions); and Skin cancer. Also  has a past surgical history that includes Cataract extraction and Cardiovascular stress test (04/18/2009).   Objective:   Vitals: BP 138/72   Pulse (!) 103   Temp (!) 102 F (38.9 C) (Oral)   Resp 18   Ht 5\' 8"  (1.727 m)   Wt 207 lb 3.2 oz (94 kg)   SpO2 96%   BMI 31.50 kg/m   Physical Exam  Constitutional: He is oriented to person, place, and time. He appears well-developed and well-nourished. No distress.  HENT:  Head: Normocephalic and atraumatic.  Eyes: Conjunctivae are normal.    Neck: Normal range of motion.  Cardiovascular: Normal rate, regular rhythm, normal heart sounds and intact distal pulses.   Pulmonary/Chest: Effort normal. He has rales in the right upper field.  Musculoskeletal:       Right lower leg: He exhibits no tenderness and no swelling.       Left lower leg: He exhibits no tenderness and no swelling.  Lymphadenopathy:       Head (right side): No submental, no submandibular, no tonsillar, no preauricular, no posterior auricular and no occipital adenopathy present.       Head (left side): No submental, no submandibular, no tonsillar, no preauricular, no posterior auricular and no occipital adenopathy present.    He has no cervical adenopathy.       Right: No supraclavicular adenopathy present.       Left: No supraclavicular adenopathy present.  Neurological: He is alert and oriented to person, place, and time.  Skin: Skin is warm and dry.  Psychiatric: He has a normal mood and affect.  Vitals reviewed.   Results for orders placed or performed in visit on 10/21/16 (from the past 24 hour(s))  POCT Influenza A/B     Status: None   Collection Time: 10/21/16 11:20 AM  Result Value Ref Range   Influenza A, POC Negative Negative   Influenza B, POC Negative Negative  POCT CBC     Status: Abnormal   Collection Time: 10/21/16 11:36 AM  Result Value Ref Range   WBC 11.4 (A) 4.6 - 10.2 K/uL   Lymph, poc 1.5  0.6 - 3.4   POC LYMPH PERCENT 12.9 10 - 50 %L   MID (cbc) 0.3 0 - 0.9   POC MID % 2.6 0 - 12 %M   POC Granulocyte 9.6 (A) 2 - 6.9   Granulocyte percent 84.5 (A) 37 - 80 %G   RBC 4.76 4.69 - 6.13 M/uL   Hemoglobin 15.4 14.1 - 18.1 g/dL   HCT, POC 44.4 43.5 - 53.7 %   MCV 93.3 80 - 97 fL   MCH, POC 32.4 (A) 27 - 31.2 pg   MCHC 34.8 31.8 - 35.4 g/dL   RDW, POC 12.9 %   Platelet Count, POC 284 142 - 424 K/uL   MPV 7.3 0 - 99.8 fL   Dg Chest 2 View  Result Date: 10/21/2016 CLINICAL DATA:  Two weeks of cough, fever, history of gastroesophageal  reflux, former smoker. EXAM: CHEST  2 VIEW COMPARISON:  Chest x-ray of March 11, 2014 FINDINGS: The lungs are borderline hyperinflated. The right perihilar lung markings are increased. The left lung is clear. The heart and pulmonary vascularity are normal. There is calcification in the wall of the aortic arch. The mediastinum is normal in width. There is no pleural effusion. The bony thorax exhibits no acute abnormality. IMPRESSION: Chronic bronchitic changes, stable. Inferior right upper lobe atelectasis or early pneumonia. Followup PA and lateral chest X-ray is recommended in 3-4 weeks following trial of antibiotic therapy to ensure resolution and exclude underlying malignancy. Thoracic aortic atherosclerosis. Electronically Signed   By: David  Martinique M.D.   On: 10/21/2016 11:45    Assessment and Plan :  This case was precepted with Dr. Mitchel Honour.   1. Fever, unspecified fever cause Improved with administration of 600mg  of ibuprofen in office. - POCT Influenza A/B - POCT CBC - DG Chest 2 View; Future - ibuprofen (ADVIL,MOTRIN) tablet 600 mg; Take 3 tablets (600 mg total) by mouth once.  2. Pneumonia of right upper lobe due to infectious organism ([place]) Pt instructed to follow up in 4 weeks for repeat CXR. Informed that if any symptoms worsen or he develops new SOB, chest discomfort, or heart palpitations seek care immediately. - cefTRIAXone (ROCEPHIN) injection 1 g; Inject 1 g into the muscle once. - azithromycin (ZITHROMAX) 250 MG tablet; Take 2 tabs PO x 1 dose, then 1 tab PO QD x 4 days  Dispense: 6 tablet; Refill: 0 - benzonatate (TESSALON) 100 MG capsule; Take 1-2 capsules (100-200 mg total) by mouth 3 (three) times daily as needed for cough.  Dispense: 40 capsule; Refill: 0 - promethazine-dextromethorphan (PROMETHAZINE-DM) 6.25-15 MG/5ML syrup; Take 5 mLs by mouth 4 (four) times daily as needed for cough.  Dispense: 118 mL; Refill: 0   [name], PA-C  [facility] 10/21/2016 11:48 AM

## 2016-10-21 NOTE — Patient Instructions (Addendum)
Take antibiotic as prescribed. You should be seeing improvement in 24-48 hours. Follow up with your PCP in 4 weeks for repeat chest xray. If any of your symptoms worsen or you develop new SOB, chest discomfort, or heart palpitations seek care immediately.   You can try the cough syrup, but be cautious as this could increase your risk for falls or make your urinary symptoms at night worse. Please discontinue if you notice this happening.   Community-Acquired Pneumonia, Adult Pneumonia is an infection of the lungs. One type of pneumonia can happen while a person is in a hospital. A different type can happen when a person is not in a hospital (community-acquired pneumonia). It is easy for this kind to spread from person to person. It can spread to you if you breathe near an infected person who coughs or sneezes. Some symptoms include:  A dry cough.  A wet (productive) cough.  Fever.  Sweating.  Chest pain. Follow these instructions at home:  Take over-the-counter and prescription medicines only as told by your doctor.  Only take cough medicine if you are losing sleep.  If you were prescribed an antibiotic medicine, take it as told by your doctor. Do not stop taking the antibiotic even if you start to feel better.  Sleep with your head and neck raised (elevated). You can do this by putting a few pillows under your head, or you can sleep in a recliner.  Do not use tobacco products. These include cigarettes, chewing tobacco, and e-cigarettes. If you need help quitting, ask your doctor.  Drink enough water to keep your pee (urine) clear or pale yellow. A shot (vaccine) can help prevent pneumonia. Shots are often suggested for:  People older than 79 years of age.  People older than 79 years of age:  Who are having cancer treatment.  Who have long-term (chronic) lung disease.  Who have problems with their body's defense system (immune system). You may also prevent pneumonia if you take  these actions:  Get the flu (influenza) shot every year.  Go to the dentist as often as told.  Wash your hands often. If soap and water are not available, use hand sanitizer. Contact a doctor if:  You have a fever.  You lose sleep because your cough medicine does not help. Get help right away if:  You are short of breath and it gets worse.  You have more chest pain.  Your sickness gets worse. This is very serious if:  You are an older adult.  Your body's defense system is weak.  You cough up blood. This information is not intended to replace advice given to you by your health care provider. Make sure you discuss any questions you have with your health care provider. Document Released: 01/26/2008 Document Revised: 01/15/2016 Document Reviewed: 12/04/2014 Elsevier Interactive Patient Education  2017 [employer].    IF you received an x-ray today, you will receive an invoice from [facility]. Please contact [facility] at [phone] with questions or concerns regarding your invoice.   IF you received labwork today, you will receive an invoice from [facility]. Please contact [facility] at [phone] with questions or concerns regarding your invoice.   Our billing staff will not be able to assist you with questions regarding bills from these companies.  You will be contacted with the lab results as soon as they are available. The fastest way to get your results is to activate your My Chart account. Instructions are located on the  last page of this paperwork. If you have not heard from [place] regarding the results in 2 weeks, please contact this office.

## 2016-10-26 ENCOUNTER — Other Ambulatory Visit: Payer: Self-pay

## 2016-10-26 MED ORDER — ATORVASTATIN CALCIUM 40 MG PO TABS: 40.0000 mg | ORAL_TABLET | Freq: Every day | ORAL | 11 refills | Status: DC

## 2016-10-29 ENCOUNTER — Encounter: Payer: Self-pay | Admitting: Internal Medicine

## 2016-10-29 ENCOUNTER — Ambulatory Visit (INDEPENDENT_AMBULATORY_CARE_PROVIDER_SITE_OTHER): Payer: Medicare Other | Admitting: Internal Medicine

## 2016-10-29 VITALS — BP 130/84 | HR 72 | Temp 98.0°F | Resp 16 | Ht 68.0 in | Wt 205.0 lb

## 2016-10-29 DIAGNOSIS — K900 Celiac disease: Secondary | ICD-10-CM

## 2016-10-29 DIAGNOSIS — J181 Lobar pneumonia, unspecified organism: Secondary | ICD-10-CM | POA: Diagnosis not present

## 2016-10-29 DIAGNOSIS — K219 Gastro-esophageal reflux disease without esophagitis: Secondary | ICD-10-CM

## 2016-10-29 DIAGNOSIS — I119 Hypertensive heart disease without heart failure: Secondary | ICD-10-CM | POA: Diagnosis not present

## 2016-10-29 DIAGNOSIS — R972 Elevated prostate specific antigen [PSA]: Secondary | ICD-10-CM

## 2016-10-29 DIAGNOSIS — J189 Pneumonia, unspecified organism: Secondary | ICD-10-CM

## 2016-10-29 DIAGNOSIS — E7800 Pure hypercholesterolemia, unspecified: Secondary | ICD-10-CM | POA: Diagnosis not present

## 2016-10-29 DIAGNOSIS — R739 Hyperglycemia, unspecified: Secondary | ICD-10-CM | POA: Diagnosis not present

## 2016-10-29 NOTE — Assessment & Plan Note (Signed)
Check a1c 

## 2016-10-29 NOTE — Assessment & Plan Note (Signed)
gerd controlled - discussed concerns with long term use of omeprazole Discussed possibly trying to wean off and change to pepcid or zantac

## 2016-10-29 NOTE — Assessment & Plan Note (Signed)
Check lipid panel  Continue daily statin Regular exercise and healthy diet encouraged  

## 2016-10-29 NOTE — Patient Instructions (Signed)
Have a chest xray done in 3 weeks. Have fasting blood work done.   Test(s) ordered today. Your results will be released to Christopher (or called to you) after review, usually within 72hours after test completion. If any changes need to be made, you will be notified at that same time.  Medications reviewed and updated.  No changes recommended at this time.    Please followup in one year for a physical

## 2016-10-29 NOTE — Progress Notes (Signed)
Subjective:    Patient ID: Jason Compton, male    DOB: 1936-05-21, 81 y.o.   MRN: [id number]  HPI He is here to establish with a new pcp.      Hypertension: He is taking his medication daily. He is compliant with a low sodium diet.  He denies chest pain, palpitations, edema, shortness of breath and regular headaches. He is not exercising regularly.  He does not monitor his blood pressure at home.    Hyperlipidemia: He is taking his medication daily. He is compliant with a low fat/cholesterol diet. He is exercising regularly. He denies myalgias.   Celiac disease: He avoids gluten but can tolerate small amounts.  His bowels are normal.    RUL pneumonia:  He was seen just over one week ago and was diagnosed with pneumonia.  He was started on antibiotics.  He still has a residual cough. still some resiudal fatigue, but they are improving and relatively minor.   He is coughing up minimal sputum.      Medications and allergies reviewed with patient and updated if appropriate.  Patient Active Problem List   Diagnosis Date Noted  . GERD (gastroesophageal reflux disease) 10/29/2016  . Cough 03/11/2014  . Celiac disease 07/30/2013  . Hard of hearing 07/30/2013  . Abdominal pain 07/12/2011  . Benign hypertensive heart disease without heart failure 03/08/2011  . Hypercholesterolemia 03/08/2011  . PSA elevation 03/08/2011    Current Outpatient Prescriptions on File Prior to Visit  Medication Sig Dispense Refill  . amLODipine (NORVASC) 5 MG tablet Take 1 tablet (5 mg total) by mouth daily. 30 tablet 5  . aspirin 81 MG tablet Take 81 mg by mouth daily.      Marland Kitchen atorvastatin (LIPITOR) 40 MG tablet Take 1 tablet (40 mg total) by mouth daily. FOLLOW UP NEEDED 30 tablet 11  . cholecalciferol (VITAMIN D) 1000 UNITS tablet Take 1,000 Units by mouth daily.     . Multiple Vitamin (MULTIVITAMIN) tablet Take 1 tablet by mouth daily.      Marland Kitchen omeprazole (PRILOSEC) 20 MG capsule Take 20 mg by mouth  daily.     . vitamin C (ASCORBIC ACID) 500 MG tablet Take 500 mg by mouth daily.       No current facility-administered medications on file prior to visit.     Past Medical History:  Diagnosis Date  . Exogenous obesity   . GERD (gastroesophageal reflux disease)   . History of hiatal hernia   . Hyperlipidemia   . Hypertension   . PVC's (premature ventricular contractions)   . Skin cancer     Past Surgical History:  Procedure Laterality Date  . CARDIOVASCULAR STRESS TEST  04/18/2009   NORMAL  . CATARACT EXTRACTION      Social History   Social History  . Marital status: Married    Spouse name: N/A  . Number of children: N/A  . Years of education: N/A   Social History Main Topics  . Smoking status: Former Smoker    Quit date: 08/23/2006  . Smokeless tobacco: Never Used  . Alcohol use 0.0 oz/week     Comment: wine  . Drug use: No  . Sexual activity: Not Asked   Other Topics Concern  . None   Social History Narrative  . None    Family History  Problem Relation Age of Onset  . Osteoporosis Mother   . Cancer Father     skin  . Hypertension Brother  Review of Systems  Constitutional: Negative for appetite change and fever.  Respiratory: Positive for cough (residual from pneumonia). Negative for shortness of breath and wheezing.   Cardiovascular: Negative for chest pain, palpitations and leg swelling.  Gastrointestinal: Negative for abdominal pain, blood in stool, constipation and diarrhea.  Neurological: Negative for dizziness, light-headedness and headaches.       Objective:   Vitals:   10/29/16 0959  BP: 130/84  Pulse: 72  Resp: 16  Temp: 98 F (36.7 C)   Filed Weights   10/29/16 0959  Weight: 205 lb (93 kg)   Body mass index is 31.17 kg/m.  Wt Readings from Last 3 Encounters:  10/29/16 205 lb (93 kg)  10/21/16 207 lb 3.2 oz (94 kg)  05/10/16 212 lb 9.6 oz (96.4 kg)     Physical Exam Constitutional: Appears well-developed and  well-nourished. No distress.  HENT:  Head: Normocephalic and atraumatic.  Neck: Neck supple. No tracheal deviation present. No thyromegaly present.  No cervical lymphadenopathy Cardiovascular: Normal rate, regular rhythm and normal heart sounds.   No murmur heard. No carotid bruit .  No edema Pulmonary/Chest: Effort normal and breath sounds normal. No respiratory distress. No has no wheezes. No rales.  Abdomen: soft, non tender, non distended Skin: Skin is warm and dry. Not diaphoretic.  Psychiatric: Normal mood and affect. Behavior is normal.         Assessment & Plan:   See Problem List for Assessment and Plan of chronic medical problems.

## 2016-10-29 NOTE — Progress Notes (Signed)
Pre visit review using our clinic review tool, if applicable. No additional management support is needed unless otherwise documented below in the visit note. 

## 2016-10-29 NOTE — Assessment & Plan Note (Signed)
BP well controlled Current regimen effective and well tolerated Continue current medications at current doses Check blood work

## 2016-10-29 NOTE — Assessment & Plan Note (Signed)
Symptoms improving with zpak Continue symptomatic treatment as needed Follow up CXR in about 3 weeks to confirm resolution

## 2016-10-29 NOTE — Assessment & Plan Note (Signed)
On a gluten free diet Symptoms controlled

## 2016-10-29 NOTE — Assessment & Plan Note (Signed)
Following with urology

## 2016-11-02 ENCOUNTER — Encounter: Payer: Self-pay | Admitting: Internal Medicine

## 2016-11-02 ENCOUNTER — Other Ambulatory Visit (INDEPENDENT_AMBULATORY_CARE_PROVIDER_SITE_OTHER): Payer: Medicare Other

## 2016-11-02 DIAGNOSIS — I119 Hypertensive heart disease without heart failure: Secondary | ICD-10-CM | POA: Diagnosis not present

## 2016-11-02 DIAGNOSIS — E7800 Pure hypercholesterolemia, unspecified: Secondary | ICD-10-CM | POA: Diagnosis not present

## 2016-11-02 DIAGNOSIS — R739 Hyperglycemia, unspecified: Secondary | ICD-10-CM | POA: Diagnosis not present

## 2016-11-02 DIAGNOSIS — R7303 Prediabetes: Secondary | ICD-10-CM | POA: Insufficient documentation

## 2016-11-02 LAB — COMPREHENSIVE METABOLIC PANEL
ALT: 34 U/L (ref 0–53)
AST: 19 U/L (ref 0–37)
Albumin: 3.8 g/dL (ref 3.5–5.2)
Alkaline Phosphatase: 68 U/L (ref 39–117)
BUN: 18 mg/dL (ref 6–23)
CO2: 28 mEq/L (ref 19–32)
Calcium: 9.5 mg/dL (ref 8.4–10.5)
Chloride: 102 mEq/L (ref 96–112)
Creatinine, Ser: 0.91 mg/dL (ref 0.40–1.50)
GFR: 85.52 mL/min (ref >60.00)
Glucose, Bld: 101 mg/dL — ABNORMAL HIGH (ref 70–99)
Potassium: 4.3 mEq/L (ref 3.5–5.1)
Sodium: 137 mEq/L (ref 135–145)
Total Bilirubin: 0.8 mg/dL (ref 0.2–1.2)
Total Protein: 6.8 g/dL (ref 6.0–8.3)

## 2016-11-02 LAB — CBC WITH DIFFERENTIAL/PLATELET
Basophils Absolute: 0 10*3/uL (ref 0.0–0.1)
Basophils Relative: 0.7 % (ref 0.0–3.0)
Eosinophils Absolute: 0.1 10*3/uL (ref 0.0–0.7)
Eosinophils Relative: 1.9 % (ref 0.0–5.0)
HCT: 46.7 % (ref 39.0–52.0)
Hemoglobin: 15.5 g/dL (ref 13.0–17.0)
Lymphocytes Relative: 38.2 % (ref 12.0–46.0)
Lymphs Abs: 2.2 10*3/uL (ref 0.7–4.0)
MCHC: 33.3 g/dL (ref 30.0–36.0)
MCV: 95.5 fl (ref 78.0–100.0)
Monocytes Absolute: 0.6 10*3/uL (ref 0.1–1.0)
Monocytes Relative: 9.8 % (ref 3.0–12.0)
Neutro Abs: 2.8 10*3/uL (ref 1.4–7.7)
Neutrophils Relative %: 49.4 % (ref 43.0–77.0)
Platelets: 275 10*3/uL (ref 150.0–400.0)
RBC: 4.89 Mil/uL (ref 4.22–5.81)
RDW: 13.2 % (ref 11.5–15.5)
WBC: 5.8 10*3/uL (ref 4.0–10.5)

## 2016-11-02 LAB — TSH: TSH: 1.46 u[IU]/mL (ref 0.35–4.50)

## 2016-11-02 LAB — LIPID PANEL
Cholesterol: 152 mg/dL (ref 0–200)
HDL: 44.3 mg/dL (ref >39.00)
LDL Cholesterol: 93 mg/dL (ref 0–99)
NonHDL: 107.96
Total CHOL/HDL Ratio: 3
Triglycerides: 75 mg/dL (ref 0.0–149.0)
VLDL: 15 mg/dL (ref 0.0–40.0)

## 2016-11-02 LAB — HEMOGLOBIN A1C: Hgb A1c MFr Bld: 6.1 % (ref 4.6–6.5)

## 2016-11-16 ENCOUNTER — Telehealth: Payer: Self-pay | Admitting: Internal Medicine

## 2016-11-16 ENCOUNTER — Ambulatory Visit (INDEPENDENT_AMBULATORY_CARE_PROVIDER_SITE_OTHER)
Admission: RE | Admit: 2016-11-16 | Discharge: 2016-11-16 | Disposition: A | Discharge status: Home / Self Care | Payer: Medicare Other | Source: Ambulatory Visit | Attending: Internal Medicine | Admitting: Internal Medicine

## 2016-11-16 DIAGNOSIS — R9389 Abnormal findings on diagnostic imaging of other specified body structures: Secondary | ICD-10-CM

## 2016-11-16 DIAGNOSIS — J189 Pneumonia, unspecified organism: Secondary | ICD-10-CM

## 2016-11-16 DIAGNOSIS — R05 Cough: Secondary | ICD-10-CM | POA: Diagnosis not present

## 2016-11-16 DIAGNOSIS — J181 Lobar pneumonia, unspecified organism: Secondary | ICD-10-CM | POA: Diagnosis not present

## 2016-11-16 DIAGNOSIS — R059 Cough, unspecified: Secondary | ICD-10-CM

## 2016-11-16 NOTE — Telephone Encounter (Signed)
There is still a persistent density in the right upper lung.  I would like him to have  Chest ct to evaluate further.   I have ordered this.

## 2016-11-17 NOTE — Telephone Encounter (Signed)
Spoke with pt to give results and inform CT has been ordered.

## 2016-11-19 ENCOUNTER — Encounter: Payer: Self-pay | Admitting: Cardiovascular Disease

## 2016-11-19 ENCOUNTER — Ambulatory Visit (INDEPENDENT_AMBULATORY_CARE_PROVIDER_SITE_OTHER): Payer: Medicare Other | Admitting: Cardiovascular Disease

## 2016-11-19 VITALS — BP 120/70 | HR 72 | Ht 68.0 in | Wt 208.0 lb

## 2016-11-19 DIAGNOSIS — E7800 Pure hypercholesterolemia, unspecified: Secondary | ICD-10-CM

## 2016-11-19 DIAGNOSIS — I10 Essential (primary) hypertension: Secondary | ICD-10-CM

## 2016-11-19 NOTE — Progress Notes (Signed)
Cardiology Office Note   Date:  11/19/2016   ID:  Jason Compton, DOB 02-01-1936, MRN [id number]  PCP:  Binnie Rail, MD  Cardiologist:   Skeet Latch, MD   Chief Complaint  Patient presents with  . Follow-up    NO chest pain, shortness of breath, edema, pain or cramping in legs, lightheaded or diziness      History of Present Illness: Jason Compton is a 81 y.o. male with hypertension, hyperlipidemia and Celiac disease who presents for follow up.  Mr. Mcomber was previously a patient of [name].  He has been doing well and denies any chest pain or shortness of breath.  He continues on his travels. Most recently they traveled to [place], [place], and [place] while in [place] he caught an upper respiratory infection. He was seen by local physicians and treated with an antibiotic. He is finally starting to feel better. He recently saw Dr. Billey Gosling, who recommended that he transitioned from omeprazole to an H2 blocker.  He has not yet started this transition.  He notes that his blood pressure has been well-controlled on amlodipine. He has not noted any chest pain, shortness of breath, lower extremity edema, orthopnea, or PND. He is not exercising regularly but plans to start walking again now that the weather is warmer.     Past Medical History:  Diagnosis Date  . Exogenous obesity   . GERD (gastroesophageal reflux disease)   . History of hiatal hernia   . Hyperlipidemia   . Hypertension   . PVC's (premature ventricular contractions)   . Skin cancer     Past Surgical History:  Procedure Laterality Date  . CARDIOVASCULAR STRESS TEST  04/18/2009   NORMAL  . CATARACT EXTRACTION       Current Outpatient Prescriptions  Medication Sig Dispense Refill  . amLODipine (NORVASC) 5 MG tablet Take 1 tablet (5 mg total) by mouth daily. 30 tablet 5  . aspirin 81 MG tablet Take 81 mg by mouth daily.      Marland Kitchen atorvastatin (LIPITOR) 40 MG tablet Take 1 tablet (40 mg total) by  mouth daily. FOLLOW UP NEEDED 30 tablet 11  . cholecalciferol (VITAMIN D) 1000 UNITS tablet Take 1,000 Units by mouth daily.     . Multiple Vitamin (MULTIVITAMIN) tablet Take 1 tablet by mouth daily.      Marland Kitchen omeprazole (PRILOSEC) 20 MG capsule Take 20 mg by mouth daily.     . vitamin C (ASCORBIC ACID) 500 MG tablet Take 500 mg by mouth daily.       No current facility-administered medications for this visit.     Allergies:   Altace [ramipril]    Social History:  The patient  reports that he quit smoking about 10 years ago. He has never used smokeless tobacco. He reports that he drinks alcohol. He reports that he does not use drugs.   Family History:  The patient's family history includes Cancer in his father; Hypertension in his brother; Osteoporosis in his mother.    ROS:  Please see the history of present illness.   Otherwise, review of systems are positive for none.   All other systems are reviewed and negative.    PHYSICAL EXAM: VS:  BP 120/70   Pulse 72   Ht 5\' 8"  (1.727 m)   Wt 94.3 kg (208 lb)   BMI 31.63 kg/m  , BMI Body mass index is 31.63 kg/m. GENERAL:  Well appearing HEENT:  Pupils equal  round and reactive, fundi not visualized, oral mucosa unremarkable NECK:  No jugular venous distention, waveform within normal limits, carotid upstroke brisk and symmetric, no bruits LYMPHATICS:  No cervical adenopathy LUNGS:  Clear to auscultation bilaterally HEART:  RRR.  PMI not displaced or sustained,S1 and S2 within normal limits, no S3, no S4, no clicks, no rubs, no murmurs ABD:  Flat, positive bowel sounds normal in frequency in pitch, no bruits, no rebound, no guarding, no midline pulsatile mass, no hepatomegaly, no splenomegaly EXT:  2 plus pulses throughout, no edema, no cyanosis no clubbing SKIN:  No rashes no nodules NEURO:  Cranial nerves II through XII grossly intact, motor grossly intact throughout PSYCH:  Cognitively intact, oriented to person place and time   EKG:   EKG is ordered today. The ekg ordered 03/30/16 demonstrates sinus rhythm rate 71 bpm.  Cannot rule out prior inferior infarct.  Left axis deviation.  Unchanged from prior.   11/19/16: Sinus rhythm. Rate 72 bpm. Low voltage limb leads. Prior inferior infarct.   Recent Labs: 11/02/2016: ALT 34; BUN 18; Creatinine, Ser 0.91; Hemoglobin 15.5; Platelets 275.0; Potassium 4.3; Sodium 137; TSH 1.46    Lipid Panel    Component Value Date/Time   CHOL 152 11/02/2016 1004   TRIG 75.0 11/02/2016 1004   HDL 44.30 11/02/2016 1004   CHOLHDL 3 11/02/2016 1004   VLDL 15.0 11/02/2016 1004   LDLCALC 93 11/02/2016 1004   LDLDIRECT 189.7 12/10/2011 0901      Wt Readings from Last 3 Encounters:  11/19/16 94.3 kg (208 lb)  10/29/16 93 kg (205 lb)  10/21/16 94 kg (207 lb 3.2 oz)      ASSESSMENT AND PLAN:  # Hypertension:  Blood pressure is well-controlled. ContinuShee amlodipine.    # Hyperlipidemia.  LDL 93 10/2016.  Continue atorvastatin.  # CV Disease Prevention: Continue aspirin 81 mg daily.   Current medicines are reviewed at length with the patient today.  The patient does not have concerns regarding medicines.  The following changes have been made:  None  Labs/ tests ordered today include:  No orders of the defined types were placed in this encounter.    Disposition:   FU with Mikaelyn Arthurs C. Oval Linsey, MD, [facility] in 6 months.   This note was written with the assistance of speech recognition software.  Please excuse any transcriptional errors.  Signed, Aaryn Sermon C. Oval Linsey, MD, [facility]  11/19/2016 9:07 AM    [place]

## 2016-11-19 NOTE — Patient Instructions (Signed)

## 2016-11-22 ENCOUNTER — Ambulatory Visit (INDEPENDENT_AMBULATORY_CARE_PROVIDER_SITE_OTHER)
Admission: RE | Admit: 2016-11-22 | Discharge: 2016-11-22 | Disposition: A | Discharge status: Home / Self Care | Payer: Medicare Other | Source: Ambulatory Visit | Attending: Internal Medicine | Admitting: Internal Medicine

## 2016-11-22 ENCOUNTER — Other Ambulatory Visit: Payer: Self-pay | Admitting: Internal Medicine

## 2016-11-22 DIAGNOSIS — R9389 Abnormal findings on diagnostic imaging of other specified body structures: Secondary | ICD-10-CM | POA: Insufficient documentation

## 2016-11-22 DIAGNOSIS — R938 Abnormal findings on diagnostic imaging of other specified body structures: Secondary | ICD-10-CM

## 2016-11-22 DIAGNOSIS — R05 Cough: Secondary | ICD-10-CM

## 2016-11-22 DIAGNOSIS — R059 Cough, unspecified: Secondary | ICD-10-CM

## 2016-11-22 DIAGNOSIS — I700 Atherosclerosis of aorta: Secondary | ICD-10-CM | POA: Diagnosis not present

## 2016-11-22 MED ORDER — IOPAMIDOL (ISOVUE-300) INJECTION 61%
80.0000 mL | Freq: Once | INTRAVENOUS | Status: AC | PRN
  Administered 2016-11-22: 80 mL via INTRAVENOUS

## 2017-02-21 ENCOUNTER — Telehealth: Payer: Self-pay | Admitting: Internal Medicine

## 2017-02-21 NOTE — Telephone Encounter (Signed)
Spoke with pt, informed him that he could contact cardiology to schedule follow-up CT.

## 2017-02-21 NOTE — Telephone Encounter (Signed)
Pt called in and has some questions and wanted to speak with a nurse    Best number is cell number

## 2017-02-28 ENCOUNTER — Encounter: Payer: Self-pay | Admitting: Internal Medicine

## 2017-02-28 ENCOUNTER — Ambulatory Visit (INDEPENDENT_AMBULATORY_CARE_PROVIDER_SITE_OTHER)
Admission: RE | Admit: 2017-02-28 | Discharge: 2017-02-28 | Disposition: A | Discharge status: Home / Self Care | Payer: Medicare Other | Source: Ambulatory Visit | Attending: Internal Medicine | Admitting: Internal Medicine

## 2017-02-28 DIAGNOSIS — R938 Abnormal findings on diagnostic imaging of other specified body structures: Secondary | ICD-10-CM | POA: Diagnosis not present

## 2017-02-28 DIAGNOSIS — R9389 Abnormal findings on diagnostic imaging of other specified body structures: Secondary | ICD-10-CM

## 2017-03-17 DIAGNOSIS — H04123 Dry eye syndrome of bilateral lacrimal glands: Secondary | ICD-10-CM | POA: Diagnosis not present

## 2017-03-17 DIAGNOSIS — H524 Presbyopia: Secondary | ICD-10-CM | POA: Diagnosis not present

## 2017-03-21 ENCOUNTER — Other Ambulatory Visit: Payer: Self-pay | Admitting: Cardiovascular Disease

## 2017-03-21 NOTE — Telephone Encounter (Signed)
Please review for refill, Thanks !  

## 2017-04-25 DIAGNOSIS — Z23 Encounter for immunization: Secondary | ICD-10-CM | POA: Diagnosis not present

## 2017-04-28 ENCOUNTER — Encounter: Payer: Self-pay | Admitting: Internal Medicine

## 2017-04-28 DIAGNOSIS — I700 Atherosclerosis of aorta: Secondary | ICD-10-CM | POA: Insufficient documentation

## 2017-04-28 DIAGNOSIS — I2510 Atherosclerotic heart disease of native coronary artery without angina pectoris: Secondary | ICD-10-CM | POA: Insufficient documentation

## 2017-05-05 ENCOUNTER — Encounter: Payer: Self-pay | Admitting: Cardiovascular Disease

## 2017-05-05 ENCOUNTER — Ambulatory Visit (INDEPENDENT_AMBULATORY_CARE_PROVIDER_SITE_OTHER): Payer: Medicare Other | Admitting: Cardiovascular Disease

## 2017-05-05 VITALS — BP 122/64 | HR 75 | Ht 68.0 in | Wt 212.0 lb

## 2017-05-05 DIAGNOSIS — R0683 Snoring: Secondary | ICD-10-CM

## 2017-05-05 DIAGNOSIS — E7800 Pure hypercholesterolemia, unspecified: Secondary | ICD-10-CM | POA: Diagnosis not present

## 2017-05-05 DIAGNOSIS — R0681 Apnea, not elsewhere classified: Secondary | ICD-10-CM

## 2017-05-05 DIAGNOSIS — I10 Essential (primary) hypertension: Secondary | ICD-10-CM | POA: Diagnosis not present

## 2017-05-05 NOTE — Patient Instructions (Addendum)
Medication Instructions:  Your physician recommends that you continue on your current medications as directed. Please refer to the Current Medication list given to you today.  Labwork: Fasting LP/CMET today   Testing/Procedures: Your physician has recommended that you have a sleep study. This test records several body functions during sleep, including: brain activity, eye movement, oxygen and carbon dioxide blood levels, heart rate and rhythm, breathing rate and rhythm, the flow of air through your mouth and nose, snoring, body muscle movements, and chest and belly movement.  Follow-Up: Your physician wants you to follow-up in: 6 month ov You will receive a reminder letter in the mail two months in advance. If you don't receive a letter, please call our office to schedule the follow-up appointment.  If you need a refill on your cardiac medications before your next appointment, please call your pharmacy.

## 2017-05-05 NOTE — Progress Notes (Signed)
Cardiology Office Note   Date:  05/05/2017   ID:  Jason Compton, DOB 20-Apr-1936, MRN [id number]  PCP:  Binnie Rail, MD  Cardiologist:   Skeet Latch, MD   Chief Complaint  Patient presents with  . Follow-up    84months;      History of Present Illness: Jason Compton is a 81 y.o. male with hypertension, hyperlipidemia and Celiac disease who presents for follow up.  Jason Compton was previously a patient of Jason Compton.  He has been doing well and denies any chest pain or shortness of breath.  He continues on his travels. Most recently they traveled out [place] to the [place].  He is scheduled to travel to [place] for the month of October.  They plan to do a lot of walking.  He hasn't been exercising much lately.  He denies chest pain, shortness of breath, lower extremity edema, orthopnea or PND.  Jason Compton wife notes that he snores loudly.  He frequently stops breathing while sleeping.  He denies daytime somnolence or fatigue in the AM.     Past Medical History:  Diagnosis Date  . Exogenous obesity   . GERD (gastroesophageal reflux disease)   . History of hiatal hernia   . Hyperlipidemia   . Hypertension   . PVC's (premature ventricular contractions)   . Skin cancer     Past Surgical History:  Procedure Laterality Date  . CARDIOVASCULAR STRESS TEST  04/18/2009   NORMAL  . CATARACT EXTRACTION       Current Outpatient Prescriptions  Medication Sig Dispense Refill  . amLODipine (NORVASC) 5 MG tablet TAKE 1 TABLET (5 MG TOTAL) BY MOUTH DAILY. 90 tablet 1  . aspirin 81 MG tablet Take 81 mg by mouth daily.      Marland Kitchen atorvastatin (LIPITOR) 40 MG tablet Take 1 tablet (40 mg total) by mouth daily. FOLLOW UP NEEDED 30 tablet 11  . cholecalciferol (VITAMIN D) 1000 UNITS tablet Take 1,000 Units by mouth daily.     . Multiple Vitamin (MULTIVITAMIN) tablet Take 1 tablet by mouth daily.      Marland Kitchen omeprazole (PRILOSEC) 20 MG capsule Take  20 mg by mouth daily.     . vitamin C (ASCORBIC ACID) 500 MG tablet Take 500 mg by mouth daily.       No current facility-administered medications for this visit.     Allergies:   Altace [ramipril]    Social History:  The patient  reports that he quit smoking about 10 years ago. He has never used smokeless tobacco. He reports that he drinks alcohol. He reports that he does not use drugs.   Family History:  The patient's family history includes Cancer in his father; Hypertension in his brother; Osteoporosis in his mother.    ROS:  Please see the history of present illness.   Otherwise, review of systems are positive for none.   All other systems are reviewed and negative.    PHYSICAL EXAM: VS:  BP 122/64   Pulse 75   Ht 5\' 8"  (1.727 m)   Wt 96.2 kg (212 lb)   BMI 32.23 kg/m  , BMI Body mass index is 32.23 kg/m. GENERAL:  Well appearing HEENT: Pupils equal round and reactive, fundi not visualized, oral mucosa unremarkable NECK:  No jugular venous distention, waveform within normal limits, carotid upstroke brisk and symmetric, no bruits LUNGS:  Clear to auscultation bilaterally HEART:  RRR.  PMI not  displaced or sustained,S1 and S2 within normal limits, no S3, no S4, no clicks, no rubs, no murmurs ABD:  Flat, positive bowel sounds normal in frequency in pitch, no bruits, no rebound, no guarding, no midline pulsatile mass, no hepatomegaly, no splenomegaly EXT:  2 plus pulses throughout, no edema, no cyanosis no clubbing SKIN:  No rashes no nodules NEURO:  Cranial nerves II through XII grossly intact, motor grossly intact throughout PSYCH:  Cognitively intact, oriented to person place and time   EKG:  EKG is ordered today. The ekg ordered 03/30/16 demonstrates sinus rhythm rate 71 bpm.  Cannot rule out prior inferior infarct.  Left axis deviation.  Unchanged from prior.   11/19/16: Sinus rhythm. Rate 72 bpm. Low voltage limb leads. Prior inferior infarct.   Recent Labs: 11/02/2016:  ALT 34; BUN 18; Creatinine, Ser 0.91; Hemoglobin 15.5; Platelets 275.0; Potassium 4.3; Sodium 137; TSH 1.46    Lipid Panel    Component Value Date/Time   CHOL 152 11/02/2016 1004   TRIG 75.0 11/02/2016 1004   HDL 44.30 11/02/2016 1004   CHOLHDL 3 11/02/2016 1004   VLDL 15.0 11/02/2016 1004   LDLCALC 93 11/02/2016 1004   LDLDIRECT 189.7 12/10/2011 0901      Wt Readings from Last 3 Encounters:  05/05/17 96.2 kg (212 lb)  11/19/16 94.3 kg (208 lb)  10/29/16 93 kg (205 lb)      ASSESSMENT AND PLAN:  # Hypertension:  Blood pressure  He was initially elevated but better on repeat. Continue amlodipine.  # Hyperlipidemia.  LDL 93 10/2016.  He was noted to have coronary calcifications on chest CT. We will repeat lipids and a CMP. If his LDL is greater than 70 he will work on diet and exercise for the next 3 months prior to rechecking.  Continue atorvastatin.  # Coronary calcification:  Continue aspirin 81 mg daily.  Increase exercise to at least 30-40 minutes most days of the week.   Current medicines are reviewed at length with the patient today.  The patient does not have concerns regarding medicines.  The following changes have been made:  None  Labs/ tests ordered today include:   Orders Placed This Encounter  Procedures  . Lipid panel  . Comprehensive metabolic panel  . Split night study     Disposition:   FU with [name], MD, [facility] in 6 months.   This note was written with the assistance of speech recognition software.  Please excuse any transcriptional errors.  Signed, Stevey Stapleton C. Oval Linsey, MD, [facility]  05/05/2017 8:41 AM    [place]

## 2017-05-06 LAB — COMPREHENSIVE METABOLIC PANEL
ALT: 18 IU/L (ref 0–44)
AST: 17 IU/L (ref 0–40)
Albumin/Globulin Ratio: 1.9 (ref 1.2–2.2)
Albumin: 4.1 g/dL (ref 3.5–4.8)
Alkaline Phosphatase: 69 IU/L (ref 39–117)
BUN/Creatinine Ratio: 18 (ref 10–24)
BUN: 15 mg/dL (ref 8–27)
Bilirubin Total: 0.9 mg/dL (ref 0.0–1.2)
CO2: 25 mmol/L (ref 20–29)
Calcium: 9.3 mg/dL (ref 8.6–10.2)
Chloride: 104 mmol/L (ref 96–106)
Creatinine, Ser: 0.83 mg/dL (ref 0.76–1.27)
GFR calc Af Amer: 97 mL/min/{1.73_m2} (ref >59)
GFR calc non Af Amer: 84 mL/min/{1.73_m2} (ref >59)
Globulin, Total: 2.2 g/dL (ref 1.5–4.5)
Glucose: 102 mg/dL — ABNORMAL HIGH (ref 65–99)
Potassium: 4.4 mmol/L (ref 3.5–5.2)
Sodium: 141 mmol/L (ref 134–144)
Total Protein: 6.3 g/dL (ref 6.0–8.5)

## 2017-05-06 LAB — LIPID PANEL
Chol/HDL Ratio: 2.4 ratio (ref 0.0–5.0)
Cholesterol, Total: 165 mg/dL (ref 100–199)
HDL: 68 mg/dL (ref >39)
LDL Calculated: 86 mg/dL (ref 0–99)
Triglycerides: 57 mg/dL (ref 0–149)
VLDL Cholesterol Cal: 11 mg/dL (ref 5–40)

## 2017-06-29 ENCOUNTER — Ambulatory Visit (HOSPITAL_BASED_OUTPATIENT_CLINIC_OR_DEPARTMENT_OTHER): Payer: Medicare Other | Attending: Cardiovascular Disease | Admitting: Cardiovascular Disease

## 2017-06-29 VITALS — Ht 68.0 in | Wt 207.0 lb

## 2017-06-29 DIAGNOSIS — G4730 Sleep apnea, unspecified: Secondary | ICD-10-CM | POA: Diagnosis present

## 2017-06-29 DIAGNOSIS — R0683 Snoring: Secondary | ICD-10-CM | POA: Diagnosis not present

## 2017-06-29 DIAGNOSIS — R0681 Apnea, not elsewhere classified: Secondary | ICD-10-CM

## 2017-06-29 DIAGNOSIS — G4761 Periodic limb movement disorder: Secondary | ICD-10-CM | POA: Diagnosis not present

## 2017-06-29 DIAGNOSIS — G4733 Obstructive sleep apnea (adult) (pediatric): Secondary | ICD-10-CM

## 2017-07-05 DIAGNOSIS — Z85828 Personal history of other malignant neoplasm of skin: Secondary | ICD-10-CM | POA: Diagnosis not present

## 2017-07-05 DIAGNOSIS — L821 Other seborrheic keratosis: Secondary | ICD-10-CM | POA: Diagnosis not present

## 2017-07-05 DIAGNOSIS — L812 Freckles: Secondary | ICD-10-CM | POA: Diagnosis not present

## 2017-07-05 DIAGNOSIS — L570 Actinic keratosis: Secondary | ICD-10-CM | POA: Diagnosis not present

## 2017-07-29 ENCOUNTER — Encounter (HOSPITAL_BASED_OUTPATIENT_CLINIC_OR_DEPARTMENT_OTHER): Payer: Self-pay | Admitting: Cardiovascular Disease

## 2017-07-29 NOTE — Procedures (Signed)
Patient Name: Jason Compton, [place] Date: 06/29/2017 Gender: Male D.O.B: [birth year] Age (years): 81 Referring Provider: Skeet Latch Height (inches): 68 Interpreting Physician: Shelva Majestic MD, ABSM Weight (lbs): 207 RPSGT: Carolin Coy BMI: 31 MRN: [id number] Neck Size: 17.00  CLINICAL INFORMATION Sleep Study Type: Split Night CPAP  Indication for sleep study: Snoring  Epworth Sleepiness Score: 8  SLEEP STUDY TECHNIQUE As per the AASM Manual for the Scoring of Sleep and Associated Events v2.3 (April 2016) with a hypopnea requiring 4% desaturations.  The channels recorded and monitored were frontal, central and occipital EEG, electrooculogram (EOG), submentalis EMG (chin), nasal and oral airflow, thoracic and abdominal wall motion, anterior tibialis EMG, snore microphone, electrocardiogram, and pulse oximetry. Continuous positive airway pressure (CPAP) was initiated when the patient met split night criteria and was titrated according to treat sleep-disordered breathing.  MEDICATIONS *    amLODipine (NORVASC) 5 MG tablet    TAKE 1 TABLET (5 MG TOTAL) BY MOUTH DAILY.   *    aspirin 81 MG tablet    Take 81 mg by mouth daily.             *    atorvastatin (LIPITOR) 40 MG tablet    Take 1 tablet (40 mg total) by mouth daily. FOLLOW UP NEEDED     *    cholecalciferol (VITAMIN D) 1000 UNITS tablet    Take 1,000 Units by mouth daily.            *    Multiple Vitamin (MULTIVITAMIN) tablet    Take 1 tablet by mouth daily.             *    omeprazole (PRILOSEC) 20 MG capsule    Take 20 mg by mouth daily.            *    vitamin C (ASCORBIC ACID) 500 MG tablet    Take 500 mg by mouth daily.             Medications self-administered by patient taken the night of the study : ASPIRIN, LIPITOR, RESTASIS, HYDRO GEL  RESPIRATORY PARAMETERS Diagnostic Total AHI (/hr): 60.2 RDI (/hr): 66.8 OA Index (/hr): 34.7 CA Index (/hr): 2.9 REM AHI (/hr): N/A NREM AHI (/hr): 60.2 Supine  AHI (/hr): 78.8 Non-supine AHI (/hr): 46.25 Min O2 Sat (%): 83.00 Mean O2 (%): 91.73 Time below 88% (min): 11.7   Titration Optimal Pressure (cm):  AHI at Optimal Pressure (/hr): N/A Min O2 at Optimal Pressure (%): 86.00 Supine % at Optimal (%): N/A Sleep % at Optimal (%): N/A   SLEEP ARCHITECTURE The recording time for the entire night was 365.9 minutes.  During a baseline period of 207.8 minutes, the patient slept for 145.4 minutes in REM and nonREM, yielding a sleep efficiency of 70.0%. Sleep onset after lights out was 6.4 minutes with a REM latency of N/A minutes. The patient spent 28.53% of the night in stage N1 sleep, 71.47% in stage N2 sleep, 0.00% in stage N3 and 0.00% in REM.  During the titration period of 152.1 minutes, the patient slept for 127.0 minutes in REM and nonREM, yielding a sleep efficiency of 83.5%. Sleep onset after CPAP initiation was 7.7 minutes with a REM latency of 46.0 minutes. The patient spent 17.33% of the night in stage N1 sleep, 53.53% in stage N2 sleep, 0.79% in stage N3 and 28.35% in REM.  CARDIAC DATA The 2 lead EKG demonstrated sinus rhythm. The mean heart rate  was 60.92 beats per minute. Other EKG findings include: PVCs.  LEG MOVEMENT DATA The total Periodic Limb Movements of Sleep (PLMS) were 122. The PLMS index was 26.86 .  IMPRESSIONS - Severe obstructive sleep apnea occurred during the diagnostic portion of the study (AHI = 60.2/hour).  There was absence of  REM sleep on the diagnostic study.  CPAP was initiated at 5 cm water pressure and was titrated up to 16 cm pressure.  AHI at 14 cm was 25.0.  AHI at 16 centimeters was 0, but the patient only slept for 14 minutes in non-REM sleep and REM sleep was not achieved at 16 cwp. - No significant central sleep apnea occurred during the diagnostic portion of the study (CAI = 2.9/hour). - Moderate oxygen desaturation to a nadir of 83%. - The patient snored with moderate snoring volume during the diagnostic  portion of the study. - EKG findings include PVCs. - Moderate periodic limb movements of sleep occurred during the study.  DIAGNOSIS - Obstructive Sleep Apnea (327.23 [G47.33 ICD-10] - PLMS  RECOMMENDATIONS - Recommend an initial trial of CPAP Auto with a range from 10 - 20 cm water pressure with heated humidification.  A future BiPAP titration trial may be necessary if optimal treatment cannot be achieved. - Effort should be made to optimize nasal and oral pharyngeal patency. -  If patient is symptomatic with restless legs while on CPAP therapy consider a trial of pharmacotherapy with a PLMS index of 26.86. - Avoid alcohol, sedatives and other CNS depressants that may worsen sleep apnea and disrupt normal sleep architecture. - Sleep hygiene should be reviewed to assess factors that may improve sleep quality. - Weight management and regular exercise should be initiated or continued. - Recommend a download be obtained in 30 days and sleep clinic follow-up evaluation.  [Electronically signed] 07/29/2017 02:43 PM  Shelva Majestic MD, [facility], ABSM Diplomate, American Board of Sleep Medicine  [place] PH: [phone]   FX: [phone] [place]

## 2017-08-03 ENCOUNTER — Telehealth: Payer: Self-pay | Admitting: *Deleted

## 2017-08-03 NOTE — Progress Notes (Signed)
lmtcb 12/12

## 2017-08-03 NOTE — Progress Notes (Signed)
12/12-patient aware and orders faxed to [facility], see telephone note

## 2017-08-03 NOTE — Telephone Encounter (Signed)
Follow up     Patient is returning your call for sleep study

## 2017-08-03 NOTE — Telephone Encounter (Signed)
Spoke to patient-patient aware of results and verbalized understanding.   Orders faxed to [facility] to call back if DME company does not contact him within 2 weeks.

## 2017-08-03 NOTE — Telephone Encounter (Signed)
-----   Message from Troy Sine, MD sent at 07/31/2017  6:50 PM EST ----- Caylyn Tedeschi, please notify the patient the result of the CPAP titration.  We will initially try a CPAP auto but ultimately the patient may require BiPAP titration study.

## 2017-08-03 NOTE — Telephone Encounter (Signed)
Left message to call back  

## 2017-08-26 ENCOUNTER — Telehealth: Payer: Self-pay | Admitting: *Deleted

## 2017-08-26 NOTE — Telephone Encounter (Signed)
Received notification from [facility]  Set up date: 08/25/17  Needs visit between: 02/04-04/03  Airsense 10 CPAP Auto 10-20 cm H2O Airfit P10 Nasal Pillow Medium  No appt currently scheduled.  Will have scheduler call to schedule follow up appt.

## 2017-08-30 ENCOUNTER — Telehealth: Payer: Self-pay | Admitting: *Deleted

## 2017-08-30 NOTE — Telephone Encounter (Signed)
CPAP supply order signed by Dr. Kelly and returned to [facility]. 

## 2017-08-31 ENCOUNTER — Telehealth: Payer: Self-pay | Admitting: Cardiovascular Disease

## 2017-08-31 NOTE — Telephone Encounter (Signed)
New message    Patient calling upset because he can not get sleep clinic appointment on the day of his choice. Patient wants Dr Oval Linsey to speak with Dr Claiborne Billings  to make adjustments. Patient states insurance will not pay if he is not seen between 2/4-->11/23/17. Patient advised that he should keep his appointment on 11/17/17, however he states he has other plans for that day. Patient requesting a call from Dr Oval Linsey.

## 2017-08-31 NOTE — Telephone Encounter (Signed)
If unable to reach on home number call cell

## 2017-08-31 NOTE — Telephone Encounter (Signed)
Closed encounter °

## 2017-08-31 NOTE — Telephone Encounter (Signed)
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 They only meet quarterly and he has to be there. This is the only day in March he has anything he has to do. Will forward to [place] with Dr Claiborne Billings to see what options are available.

## 2017-09-07 NOTE — Telephone Encounter (Signed)
Patient has been rescheduled for February and is aware of date, time, and location

## 2017-09-16 ENCOUNTER — Other Ambulatory Visit: Payer: Self-pay | Admitting: Cardiovascular Disease

## 2017-09-16 NOTE — Telephone Encounter (Signed)
REFILL 

## 2017-09-16 NOTE — Telephone Encounter (Signed)
Please review for refill, Thanks !  

## 2017-09-22 ENCOUNTER — Telehealth: Payer: Self-pay

## 2017-09-22 DIAGNOSIS — M542 Cervicalgia: Secondary | ICD-10-CM | POA: Diagnosis not present

## 2017-09-22 DIAGNOSIS — M47812 Spondylosis without myelopathy or radiculopathy, cervical region: Secondary | ICD-10-CM | POA: Diagnosis not present

## 2017-09-22 NOTE — Telephone Encounter (Signed)
Copied from [place]. Topic: Quick Communication - See Telephone Encounter >> Sep 22, 2017  9:16 AM Percell Belt A wrote: CRM for notification. See Telephone encounter for:  Pt would like to be put on waiting list for the shingles vaccine  09/22/17.  Left message advising patient that if he is medicare primary (no longer working), he will need to get injection at local pharmacy of his choice---medicare will only cover cost of injection if given at pharmacy, any other questions, can call our office back

## 2017-10-13 DIAGNOSIS — M47812 Spondylosis without myelopathy or radiculopathy, cervical region: Secondary | ICD-10-CM | POA: Insufficient documentation

## 2017-10-18 ENCOUNTER — Encounter: Payer: Self-pay | Admitting: Cardiovascular Disease

## 2017-10-18 ENCOUNTER — Ambulatory Visit (INDEPENDENT_AMBULATORY_CARE_PROVIDER_SITE_OTHER): Payer: Medicare Other | Admitting: Cardiovascular Disease

## 2017-10-18 VITALS — BP 122/68 | HR 70 | Ht 68.0 in | Wt 222.4 lb

## 2017-10-18 DIAGNOSIS — G4733 Obstructive sleep apnea (adult) (pediatric): Secondary | ICD-10-CM

## 2017-10-18 DIAGNOSIS — I10 Essential (primary) hypertension: Secondary | ICD-10-CM | POA: Diagnosis not present

## 2017-10-18 DIAGNOSIS — E7800 Pure hypercholesterolemia, unspecified: Secondary | ICD-10-CM | POA: Diagnosis not present

## 2017-10-18 NOTE — Progress Notes (Signed)
Cardiology Office Note    Date:  10/20/2017   ID:  Jason Compton, DOB September 25, 1935, MRN [id number]  PCP:  Jason Rail, MD  Cardiologist:  Jason Majestic, MD    New Evaluation for sleep  History of Present Illness:  Jason Compton is a 82 y.o. male who presents to the office today for his initial evaluation with me following initiation of CPAP therapy.  Jason Compton is a former patient of Jason Compton, and now sees Jason Compton for cardiology care.  He has a history of hypertension, hyperlipidemia, and silly at disease.  When seen by Jason Compton.  His wife noted that he had very loud snoring and also was aware of witnessed apnea.  He was referred for a sleep study which was done on 06/29/2017.  This revealed severe obstructive sleep apnea with an HI of 60.2.  There was absence of REM sleep on the diagnostic portion of the study.  Was moderate oxygen desaturation to a nadir of 83%.  There was moderate snoring and moderate periodic limb movements of sleep.  He was started on CPAP therapy with a set up date of 08/26/2007 and has a ResMed air.  Sensed 10 AutoSet which initially was set at a minimum pressure of 10 and maximum pressure of 20.  He has been using nasal pillow mask.  A download from January 28 through 10/18/2017 reveals excellent compliance with 93% of days.  Use.  There was only 2 days when he was out of town that he did not use his machine.  He was averaging 7 hours of sleep per night.  AHI was 2.7.  95th percentile pressure was 13.6 with a maximum of 14.8.  Since initiating CPAP, his nocturia has improved from 5 times per night to 0-1 time per night.  His sleep is more restorative.  He is dreaming.  He denies residual daytime sleepiness.  An Epworth Sleepiness Scale score was calculated in the office today and this endorsed at 8.  He presents for evaluation   Epworth Sleepiness Scale: Situation   Chance of Dozing/Sleeping (0 = never , 1 = slight chance , 2 = moderate chance  , 3 = high chance )   sitting and reading 1   watching TV 2   sitting inactive in a public place 1   being a passenger in a motor vehicle for an hour or more 0   lying down in the afternoon 3   sitting and talking to someone 0   sitting quietly after lunch (no alcohol) 1   while stopped for a few minutes in traffic as the driver 0   Total Score  8    Past Medical History:  Diagnosis Date  . Exogenous obesity   . GERD (gastroesophageal reflux disease)   . History of hiatal hernia   . Hyperlipidemia   . Hypertension   . PVC's (premature ventricular contractions)   . Skin cancer     Past Surgical History:  Procedure Laterality Date  . CARDIOVASCULAR STRESS TEST  04/18/2009   NORMAL  . CATARACT EXTRACTION      Current Medications: Outpatient Medications Prior to Visit  Medication Sig Dispense Refill  . amLODipine (NORVASC) 5 MG tablet TAKE 1 TABLET BY MOUTH EVERY DAY 90 tablet 1  . aspirin 81 MG tablet Take 81 mg by mouth daily.      Marland Kitchen atorvastatin (LIPITOR) 40 MG tablet Take 1 tablet (40 mg total) by mouth daily. FOLLOW UP  NEEDED 30 tablet 11  . cholecalciferol (VITAMIN D) 1000 UNITS tablet Take 1,000 Units by mouth daily.     . Multiple Vitamin (MULTIVITAMIN) tablet Take 1 tablet by mouth daily.      Marland Kitchen omeprazole (PRILOSEC) 20 MG capsule Take 20 mg by mouth daily.     . predniSONE (DELTASONE) 10 MG tablet TAKE FOUR TABS X 2 DAYS, THEN THREE TABS X 2 DAYS, THEN TWO X 2 DAYS, AND THE ONE TAB X 2 DAYS  0  . vitamin C (ASCORBIC ACID) 500 MG tablet Take 500 mg by mouth daily.       No facility-administered medications prior to visit.      Allergies:   Altace [ramipril]   Social History   Socioeconomic History  . Marital status: Married    Spouse name: None  . Number of children: None  . Years of education: None  . Highest education level: None  Social Needs  . Financial resource strain: None  . Food insecurity - worry: None  . Food insecurity - inability: None  .  Transportation needs - medical: None  . Transportation needs - non-medical: None  Occupational History  . None  Tobacco Use  . Smoking status: Former Smoker    Last attempt to quit: 08/23/2006    Years since quitting: 11.1  . Smokeless tobacco: Never Used  Substance and Sexual Activity  . Alcohol use: Yes    Alcohol/week: 0.0 oz    Comment: wine  . Drug use: No  . Sexual activity: None  Other Topics Concern  . None  Social History Narrative  . None     Family History:  The patient's family history includes Cancer in his father; Hypertension in his brother; Osteoporosis in his mother.   ROS General: Negative; No fevers, chills, or night sweats;  HEENT: Negative; No changes in vision or hearing, sinus congestion, difficulty swallowing Pulmonary: Negative; No cough, wheezing, shortness of breath, hemoptysis Cardiovascular: Positive for hypertension and hyperlipidemia No chest pain, presyncope, syncope, palpitations GI: Negative; No nausea, vomiting, diarrhea, or abdominal pain GU: Negative; No dysuria, hematuria, or difficulty voiding Musculoskeletal: Negative; no myalgias, joint pain, or weakness Hematologic/Oncology: Negative; no easy bruising, bleeding Endocrine: Negative; no heat/cold intolerance; no diabetes Neuro: Negative; no changes in balance, headaches Skin: Negative; No rashes or skin lesions Psychiatric: Negative; No behavioral problems, depression Sleep: Positive for OSA, severe.  Since initiating CPAP therapy.  He is unaware of breakthrough  snoring, daytime sleepiness, hypersomnolence, bruxism, restless legs, hypnogognic hallucinations, no cataplexy Other comprehensive 14 point system review is negative.  PHYSICAL EXAM:   VS:  BP 122/68   Pulse 70   Ht '5\' 8"'  (1.727 m)   Wt 222 lb 6.4 oz (100.9 kg)   BMI 33.82 kg/m     Wt Readings from Last 3 Encounters:  10/18/17 222 lb 6.4 oz (100.9 kg)  06/29/17 207 lb (93.9 kg)  05/05/17 212 lb (96.2 kg)    General:  Alert, oriented, no distress.  Skin: normal turgor, no rashes, warm and dry HEENT: Normocephalic, atraumatic. Pupils equal round and reactive to light; sclera anicteric; extraocular muscles intact; Fundi status post cataract surgery with lens implants Nose without nasal septal hypertrophy Mouth/Parynx benign; Mallinpatti scale 3 Neck: No JVD, no carotid bruits; normal carotid upstroke Lungs: clear to ausculatation and percussion; no wheezing or rales Chest wall: without tenderness to palpitation Heart: PMI not displaced, RRR, s1 s2 normal, 1/6 systolic murmur, no diastolic murmur, no rubs, gallops, thrills,  or heaves Abdomen: soft, nontender; no hepatosplenomehaly, BS+; abdominal aorta nontender and not dilated by palpation. Back: no CVA tenderness Pulses 2+ Musculoskeletal: full range of motion, normal strength, no joint deformities Extremities: no clubbing cyanosis or edema, Homan's sign negative  Neurologic: grossly nonfocal; Cranial nerves grossly wnl Psychologic: Normal mood and affect   Studies/Labs Reviewed:   EKG:  EKG is  ordered today.  ECG (independently read by me): Normal sinus rhythm at 70 bpm.  Q waves in 3 and aVF.  Normal intervals.  Recent Labs: BMP Latest Ref Rng & Units 05/05/2017 11/02/2016 10/03/2015  Glucose 65 - 99 mg/dL 102(H) 101(H) 98  BUN 8 - 27 mg/dL '15 18 11  ' Creatinine 0.76 - 1.27 mg/dL 0.83 0.91 0.80  BUN/Creat Ratio 10 - 24 18 - -  Sodium 134 - 144 mmol/L 141 137 134(L)  Potassium 3.5 - 5.2 mmol/L 4.4 4.3 4.6  Chloride 96 - 106 mmol/L 104 102 100  CO2 20 - 29 mmol/L '25 28 26  ' Calcium 8.6 - 10.2 mg/dL 9.3 9.5 8.7     Hepatic Function Latest Ref Rng & Units 05/05/2017 11/02/2016 10/03/2015  Total Protein 6.0 - 8.5 g/dL 6.3 6.8 6.0(L)  Albumin 3.5 - 4.8 g/dL 4.1 3.8 3.6  AST 0 - 40 IU/L '17 19 19  ' ALT 0 - 44 IU/L 18 34 21  Alk Phosphatase 39 - 117 IU/L 69 68 63  Total Bilirubin 0.0 - 1.2 mg/dL 0.9 0.8 0.8  Bilirubin, Direct <=0.2 mg/dL - - 0.2     CBC Latest Ref Rng & Units 11/02/2016 10/21/2016 07/30/2013  WBC 4.0 - 10.5 K/uL 5.8 11.4(A) 5.7  Hemoglobin 13.0 - 17.0 g/dL 15.5 15.4 15.6  Hematocrit 39.0 - 52.0 % 46.7 44.4 46.2  Platelets 150.0 - 400.0 K/uL 275.0 - 190.0   Lab Results  Component Value Date   MCV 95.5 11/02/2016   MCV 93.3 10/21/2016   MCV 97.5 07/30/2013   Lab Results  Component Value Date   TSH 1.46 11/02/2016   Lab Results  Component Value Date   HGBA1C 6.1 11/02/2016     BNP No results found for: BNP  ProBNP No results found for: PROBNP   Lipid Panel     Component Value Date/Time   CHOL 165 05/05/2017 0832   TRIG 57 05/05/2017 0832   HDL 68 05/05/2017 0832   CHOLHDL 2.4 05/05/2017 0832   CHOLHDL 3 11/02/2016 1004   VLDL 15.0 11/02/2016 1004   LDLCALC 86 05/05/2017 0832   LDLDIRECT 189.7 12/10/2011 0901    RADIOLOGY: No results found.   Additional studies/ records that were reviewed today include:  I reviewed the records of Jason Compton.  I reviewed his sleep study and generated his download on CPAP therapy.    ASSESSMENT:    1. OSA (obstructive sleep apnea)   2. Essential hypertension   3. Pure hypercholesterolemia     PLAN:  Mr. Leeland Lovelady is a very pleasant 82 year old gentleman who is retired and since retirement.  HEENT his wife have traveled extensively.  Has a history of hypertension and hyperlipidemia.  In addition to celiac disease.  He was recently diagnosed with severe sleep apnea and had symptoms of witnessed apnea, significant nocturia 4-5 times per night, as well as daytime sleepiness.  He was found to have severe sleep apnea and on the diagnostic portion of the study was unable to achieve REM sleep.  Since initiating CPAP therapy on 08/25/2017.  He is compliant.  He notes  a marked improvement in his sleep quality.  There is been a dramatic reduction in nocturia such that now most nights he does not have to get up and urinate at all.  He is unaware of breakthrough  snoring.  I reviewed with him the normal sleep architecture and the effects of untreated sleep apnea on sleep architecture, as well as adverse consequences related to cardiovascular health.  His AHI is excellent at 2.7.  He is using nasal masks.  At times he has had very mild leak above 24 which may be related to his 13 of 14 water pressure through his nasal pillows.  I answered all his questions.  We discussed maintenance issues.  I discussed the importance of taking his CPAP with him during travel.  I will see him in one year for follow-up sleep evaluation.  Time spent: 30 minutes Medication Adjustments/Labs and Tests Ordered: Current medicines are reviewed at length with the patient today.  Concerns regarding medicines are outlined above.  Medication changes, Labs and Tests ordered today are listed in the Patient Instructions below. Patient Instructions  Follow-Up: Your physician wants you to follow-up in: 12 months with Dr. Claiborne Billings (sleep clinic). You will receive a reminder letter in the mail two months in advance. If you don't receive a letter, please call our office to schedule the follow-up appointment.         Signed, Jason Majestic, MD  10/20/2017 8:40 PM    [facility] [address] [place], [place], [place]  [address] Phone: [phone]

## 2017-10-18 NOTE — Patient Instructions (Signed)
  Follow-Up: Your physician wants you to follow-up in: 12 months with Dr. Kelly (sleep clinic). You will receive a reminder letter in the mail two months in advance. If you don't receive a letter, please call our office to schedule the follow-up appointment.    

## 2017-10-20 ENCOUNTER — Encounter: Payer: Self-pay | Admitting: Cardiovascular Disease

## 2017-11-03 ENCOUNTER — Ambulatory Visit: Payer: Medicare Other | Admitting: Internal Medicine

## 2017-11-09 ENCOUNTER — Other Ambulatory Visit: Payer: Self-pay | Admitting: *Deleted

## 2017-11-09 ENCOUNTER — Encounter: Payer: Self-pay | Admitting: Internal Medicine

## 2017-11-09 ENCOUNTER — Ambulatory Visit (INDEPENDENT_AMBULATORY_CARE_PROVIDER_SITE_OTHER): Payer: Medicare Other | Admitting: Internal Medicine

## 2017-11-09 ENCOUNTER — Other Ambulatory Visit (INDEPENDENT_AMBULATORY_CARE_PROVIDER_SITE_OTHER): Payer: Medicare Other

## 2017-11-09 VITALS — BP 126/74 | HR 70 | Temp 98.6°F | Resp 16 | Ht 68.0 in | Wt 222.0 lb

## 2017-11-09 DIAGNOSIS — I10 Essential (primary) hypertension: Secondary | ICD-10-CM

## 2017-11-09 DIAGNOSIS — E7800 Pure hypercholesterolemia, unspecified: Secondary | ICD-10-CM | POA: Diagnosis not present

## 2017-11-09 DIAGNOSIS — R7303 Prediabetes: Secondary | ICD-10-CM

## 2017-11-09 DIAGNOSIS — K219 Gastro-esophageal reflux disease without esophagitis: Secondary | ICD-10-CM | POA: Diagnosis not present

## 2017-11-09 DIAGNOSIS — I2510 Atherosclerotic heart disease of native coronary artery without angina pectoris: Secondary | ICD-10-CM

## 2017-11-09 DIAGNOSIS — Z23 Encounter for immunization: Secondary | ICD-10-CM

## 2017-11-09 DIAGNOSIS — R972 Elevated prostate specific antigen [PSA]: Secondary | ICD-10-CM | POA: Diagnosis not present

## 2017-11-09 LAB — CBC WITH DIFFERENTIAL/PLATELET
Basophils Absolute: 0 10*3/uL (ref 0.0–0.1)
Basophils Relative: 0.8 % (ref 0.0–3.0)
Eosinophils Absolute: 0.1 10*3/uL (ref 0.0–0.7)
Eosinophils Relative: 2.2 % (ref 0.0–5.0)
HCT: 46.3 % (ref 39.0–52.0)
Hemoglobin: 15.6 g/dL (ref 13.0–17.0)
Lymphocytes Relative: 36 % (ref 12.0–46.0)
Lymphs Abs: 1.6 10*3/uL (ref 0.7–4.0)
MCHC: 33.7 g/dL (ref 30.0–36.0)
MCV: 98.5 fl (ref 78.0–100.0)
Monocytes Absolute: 0.6 10*3/uL (ref 0.1–1.0)
Monocytes Relative: 12.5 % — ABNORMAL HIGH (ref 3.0–12.0)
Neutro Abs: 2.2 10*3/uL (ref 1.4–7.7)
Neutrophils Relative %: 48.5 % (ref 43.0–77.0)
Platelets: 211 10*3/uL (ref 150.0–400.0)
RBC: 4.7 Mil/uL (ref 4.22–5.81)
RDW: 13.6 % (ref 11.5–15.5)
WBC: 4.5 10*3/uL (ref 4.0–10.5)

## 2017-11-09 LAB — HEMOGLOBIN A1C: Hgb A1c MFr Bld: 5.9 % (ref 4.6–6.5)

## 2017-11-09 LAB — TSH: TSH: 1.12 u[IU]/mL (ref 0.35–4.50)

## 2017-11-09 MED ORDER — ATORVASTATIN CALCIUM 40 MG PO TABS: 40.0000 mg | ORAL_TABLET | Freq: Every day | ORAL | 0 refills | Status: DC

## 2017-11-09 NOTE — Progress Notes (Signed)
Subjective:    Patient ID: Jason Compton, male    DOB: 1935/08/26, 82 y.o.   MRN: [id number]  HPI The patient is here for follow up.      CAD, Hypertension: He is taking his medication daily. He is compliant with a low sodium diet.  He denies chest pain, palpitations, edema, shortness of breath and regular headaches. He is exercising irregularly - does some walkig.  He does not monitor his blood pressure at home.    Hyperlipidemia: He is taking his medication daily. He is compliant with a low fat/cholesterol diet. He is exercising irregularly. He denies myalgias.   Prediabetes:  He is compliant with a low sugar/carbohydrate diet.  He is exercising irregularly.  GERD:  He is taking his medication daily as prescribed.  He denies any GERD symptoms and feels his GERD is well controlled.   OSA:  He is now using his cpap nightly.  He sleeps longer at night.  He feels a little less sleepy during the afternoon.    Increased abdominal gurgling:  He denies changes in diet.  He denies abdominal pain, constipation and diarreha.  He does not have any blood in the stool.  He does not think this is increased, but his wife was concerned.  He is not concerned, but was advised to mention it.  Medications and allergies reviewed with patient and updated if appropriate.  Patient Active Problem List   Diagnosis Date Noted  . Cervical spondylosis without myelopathy 10/13/2017  . Aortic atherosclerosis ([place]) 04/28/2017  . Coronary artery disease 04/28/2017  . Prediabetes 11/02/2016  . GERD (gastroesophageal reflux disease) 10/29/2016  . Community acquired pneumonia of right upper lobe of lung ([place]) 10/29/2016  . Celiac disease 07/30/2013  . Hard of hearing 07/30/2013  . Essential hypertension 03/08/2011  . Hypercholesterolemia 03/08/2011  . PSA elevation 03/08/2011    Current Outpatient Medications on File Prior to Visit  Medication Sig Dispense Refill  . amLODipine (NORVASC) 5 MG tablet TAKE 1  TABLET BY MOUTH EVERY DAY 90 tablet 1  . aspirin 81 MG tablet Take 81 mg by mouth daily.      Marland Kitchen atorvastatin (LIPITOR) 40 MG tablet Take 1 tablet (40 mg total) by mouth daily. FOLLOW UP NEEDED 30 tablet 11  . cholecalciferol (VITAMIN D) 1000 UNITS tablet Take 1,000 Units by mouth daily.     . Multiple Vitamin (MULTIVITAMIN) tablet Take 1 tablet by mouth daily.      Marland Kitchen omeprazole (PRILOSEC) 20 MG capsule Take 20 mg by mouth daily.     . vitamin C (ASCORBIC ACID) 500 MG tablet Take 500 mg by mouth daily.       No current facility-administered medications on file prior to visit.     Past Medical History:  Diagnosis Date  . Exogenous obesity   . GERD (gastroesophageal reflux disease)   . History of hiatal hernia   . Hyperlipidemia   . Hypertension   . PVC's (premature ventricular contractions)   . Skin cancer     Past Surgical History:  Procedure Laterality Date  . CARDIOVASCULAR STRESS TEST  04/18/2009   NORMAL  . CATARACT EXTRACTION      Social History   Socioeconomic History  . Marital status: Married    Spouse name: None  . Number of children: None  . Years of education: None  . Highest education level: None  Social Needs  . Financial resource strain: None  . Food insecurity - worry: None  .  Food insecurity - inability: None  . Transportation needs - medical: None  . Transportation needs - non-medical: None  Occupational History  . None  Tobacco Use  . Smoking status: Former Smoker    Last attempt to quit: 08/23/2006    Years since quitting: 11.2  . Smokeless tobacco: Never Used  Substance and Sexual Activity  . Alcohol use: Yes    Alcohol/week: 0.0 oz    Comment: wine  . Drug use: No  . Sexual activity: None  Other Topics Concern  . None  Social History Narrative  . None    Family History  Problem Relation Age of Onset  . Osteoporosis Mother   . Cancer Father        skin  . Hypertension Brother     Review of Systems  Constitutional: Negative for  chills and fever.  Respiratory: Negative for cough, shortness of breath and wheezing.   Cardiovascular: Negative for chest pain and palpitations.  Gastrointestinal: Negative for abdominal pain, blood in stool, constipation, diarrhea and nausea.  Neurological: Negative for light-headedness and headaches.  Psychiatric/Behavioral: Negative for dysphoric mood and sleep disturbance. The patient is not nervous/anxious.        Objective:   Vitals:   11/09/17 1501  BP: 126/74  Pulse: 70  Resp: 16  Temp: 98.6 F (37 C)  SpO2: 94%   BP Readings from Last 3 Encounters:  11/09/17 126/74  10/18/17 122/68  05/05/17 122/64   Wt Readings from Last 3 Encounters:  11/09/17 222 lb (100.7 kg)  10/18/17 222 lb 6.4 oz (100.9 kg)  06/29/17 207 lb (93.9 kg)   Body mass index is 33.75 kg/m.   Physical Exam    Constitutional: Appears well-developed and well-nourished. No distress.  HENT:  Head: Normocephalic and atraumatic.  Neck: Neck supple. No tracheal deviation present. No thyromegaly present.  No cervical lymphadenopathy Cardiovascular: Normal rate, regular rhythm and normal heart sounds.   No murmur heard. No carotid bruit .  No edema Pulmonary/Chest: Effort normal and breath sounds normal. No respiratory distress. No has no wheezes. No rales.  Skin: Skin is warm and dry. Not diaphoretic.  Psychiatric: Normal mood and affect. Behavior is normal.      Assessment & Plan:    See Problem List for Assessment and Plan of chronic medical problems.

## 2017-11-09 NOTE — Assessment & Plan Note (Addendum)
No chest pain, palpitations, sob Encouraged increasing his exercise Continue current medications-advised he does need to continue his baby aspirin CMP, CBC, TSH

## 2017-11-09 NOTE — Telephone Encounter (Signed)
Pharmacy requesting a ninety day rx. 

## 2017-11-09 NOTE — Assessment & Plan Note (Signed)
Following with urology

## 2017-11-09 NOTE — Assessment & Plan Note (Signed)
Check lipid panel  Continue daily statin Regular exercise and healthy diet encouraged  

## 2017-11-09 NOTE — Assessment & Plan Note (Signed)
GERD controlled Continue daily medication  

## 2017-11-09 NOTE — Patient Instructions (Addendum)
  Test(s) ordered today. Your results will be released to [place] (or called to you) after review, usually within 72hours after test completion. If any changes need to be made, you will be notified at that same time.  All other Health Maintenance issues reviewed.   All recommended immunizations and age-appropriate screenings are up-to-date or discussed.  Tetanus and prevnar pneumonia immunizations administered today.   Medications reviewed and updated.  No changes recommended at this time.   Please followup in 6 months

## 2017-11-09 NOTE — Assessment & Plan Note (Signed)
BP well controlled Current regimen effective and well tolerated Continue current medications at current doses cmp  

## 2017-11-09 NOTE — Assessment & Plan Note (Signed)
Check a1c Low sugar / carb diet Stressed regular exercise, weight loss  

## 2017-11-10 ENCOUNTER — Encounter: Payer: Self-pay | Admitting: Internal Medicine

## 2017-11-10 LAB — LIPID PANEL
Cholesterol: 189 mg/dL (ref 0–200)
HDL: 72 mg/dL (ref >39.00)
LDL Cholesterol: 93 mg/dL (ref 0–99)
NonHDL: 117.39
Total CHOL/HDL Ratio: 3
Triglycerides: 124 mg/dL (ref 0.0–149.0)
VLDL: 24.8 mg/dL (ref 0.0–40.0)

## 2017-11-10 LAB — COMPREHENSIVE METABOLIC PANEL
ALT: 22 U/L (ref 0–53)
AST: 21 U/L (ref 0–37)
Albumin: 4.3 g/dL (ref 3.5–5.2)
Alkaline Phosphatase: 64 U/L (ref 39–117)
BUN: 15 mg/dL (ref 6–23)
CO2: 25 mEq/L (ref 19–32)
Calcium: 9.3 mg/dL (ref 8.4–10.5)
Chloride: 105 mEq/L (ref 96–112)
Creatinine, Ser: 1 mg/dL (ref 0.40–1.50)
GFR: 76.5 mL/min (ref >60.00)
Glucose, Bld: 101 mg/dL — ABNORMAL HIGH (ref 70–99)
Potassium: 4.6 mEq/L (ref 3.5–5.1)
Sodium: 140 mEq/L (ref 135–145)
Total Bilirubin: 0.5 mg/dL (ref 0.2–1.2)
Total Protein: 6.7 g/dL (ref 6.0–8.3)

## 2017-11-11 ENCOUNTER — Telehealth: Payer: Self-pay | Admitting: *Deleted

## 2017-11-11 NOTE — Telephone Encounter (Signed)
Faxed CPAP supply orders to [facility] for supplies ordered 11/08/17.

## 2017-11-12 ENCOUNTER — Other Ambulatory Visit: Payer: Self-pay | Admitting: Cardiovascular Disease

## 2017-11-14 NOTE — Telephone Encounter (Signed)
Please review for refill, Thanks !  

## 2017-11-17 ENCOUNTER — Ambulatory Visit: Payer: Medicare Other | Admitting: Cardiovascular Disease

## 2017-12-21 ENCOUNTER — Telehealth: Payer: Self-pay | Admitting: *Deleted

## 2017-12-21 NOTE — Telephone Encounter (Signed)
Faxed order for CPAP supplies ordered on 11/08/17 to [facility].

## 2017-12-22 ENCOUNTER — Telehealth: Payer: Self-pay | Admitting: Emergency Medicine

## 2017-12-22 NOTE — Telephone Encounter (Signed)
Copied from [place] [id number]. Topic: Inquiry >> Dec 22, 2017  8:28 AM Conception Chancy, NT wrote: Reason for CRM: patient wife is calling and states they do a lot of traveling and would like to know if they need a form of measle protection. Please contact.

## 2017-12-22 NOTE — Telephone Encounter (Signed)
Spoke with pt wife to advise that insurance may not cover titer for measles. Out of pocket cost is $160. Advised pt that immunization takes 2-3 weeks to go into affect. Pt's wife states that she will contact us after the return from a cruise if they would like blood work done.

## 2018-01-12 DIAGNOSIS — H01004 Unspecified blepharitis left upper eyelid: Secondary | ICD-10-CM | POA: Diagnosis not present

## 2018-01-12 DIAGNOSIS — H01001 Unspecified blepharitis right upper eyelid: Secondary | ICD-10-CM | POA: Diagnosis not present

## 2018-01-12 DIAGNOSIS — H01002 Unspecified blepharitis right lower eyelid: Secondary | ICD-10-CM | POA: Diagnosis not present

## 2018-01-12 DIAGNOSIS — H04123 Dry eye syndrome of bilateral lacrimal glands: Secondary | ICD-10-CM | POA: Diagnosis not present

## 2018-01-12 DIAGNOSIS — H01005 Unspecified blepharitis left lower eyelid: Secondary | ICD-10-CM | POA: Diagnosis not present

## 2018-01-31 ENCOUNTER — Telehealth: Payer: Self-pay

## 2018-01-31 MED ORDER — ZOSTER VAC RECOMB ADJUVANTED 50 MCG/0.5ML IM SUSR: 0.5000 mL | Freq: Once | INTRAMUSCULAR | 1 refills | Status: AC

## 2018-02-06 NOTE — Telephone Encounter (Signed)
error 

## 2018-02-09 DIAGNOSIS — K648 Other hemorrhoids: Secondary | ICD-10-CM | POA: Diagnosis not present

## 2018-02-09 DIAGNOSIS — D125 Benign neoplasm of sigmoid colon: Secondary | ICD-10-CM | POA: Diagnosis not present

## 2018-02-09 DIAGNOSIS — K5730 Diverticulosis of large intestine without perforation or abscess without bleeding: Secondary | ICD-10-CM | POA: Diagnosis not present

## 2018-02-09 DIAGNOSIS — Z86010 Personal history of colonic polyps: Secondary | ICD-10-CM | POA: Diagnosis not present

## 2018-02-09 DIAGNOSIS — Z1211 Encounter for screening for malignant neoplasm of colon: Secondary | ICD-10-CM | POA: Diagnosis not present

## 2018-02-09 DIAGNOSIS — K635 Polyp of colon: Secondary | ICD-10-CM | POA: Diagnosis not present

## 2018-02-09 LAB — HM COLONOSCOPY

## 2018-02-15 ENCOUNTER — Encounter: Payer: Self-pay | Admitting: Internal Medicine

## 2018-02-15 DIAGNOSIS — K648 Other hemorrhoids: Secondary | ICD-10-CM | POA: Insufficient documentation

## 2018-02-15 DIAGNOSIS — Z86010 Personal history of colonic polyps: Secondary | ICD-10-CM | POA: Insufficient documentation

## 2018-02-24 ENCOUNTER — Other Ambulatory Visit: Payer: Self-pay | Admitting: Cardiovascular Disease

## 2018-03-23 IMAGING — CT CT CHEST W/O CM
2 of 3 series · 15 of 36 positions shown, 18 images · non-contrast
Comparison: 11/22/2016

CLINICAL DATA: 3 month follow up of abnormal CT. Ground glass
opacities. Hx of slight pneumonia in [place] upon returning from
[place].

EXAM:
CT CHEST WITHOUT CONTRAST
TECHNIQUE: Multidetector CT imaging of the chest was performed following the
standard protocol without IV contrast.

[Series 2: thorax · axial · 0.83mm/px · z∈[-358,-70]mm · 12 of 170 slices shown, 15 images]
[im 13/170  mediastinal]
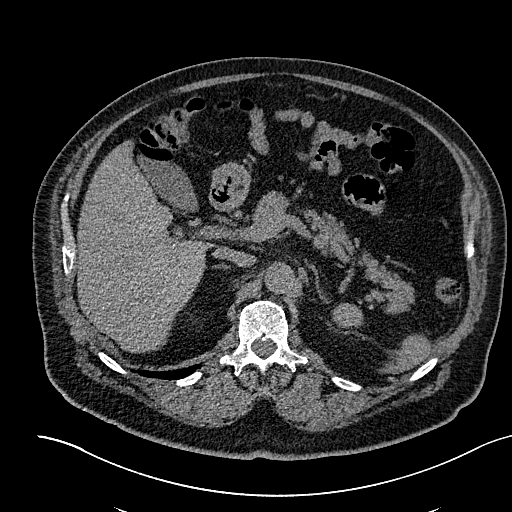
[im 13/170  lung]
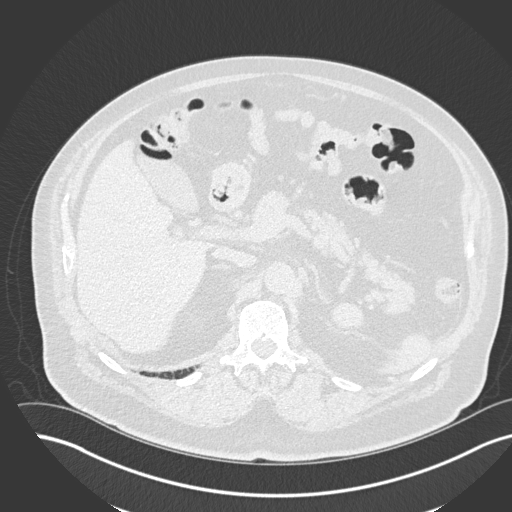
[im 26/170  lung]
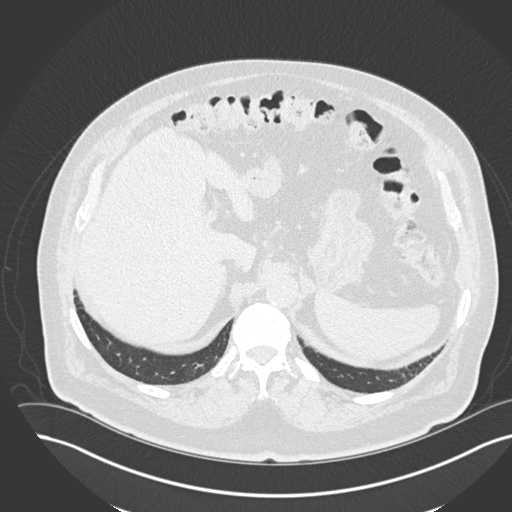
[im 38/170  lung]
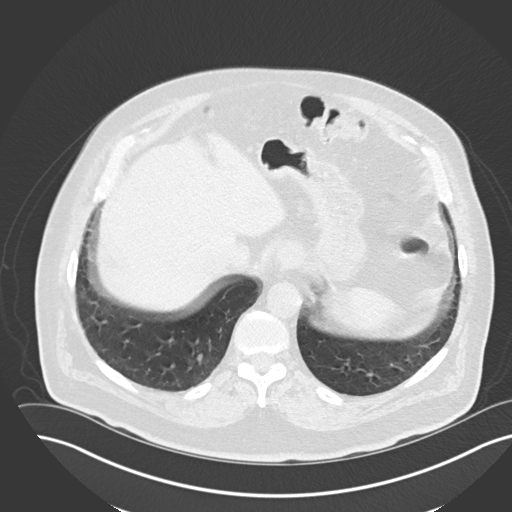
[im 51/170  lung]
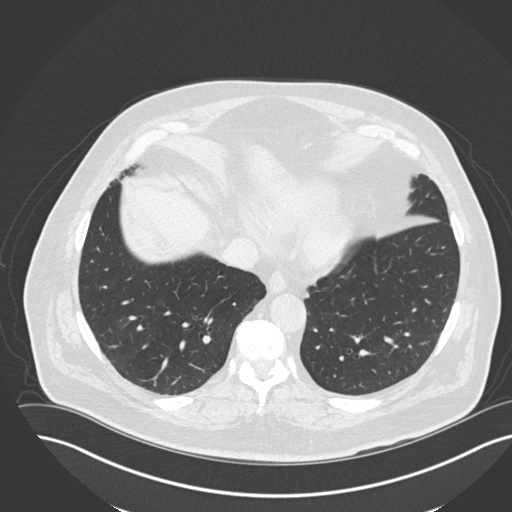
[im 63/170  mediastinal]
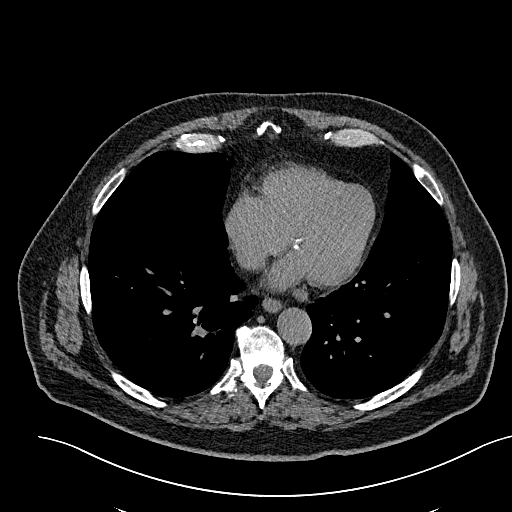
[im 63/170  lung]
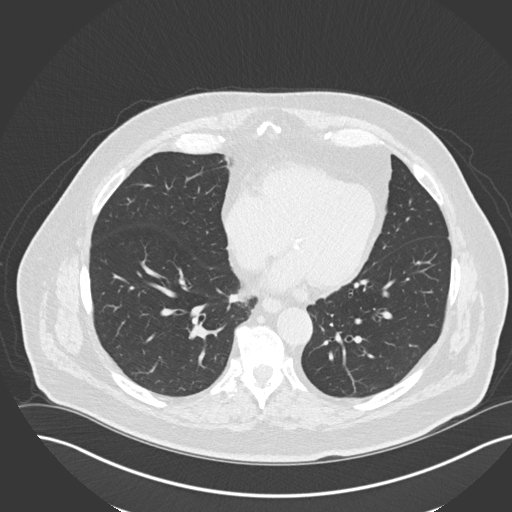
[im 76/170  lung]
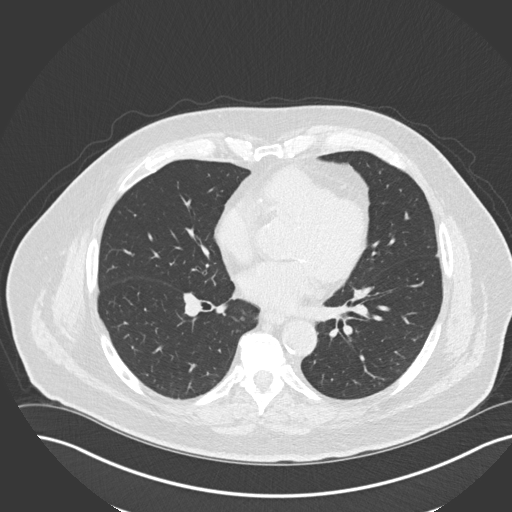
[im 94/170  lung]
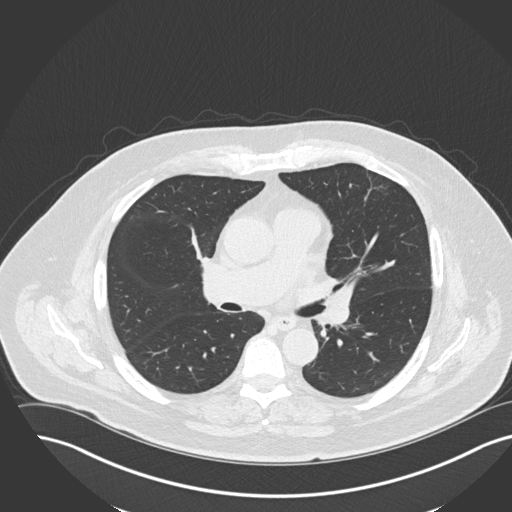
[im 107/170  lung]
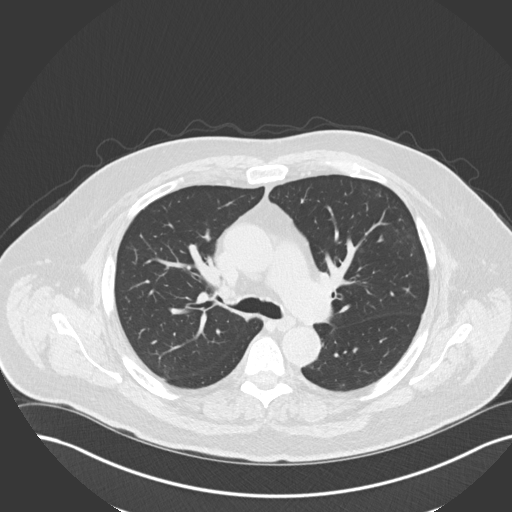
[im 119/170  mediastinal]
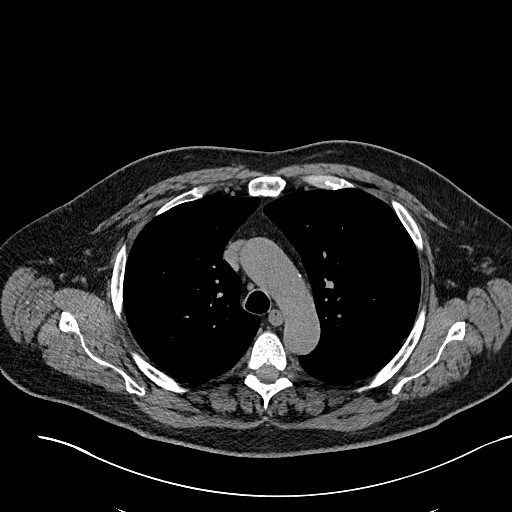
[im 119/170  lung]
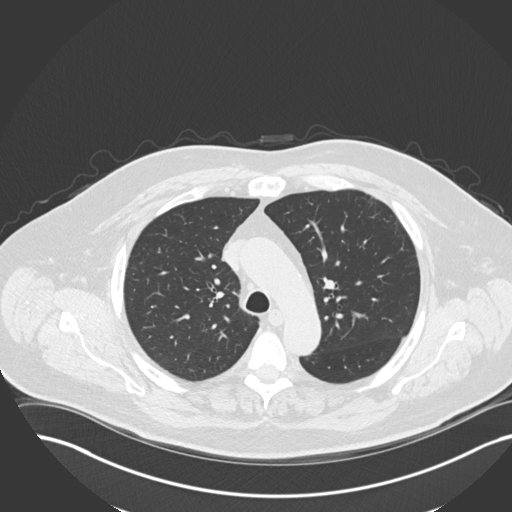
[im 132/170  lung]
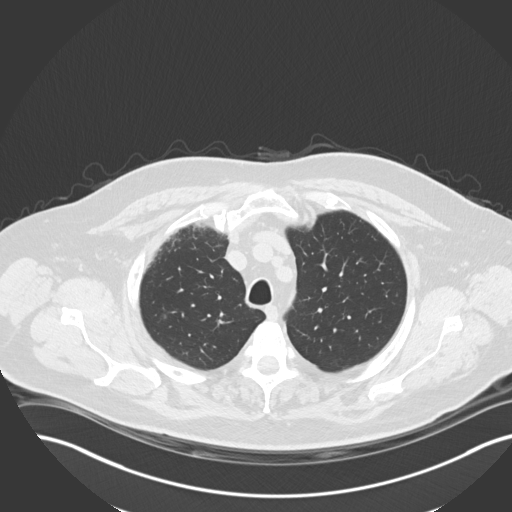
[im 144/170  lung]
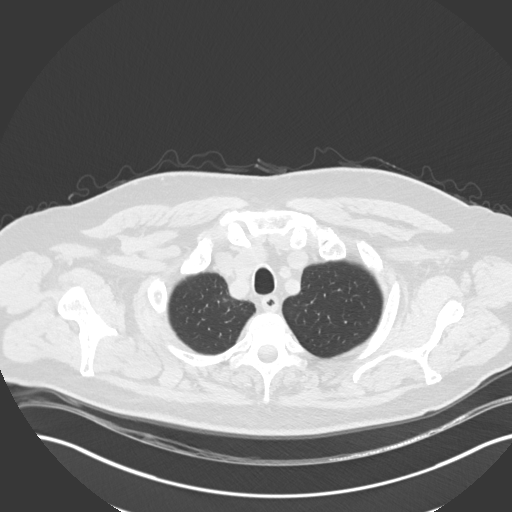
[im 157/170  lung]
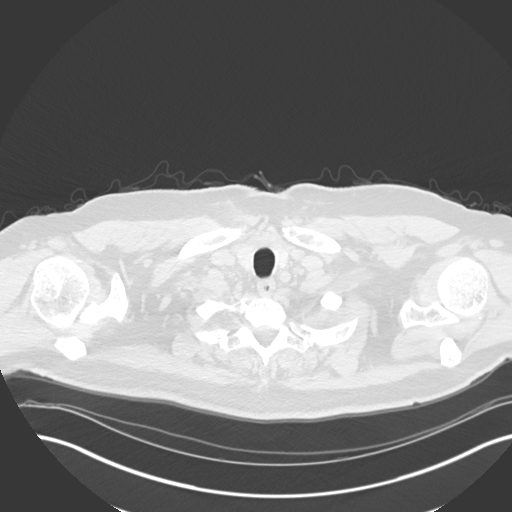

[Series 5: coronal · coronal · 0.66mm/px · 3 of 160 slices shown]
[im 32/160  lung]
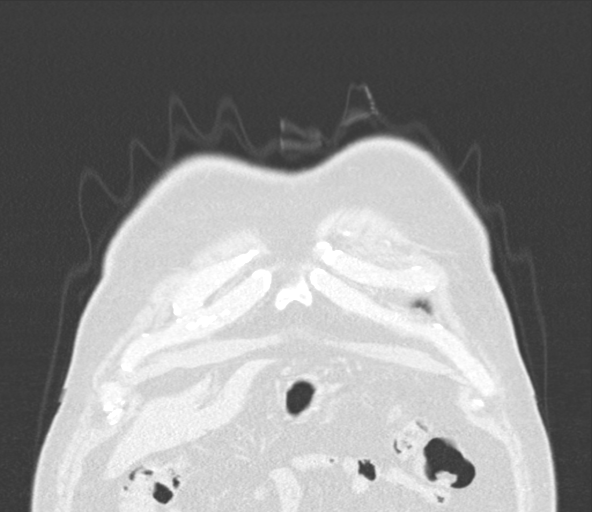
[im 64/160  lung]
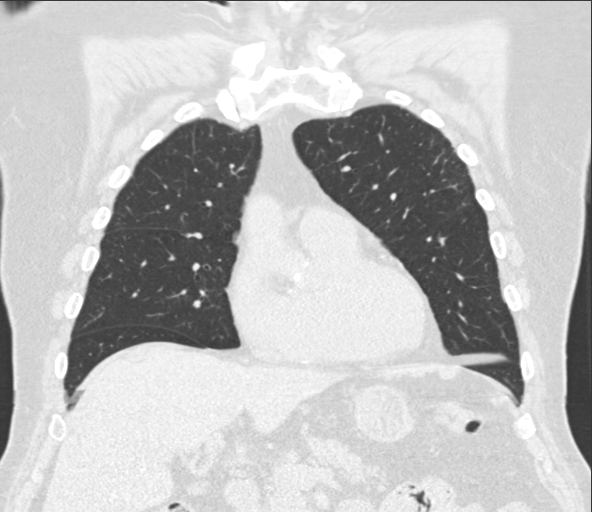
[im 96/160  lung]
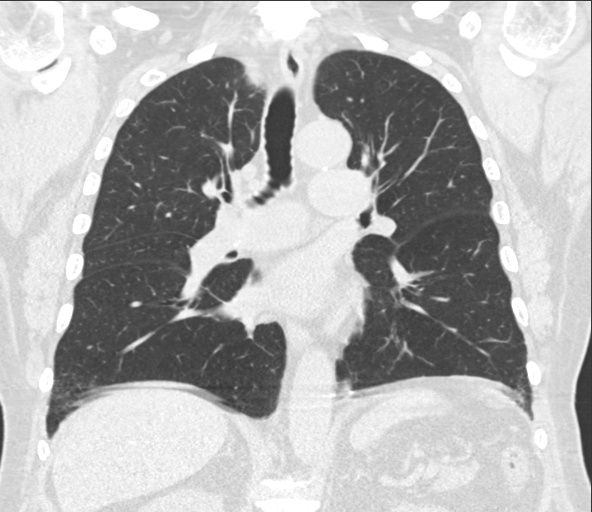

[15 of 36 positions shown; findings below may reference images not displayed]

FINDINGS: Cardiovascular: Heart is mildly enlarged. There are coronary artery
calcifications. The thoracic aorta is atherosclerotic. No aneurysm.

Mediastinum/Nodes: The visualized portion of the thyroid gland has a
normal appearance. No mediastinal, hilar, or axillary adenopathy.
Esophagus is normal in appearance.

Lungs/Pleura: There has been interval resolution of ground-glass
opacity within the right upper lobe. No suspicious pulmonary
nodules. No pleural effusions or consolidations are identified. No
emphysematous changes.

Upper Abdomen: Gallbladder is present. Upper abdomen is
unremarkable.

Musculoskeletal: Degenerative changes are seen in thoracic spine.
IMPRESSION: 1. Interval resolution of right upper lobe ground-glass opacity.
2. Coronary artery disease.
3.  Aortic Atherosclerosis ([facility]16[facility]-4[facility].W).

## 2018-03-30 DIAGNOSIS — H04123 Dry eye syndrome of bilateral lacrimal glands: Secondary | ICD-10-CM | POA: Diagnosis not present

## 2018-03-30 DIAGNOSIS — H524 Presbyopia: Secondary | ICD-10-CM | POA: Diagnosis not present

## 2018-04-25 DIAGNOSIS — Z23 Encounter for immunization: Secondary | ICD-10-CM | POA: Diagnosis not present

## 2018-05-08 ENCOUNTER — Ambulatory Visit (INDEPENDENT_AMBULATORY_CARE_PROVIDER_SITE_OTHER): Payer: Medicare Other | Admitting: Cardiovascular Disease

## 2018-05-08 ENCOUNTER — Encounter: Payer: Self-pay | Admitting: Cardiovascular Disease

## 2018-05-08 VITALS — BP 130/80 | HR 71 | Ht 68.0 in | Wt 216.0 lb

## 2018-05-08 DIAGNOSIS — E7800 Pure hypercholesterolemia, unspecified: Secondary | ICD-10-CM | POA: Diagnosis not present

## 2018-05-08 DIAGNOSIS — I2510 Atherosclerotic heart disease of native coronary artery without angina pectoris: Secondary | ICD-10-CM | POA: Diagnosis not present

## 2018-05-08 DIAGNOSIS — I10 Essential (primary) hypertension: Secondary | ICD-10-CM | POA: Diagnosis not present

## 2018-05-08 DIAGNOSIS — Z9989 Dependence on other enabling machines and devices: Secondary | ICD-10-CM

## 2018-05-08 DIAGNOSIS — G4733 Obstructive sleep apnea (adult) (pediatric): Secondary | ICD-10-CM | POA: Insufficient documentation

## 2018-05-08 NOTE — Patient Instructions (Addendum)
  Test(s) ordered today. Your results will be released to MyChart (or called to you) after review, usually within 72hours after test completion. If any changes need to be made, you will be notified at that same time.  Medications reviewed and updated.  No changes recommended at this time.    Please followup in 6 months   

## 2018-05-08 NOTE — Progress Notes (Signed)
Cardiology Office Note   Date:  05/08/2018   ID:  [name], DOB [birth year], MRN [id number]  PCP:  Binnie Rail, MD  Cardiologist:   Skeet Latch, MD   No chief complaint on file.    History of Present Illness: Jason Compton is a 82 y.o. male with hypertension, hyperlipidemia, OSA on CPAP,  and Celiac disease who presents for follow up.  Mr. Hoagland was previously a patient of [name].  He has been doing well and denies any chest pain or shortness of breath.  He continues on his travels.  Since his last appointment Mr. Vanosdol has been doing well.  His only complaint is some left shoulder pain.  He saw orthopedics and they recommended conservative treatment.  He has been diagnosed with sleep apnea and was started on CPAP.  He notices that he sleeps better and that he has less nocturia.  He walks for exercise and averages 1.8 miles per day.  He does more when he is traveling.  He also goes for a walk twice per week.  He and his wife eat out for lunch most days of the week.  They otherwise cook meals at home.  He tries to be mindful of what he eats but does not follow any particular diet.  He recently celebrated his 82th birthday by visiting family in [place].  His next trip is to the [place], [place], and [place].   Past Medical History:  Diagnosis Date  . Exogenous obesity   . GERD (gastroesophageal reflux disease)   . History of hiatal hernia   . Hyperlipidemia   . Hypertension   . PVC's (premature ventricular contractions)   . Skin cancer     Past Surgical History:  Procedure Laterality Date  . CARDIOVASCULAR STRESS TEST  04/18/2009   NORMAL  . CATARACT EXTRACTION       Current Outpatient Medications  Medication Sig Dispense Refill  . amLODipine (NORVASC) 5 MG tablet TAKE 1 TABLET BY MOUTH EVERY DAY 90 tablet 0  . aspirin 81 MG tablet Take 81 mg by mouth daily.      Marland Kitchen atorvastatin (LIPITOR) 40 MG tablet TAKE 1 TABLET (40 MG TOTAL) BY MOUTH  DAILY. FOLLOW UP NEEDED 30 tablet 6  . cholecalciferol (VITAMIN D) 1000 UNITS tablet Take 1,000 Units by mouth daily.     . Multiple Vitamin (MULTIVITAMIN) tablet Take 1 tablet by mouth daily.      Marland Kitchen omeprazole (PRILOSEC) 20 MG capsule Take 20 mg by mouth daily.     . vitamin C (ASCORBIC ACID) 500 MG tablet Take 500 mg by mouth daily.       No current facility-administered medications for this visit.     Allergies:   Altace [ramipril]    Social History:  The patient  reports that he quit smoking about 11 years ago. He has never used smokeless tobacco. He reports that he drinks alcohol. He reports that he does not use drugs.   Family History:  The patient's family history includes Cancer in his father; Hypertension in his brother; Osteoporosis in his mother.    ROS:  Please see the history of present illness.   Otherwise, review of systems are positive for none.   All other systems are reviewed and negative.    PHYSICAL EXAM: VS:  BP 130/80   Pulse 71   Ht 5\' 8"  (1.727 m)   Wt 216 lb (98 kg)   BMI 32.84 kg/m  ,  BMI Body mass index is 32.84 kg/m. GENERAL:  Well appearing HEENT: Pupils equal round and reactive, fundi not visualized, oral mucosa unremarkable NECK:  No jugular venous distention, waveform within normal limits, carotid upstroke brisk and symmetric, no bruits LUNGS:  Clear to auscultation bilaterally HEART:  RRR.  PMI not displaced or sustained,S1 and S2 within normal limits, no S3, no S4, no clicks, no rubs, no murmurs ABD:  Flat, positive bowel sounds normal in frequency in pitch, no bruits, no rebound, no guarding, no midline pulsatile mass, no hepatomegaly, no splenomegaly EXT:  2 plus pulses throughout, no edema, no cyanosis no clubbing SKIN:  No rashes no nodules NEURO:  Cranial nerves II through XII grossly intact, motor grossly intact throughout PSYCH:  Cognitively intact, oriented to person place and time   EKG:  EKG is ordered today. The ekg ordered 03/30/16  demonstrates sinus rhythm rate 71 bpm.  Cannot rule out prior inferior infarct.  Left axis deviation.  Unchanged from prior.   11/19/16: Sinus rhythm. Rate 72 bpm. Low voltage limb leads. Prior inferior infarct 05/08/2018: Sinus rhythm.  LAD FB.  Cannot rule out prior inferior infarct.  Low voltage.  Recent Labs: 11/09/2017: ALT 22; BUN 15; Creatinine, Ser 1.00; Hemoglobin 15.6; Platelets 211.0; Potassium 4.6; Sodium 140; TSH 1.12    Lipid Panel    Component Value Date/Time   CHOL 189 11/09/2017 1542   CHOL 165 05/05/2017 0832   TRIG 124.0 11/09/2017 1542   HDL 72.00 11/09/2017 1542   HDL 68 05/05/2017 0832   CHOLHDL 3 11/09/2017 1542   VLDL 24.8 11/09/2017 1542   LDLCALC 93 11/09/2017 1542   LDLCALC 86 05/05/2017 0832   LDLDIRECT 189.7 12/10/2011 0901      Wt Readings from Last 3 Encounters:  05/08/18 216 lb (98 kg)  11/09/17 222 lb (100.7 kg)  10/18/17 222 lb 6.4 oz (100.9 kg)      ASSESSMENT AND PLAN:  # Hypertension:  Blood pressure  He was initially elevated but better on repeat. Continue amlodipine.    # Hyperlipidemia.  LDL 93 10/2017  He was noted to have coronary calcifications on chest CT. We will repeat lipids and a CMP. If his LDL is greater than 70 he will work on diet and exercise for the next 3 months prior to rechecking.  Continue atorvastatin.  # Coronary calcification:  Continue aspirin 81 mg daily.  Increase exercise to at least 30-40 minutes most days of the week.  Lipid management as above.   Current medicines are reviewed at length with the patient today.  The patient does not have concerns regarding medicines.  The following changes have been made:  None  Labs/ tests ordered today include:   No orders of the defined types were placed in this encounter.    Disposition:   FU with Mayre Bury C. Oval Linsey, MD, [facility] in 6 months.     Signed, Ashantae Pangallo C. Oval Linsey, MD, [facility]  05/08/2018 1:39 PM    [facility]

## 2018-05-08 NOTE — Progress Notes (Signed)
Subjective:    Patient ID: Jason Compton, male    DOB: 04-15-1936, 82 y.o.   MRN: [id number]  HPI The patient is here for follow up.  CAD, Hypertension: He is taking his medication daily. He is compliant with a low sodium diet.  He denies chest pain, palpitations, edema, shortness of breath and regular headaches. He is walks a lot but not as regimented exercise.  He does not monitor his blood pressure at home.    Hyperlipidemia: He is taking his medication daily. He is compliant with a low fat/cholesterol diet. He is exercising regularly. He denies myalgias.   Prediabetes:  He is compliant with a low sugar/carbohydrate diet.  He is exercising regularly.  GERD:  He is taking his medication daily as prescribed.  He denies any GERD symptoms and feels his GERD is well controlled.   OSA:  He uses his cpap nightly.  His BP is lower since using his CPAP machine nightly.  He is going to the bathroom less at night.  He has not noticed his significant change in energy level.    Medications and allergies reviewed with patient and updated if appropriate.  Patient Active Problem List   Diagnosis Date Noted  . OSA on CPAP 05/08/2018  . Cervical spondylosis without myelopathy 10/13/2017  . Aortic atherosclerosis ([place]) 04/28/2017  . Coronary artery disease 04/28/2017  . Prediabetes 11/02/2016  . GERD (gastroesophageal reflux disease) 10/29/2016  . Celiac disease 07/30/2013  . Hard of hearing 07/30/2013  . Essential hypertension 03/08/2011  . Hypercholesterolemia 03/08/2011  . PSA elevation 03/08/2011    Current Outpatient Medications on File Prior to Visit  Medication Sig Dispense Refill  . amLODipine (NORVASC) 5 MG tablet TAKE 1 TABLET BY MOUTH EVERY DAY 90 tablet 0  . aspirin 81 MG tablet Take 81 mg by mouth daily.      Marland Kitchen atorvastatin (LIPITOR) 40 MG tablet TAKE 1 TABLET (40 MG TOTAL) BY MOUTH DAILY. FOLLOW UP NEEDED 30 tablet 6  . cholecalciferol (VITAMIN D) 1000 UNITS tablet Take  1,000 Units by mouth daily.     . Multiple Vitamin (MULTIVITAMIN) tablet Take 1 tablet by mouth daily.      Marland Kitchen omeprazole (PRILOSEC) 20 MG capsule Take 20 mg by mouth daily.     . vitamin C (ASCORBIC ACID) 500 MG tablet Take 500 mg by mouth daily.       No current facility-administered medications on file prior to visit.     Past Medical History:  Diagnosis Date  . Exogenous obesity   . GERD (gastroesophageal reflux disease)   . History of hiatal hernia   . Hyperlipidemia   . Hypertension   . PVC's (premature ventricular contractions)   . Skin cancer     Past Surgical History:  Procedure Laterality Date  . CARDIOVASCULAR STRESS TEST  04/18/2009   NORMAL  . CATARACT EXTRACTION      Social History   Socioeconomic History  . Marital status: Married    Spouse name: Not on file  . Number of children: Not on file  . Years of education: Not on file  . Highest education level: Not on file  Occupational History  . Not on file  Social Needs  . Financial resource strain: Not on file  . Food insecurity:    Worry: Not on file    Inability: Not on file  . Transportation needs:    Medical: Not on file    Non-medical: Not on file  Tobacco Use  . Smoking status: Former Smoker    Last attempt to quit: 08/23/2006    Years since quitting: 11.7  . Smokeless tobacco: Never Used  Substance and Sexual Activity  . Alcohol use: Yes    Alcohol/week: 0.0 standard drinks    Comment: wine  . Drug use: No  . Sexual activity: Not on file  Lifestyle  . Physical activity:    Days per week: Not on file    Minutes per session: Not on file  . Stress: Not on file  Relationships  . Social connections:    Talks on phone: Not on file    Gets together: Not on file    Attends religious service: Not on file    Active member of club or organization: Not on file    Attends meetings of clubs or organizations: Not on file    Relationship status: Not on file  Other Topics Concern  . Not on file    Social History Narrative  . Not on file    Family History  Problem Relation Age of Onset  . Osteoporosis Mother   . Cancer Father        skin  . Hypertension Brother     Review of Systems  Constitutional: Negative for chills and fever.  Respiratory: Negative for cough, shortness of breath and wheezing.   Cardiovascular: Negative for chest pain, palpitations and leg swelling.  Neurological: Negative for headaches.       Objective:   Vitals:   05/09/18 0822  BP: 138/76  Pulse: 73  Resp: 16  Temp: 98.8 F (37.1 C)  SpO2: 94%   BP Readings from Last 3 Encounters:  05/09/18 138/76  05/08/18 130/80  11/09/17 126/74   Wt Readings from Last 3 Encounters:  05/09/18 215 lb 12.8 oz (97.9 kg)  05/08/18 216 lb (98 kg)  11/09/17 222 lb (100.7 kg)   Body mass index is 32.81 kg/m.   Physical Exam    Constitutional: Appears well-developed and well-nourished. No distress.  HENT:  Head: Normocephalic and atraumatic.  Neck: Neck supple. No tracheal deviation present. No thyromegaly present.  No cervical lymphadenopathy Cardiovascular: Normal rate, regular rhythm and normal heart sounds.   2/6 systolic murmur heard. No carotid bruit .  No edema Pulmonary/Chest: Effort normal and breath sounds normal. No respiratory distress. No has no wheezes. No rales.  Skin: Skin is warm and dry. Not diaphoretic.  Psychiatric: Normal mood and affect. Behavior is normal.      Assessment & Plan:    See Problem List for Assessment and Plan of chronic medical problems.

## 2018-05-08 NOTE — Patient Instructions (Signed)
Medication Instructions:  Your physician recommends that you continue on your current medications as directed. Please refer to the Current Medication list given to you today.  Labwork: NONE  Testing/Procedures: NONE  Follow-Up: Your physician wants you to follow-up in: 6 MONTHS  You will receive a reminder letter in the mail two months in advance. If you don't receive a letter, please call our office to schedule the follow-up appointment.  Any Other Special Instructions Will Be Listed Below (If Applicable).  TRY TO EXERCISE 150 MINUTES EACH WEEK   If you need a refill on your cardiac medications before your next appointment, please call your pharmacy.

## 2018-05-09 ENCOUNTER — Other Ambulatory Visit (INDEPENDENT_AMBULATORY_CARE_PROVIDER_SITE_OTHER): Payer: Medicare Other

## 2018-05-09 ENCOUNTER — Ambulatory Visit (INDEPENDENT_AMBULATORY_CARE_PROVIDER_SITE_OTHER): Payer: Medicare Other | Admitting: Internal Medicine

## 2018-05-09 ENCOUNTER — Encounter: Payer: Self-pay | Admitting: Internal Medicine

## 2018-05-09 VITALS — BP 138/76 | HR 73 | Temp 98.8°F | Resp 16 | Ht 68.0 in | Wt 215.8 lb

## 2018-05-09 DIAGNOSIS — G4733 Obstructive sleep apnea (adult) (pediatric): Secondary | ICD-10-CM | POA: Diagnosis not present

## 2018-05-09 DIAGNOSIS — E7800 Pure hypercholesterolemia, unspecified: Secondary | ICD-10-CM

## 2018-05-09 DIAGNOSIS — I10 Essential (primary) hypertension: Secondary | ICD-10-CM | POA: Diagnosis not present

## 2018-05-09 DIAGNOSIS — K219 Gastro-esophageal reflux disease without esophagitis: Secondary | ICD-10-CM | POA: Diagnosis not present

## 2018-05-09 DIAGNOSIS — Z9989 Dependence on other enabling machines and devices: Secondary | ICD-10-CM

## 2018-05-09 DIAGNOSIS — R7303 Prediabetes: Secondary | ICD-10-CM | POA: Diagnosis not present

## 2018-05-09 DIAGNOSIS — I2510 Atherosclerotic heart disease of native coronary artery without angina pectoris: Secondary | ICD-10-CM

## 2018-05-09 LAB — LIPID PANEL
Cholesterol: 166 mg/dL (ref 0–200)
HDL: 64.6 mg/dL (ref >39.00)
LDL Cholesterol: 89 mg/dL (ref 0–99)
NonHDL: 101.22
Total CHOL/HDL Ratio: 3
Triglycerides: 63 mg/dL (ref 0.0–149.0)
VLDL: 12.6 mg/dL (ref 0.0–40.0)

## 2018-05-09 LAB — COMPREHENSIVE METABOLIC PANEL
ALT: 21 U/L (ref 0–53)
AST: 20 U/L (ref 0–37)
Albumin: 4.1 g/dL (ref 3.5–5.2)
Alkaline Phosphatase: 61 U/L (ref 39–117)
BUN: 14 mg/dL (ref 6–23)
CO2: 26 mEq/L (ref 19–32)
Calcium: 9.2 mg/dL (ref 8.4–10.5)
Chloride: 103 mEq/L (ref 96–112)
Creatinine, Ser: 0.8 mg/dL (ref 0.40–1.50)
GFR: 98.85 mL/min (ref >60.00)
Glucose, Bld: 116 mg/dL — ABNORMAL HIGH (ref 70–99)
Potassium: 4.8 mEq/L (ref 3.5–5.1)
Sodium: 139 mEq/L (ref 135–145)
Total Bilirubin: 1.1 mg/dL (ref 0.2–1.2)
Total Protein: 6.9 g/dL (ref 6.0–8.3)

## 2018-05-09 LAB — HEMOGLOBIN A1C: Hgb A1c MFr Bld: 5.9 % (ref 4.6–6.5)

## 2018-05-09 NOTE — Assessment & Plan Note (Signed)
GERD controlled Continue daily medication  

## 2018-05-09 NOTE — Assessment & Plan Note (Signed)
Continue aspirin, Lipitor Check CMP, lipid panel No concerning symptoms

## 2018-05-09 NOTE — Assessment & Plan Note (Signed)
Check lipid panel  Continue daily statin Regular exercise and healthy diet encouraged  

## 2018-05-09 NOTE — Assessment & Plan Note (Signed)
Compliant with CPAP 

## 2018-05-09 NOTE — Assessment & Plan Note (Signed)
BP well controlled Current regimen effective and well tolerated Continue current medications at current doses cmp  

## 2018-05-09 NOTE — Assessment & Plan Note (Signed)
Check a1c Low sugar / carb diet Stressed regular exercise, weight loss  

## 2018-05-23 ENCOUNTER — Other Ambulatory Visit: Payer: Self-pay | Admitting: Cardiovascular Disease

## 2018-06-08 DIAGNOSIS — M7130 Other bursal cyst, unspecified site: Secondary | ICD-10-CM | POA: Diagnosis not present

## 2018-06-08 DIAGNOSIS — Z85828 Personal history of other malignant neoplasm of skin: Secondary | ICD-10-CM | POA: Diagnosis not present

## 2018-06-08 DIAGNOSIS — D485 Neoplasm of uncertain behavior of skin: Secondary | ICD-10-CM | POA: Diagnosis not present

## 2018-06-08 DIAGNOSIS — L820 Inflamed seborrheic keratosis: Secondary | ICD-10-CM | POA: Diagnosis not present

## 2018-06-08 DIAGNOSIS — L821 Other seborrheic keratosis: Secondary | ICD-10-CM | POA: Diagnosis not present

## 2018-06-08 DIAGNOSIS — L438 Other lichen planus: Secondary | ICD-10-CM | POA: Diagnosis not present

## 2018-06-08 DIAGNOSIS — L570 Actinic keratosis: Secondary | ICD-10-CM | POA: Diagnosis not present

## 2018-06-14 DIAGNOSIS — N401 Enlarged prostate with lower urinary tract symptoms: Secondary | ICD-10-CM | POA: Diagnosis not present

## 2018-06-14 DIAGNOSIS — R351 Nocturia: Secondary | ICD-10-CM | POA: Diagnosis not present

## 2018-06-14 DIAGNOSIS — C61 Malignant neoplasm of prostate: Secondary | ICD-10-CM | POA: Diagnosis not present

## 2018-07-17 ENCOUNTER — Telehealth: Payer: Self-pay | Admitting: Internal Medicine

## 2018-07-17 MED ORDER — PENCICLOVIR 1 % EX CREA: 1.0000 "application " | TOPICAL_CREAM | CUTANEOUS | 3 refills | Status: DC

## 2018-07-17 NOTE — Telephone Encounter (Signed)
sent 

## 2018-07-17 NOTE — Telephone Encounter (Signed)
Copied from [place] [id number]. Topic: Quick Communication - Rx Refill/Question >> Jul 17, 2018  2:37 PM Scherrie Gerlach wrote: Medication: penciclovir (DENAVIR) 1 % cream  Pt uses this for cold sores, and was prescribed by Dr Mare Ferrari.  Pt requesting refill sent to  [id number] - [place], [place] [phone] (Phone) [phone] (Fax)

## 2018-07-18 NOTE — Telephone Encounter (Signed)
LVM letting pt know that rx has been sent to pharmacy.

## 2018-07-24 NOTE — Progress Notes (Signed)
Subjective:    Patient ID: Jason Compton, male    DOB: [birth year], 82 y.o.   MRN: [id number]  HPI He is here for an acute visit for cold symptoms.  His symptoms started 4 days ago.  His symptoms started after doing yard work and bring up [employer].  He is experiencing sneezing and rhinorrhea.  When he lays down the symptoms resolve.  He denies other symptoms including cough, sinus pain and fever.   He has tried taking Afrin before bedtime.  On occasion he gets a cold sore and has used Denavir cream in the past.  I did send a prescription for this but it was not covered.  He wondered if there was an alternative cream.   Medications and allergies reviewed with patient and updated if appropriate.  Patient Active Problem List   Diagnosis Date Noted  . OSA on CPAP 05/08/2018  . Cervical spondylosis without myelopathy 10/13/2017  . Aortic atherosclerosis ([place]) 04/28/2017  . Coronary artery disease 04/28/2017  . Prediabetes 11/02/2016  . GERD (gastroesophageal reflux disease) 10/29/2016  . Celiac disease 07/30/2013  . Hard of hearing 07/30/2013  . Essential hypertension 03/08/2011  . Hypercholesterolemia 03/08/2011  . PSA elevation 03/08/2011    Current Outpatient Medications on File Prior to Visit  Medication Sig Dispense Refill  . amLODipine (NORVASC) 5 MG tablet TAKE 1 TABLET BY MOUTH EVERY DAY 90 tablet 3  . aspirin 81 MG tablet Take 81 mg by mouth daily.      Marland Kitchen atorvastatin (LIPITOR) 40 MG tablet TAKE 1 TABLET (40 MG TOTAL) BY MOUTH DAILY. FOLLOW UP NEEDED 30 tablet 6  . cholecalciferol (VITAMIN D) 1000 UNITS tablet Take 1,000 Units by mouth daily.     . Multiple Vitamin (MULTIVITAMIN) tablet Take 1 tablet by mouth daily.      Marland Kitchen omeprazole (PRILOSEC) 20 MG capsule Take 20 mg by mouth daily.     Marland Kitchen penciclovir (DENAVIR) 1 % cream Apply 1 application topically every 2 (two) hours. 5 g 3  . vitamin C (ASCORBIC ACID) 500 MG tablet Take 500 mg by mouth daily.        No current facility-administered medications on file prior to visit.     Past Medical History:  Diagnosis Date  . Exogenous obesity   . GERD (gastroesophageal reflux disease)   . History of hiatal hernia   . Hyperlipidemia   . Hypertension   . PVC's (premature ventricular contractions)   . Skin cancer     Past Surgical History:  Procedure Laterality Date  . CARDIOVASCULAR STRESS TEST  04/18/2009   NORMAL  . CATARACT EXTRACTION      Social History   Socioeconomic History  . Marital status: Married    Spouse name: Not on file  . Number of children: Not on file  . Years of education: Not on file  . Highest education level: Not on file  Occupational History  . Not on file  Social Needs  . Financial resource strain: Not on file  . Food insecurity:    Worry: Not on file    Inability: Not on file  . Transportation needs:    Medical: Not on file    Non-medical: Not on file  Tobacco Use  . Smoking status: Former Smoker    Last attempt to quit: 08/23/2006    Years since quitting: 11.9  . Smokeless tobacco: Never Used  Substance and Sexual Activity  . Alcohol use: Yes    Alcohol/week:  0.0 standard drinks    Comment: wine  . Drug use: No  . Sexual activity: Not on file  Lifestyle  . Physical activity:    Days per week: Not on file    Minutes per session: Not on file  . Stress: Not on file  Relationships  . Social connections:    Talks on phone: Not on file    Gets together: Not on file    Attends religious service: Not on file    Active member of club or organization: Not on file    Attends meetings of clubs or organizations: Not on file    Relationship status: Not on file  Other Topics Concern  . Not on file  Social History Narrative  . Not on file    Family History  Problem Relation Age of Onset  . Osteoporosis Mother   . Cancer Father        skin  . Hypertension Brother     Review of Systems  Constitutional: Negative for chills and fever.  HENT:  Positive for rhinorrhea and sneezing. Negative for congestion, ear pain, postnasal drip, sinus pain and sore throat.   Respiratory: Negative for cough, shortness of breath and wheezing.   Cardiovascular: Negative for chest pain.  Neurological: Negative for dizziness, light-headedness and headaches.       Objective:   Vitals:   07/25/18 0952  BP: (!) 144/72  Pulse: 66  Resp: 16  Temp: 98.4 F (36.9 C)  SpO2: 99%   Filed Weights   07/25/18 0952  Weight: 214 lb (97.1 kg)   Body mass index is 32.54 kg/m.  Wt Readings from Last 3 Encounters:  07/25/18 214 lb (97.1 kg)  05/09/18 215 lb 12.8 oz (97.9 kg)  05/08/18 216 lb (98 kg)     Physical Exam GENERAL APPEARANCE: Appears stated age, well appearing, NAD EYES: conjunctiva clear, no icterus HEENT: bilateral tympanic membranes and ear canals normal, oropharynx with no erythema, no thyromegaly, trachea midline, no cervical or supraclavicular lymphadenopathy LUNGS: Clear to auscultation without wheeze or crackles, unlabored breathing, good air entry bilaterally CARDIOVASCULAR: Normal S1,S2 without murmurs, no edema SKIN: warm, dry        Assessment & Plan:   See Problem List for Assessment and Plan of chronic medical problems.

## 2018-07-25 ENCOUNTER — Encounter: Payer: Self-pay | Admitting: Internal Medicine

## 2018-07-25 ENCOUNTER — Ambulatory Visit (INDEPENDENT_AMBULATORY_CARE_PROVIDER_SITE_OTHER): Payer: Medicare Other | Admitting: Internal Medicine

## 2018-07-25 VITALS — BP 144/72 | HR 66 | Temp 98.4°F | Resp 16 | Ht 68.0 in | Wt 214.0 lb

## 2018-07-25 DIAGNOSIS — J3089 Other allergic rhinitis: Secondary | ICD-10-CM

## 2018-07-25 DIAGNOSIS — B009 Herpesviral infection, unspecified: Secondary | ICD-10-CM | POA: Insufficient documentation

## 2018-07-25 DIAGNOSIS — J309 Allergic rhinitis, unspecified: Secondary | ICD-10-CM | POA: Insufficient documentation

## 2018-07-25 DIAGNOSIS — I2510 Atherosclerotic heart disease of native coronary artery without angina pectoris: Secondary | ICD-10-CM

## 2018-07-25 MED ORDER — VALACYCLOVIR HCL 1 G PO TABS: ORAL_TABLET | ORAL | 11 refills | Status: DC

## 2018-07-25 NOTE — Patient Instructions (Addendum)
Your symptoms are likely allergic in nature   Take zrytec at night or claritin during the day.    You can also take flonase nasal spray.    Valtrex was sent to your pharmacy for your cold sores.   Take two pills twice daily for one day for an outbreak.      Allergic Rhinitis, Adult Allergic rhinitis is an allergic reaction that affects the mucous membrane inside the nose. It causes sneezing, a runny or stuffy nose, and the feeling of mucus going down the back of the throat (postnasal drip). Allergic rhinitis can be mild to severe. There are two types of allergic rhinitis:  Seasonal. This type is also called hay fever. It happens only during certain seasons.  Perennial. This type can happen at any time of the year.  What are the causes? This condition happens when the body's defense system (immune system) responds to certain harmless substances called allergens as though they were germs.  Seasonal allergic rhinitis is triggered by pollen, which can come from grasses, trees, and weeds. Perennial allergic rhinitis may be caused by:  House dust mites.  Pet dander.  Mold spores.  What are the signs or symptoms? Symptoms of this condition include:  Sneezing.  Runny or stuffy nose (nasal congestion).  Postnasal drip.  Itchy nose.  Tearing of the eyes.  Trouble sleeping.  Daytime sleepiness.  How is this diagnosed? This condition may be diagnosed based on:  Your medical history.  A physical exam.  Tests to check for related conditions, such as: ? Asthma. ? Pink eye. ? Ear infection. ? Upper respiratory infection.  Tests to find out which allergens trigger your symptoms. These may include skin or blood tests.  How is this treated? There is no cure for this condition, but treatment can help control symptoms. Treatment may include:  Taking medicines that block allergy symptoms, such as antihistamines. Medicine may be given as a shot, nasal spray, or  pill.  Avoiding the allergen.  Desensitization. This treatment involves getting ongoing shots until your body becomes less sensitive to the allergen. This treatment may be done if other treatments do not help.  If taking medicine and avoiding the allergen does not work, new, stronger medicines may be prescribed.  Follow these instructions at home:  Find out what you are allergic to. Common allergens include smoke, dust, and pollen.  Avoid the things you are allergic to. These are some things you can do to help avoid allergens: ? Replace carpet with wood, tile, or vinyl flooring. Carpet can trap dander and dust. ? Do not smoke. Do not allow smoking in your home. ? Change your heating and air conditioning filter at least once a month. ? During allergy season:  Keep windows closed as much as possible.  Plan outdoor activities when pollen counts are lowest. This is usually during the evening hours.  When coming indoors, change clothing and shower before sitting on furniture or bedding.  Take over-the-counter and prescription medicines only as told by your health care provider.  Keep all follow-up visits as told by your health care provider. This is important. Contact a health care provider if:  You have a fever.  You develop a persistent cough.  You make whistling sounds when you breathe (you wheeze).  Your symptoms interfere with your normal daily activities. Get help right away if:  You have shortness of breath. Summary  This condition can be managed by taking medicines as directed and avoiding allergens.  Contact your health care provider if you develop a persistent cough or fever.  During allergy season, keep windows closed as much as possible. This information is not intended to replace advice given to you by your health care provider. Make sure you discuss any questions you have with your health care provider. Document Released: 05/04/2001 Document Revised: 09/16/2016  Document Reviewed: 09/16/2016 Elsevier Interactive Patient Education  [employer].

## 2018-07-25 NOTE — Assessment & Plan Note (Signed)
Is symptoms started after being outside and consist of excessive sneezing and a runny nose Denies any other symptoms Symptoms have improved today Likely allergic rhinitis No evidence of infection and no need for an antibiotic Zyrtec or Claritin daily Flonase nasal spray daily as needed

## 2018-07-25 NOTE — Assessment & Plan Note (Signed)
Has cold sores 3-4 times a year Has used Denavir cream in the past and it was very effective, but is not covered by insurance Acyclovir does not look like it is covered as well Trial of Valtrex 2000 mg every 12 hours x1 day for outbreak

## 2018-08-11 ENCOUNTER — Other Ambulatory Visit: Payer: Self-pay | Admitting: Cardiovascular Disease

## 2018-10-10 ENCOUNTER — Other Ambulatory Visit (INDEPENDENT_AMBULATORY_CARE_PROVIDER_SITE_OTHER): Payer: Medicare Other

## 2018-10-10 ENCOUNTER — Ambulatory Visit (INDEPENDENT_AMBULATORY_CARE_PROVIDER_SITE_OTHER)
Admission: RE | Admit: 2018-10-10 | Discharge: 2018-10-10 | Disposition: A | Discharge status: Home / Self Care | Payer: Medicare Other | Source: Ambulatory Visit | Attending: Internal Medicine | Admitting: Internal Medicine

## 2018-10-10 ENCOUNTER — Encounter: Payer: Self-pay | Admitting: Internal Medicine

## 2018-10-10 ENCOUNTER — Ambulatory Visit (INDEPENDENT_AMBULATORY_CARE_PROVIDER_SITE_OTHER): Payer: Medicare Other | Admitting: Internal Medicine

## 2018-10-10 VITALS — BP 106/64 | HR 82 | Temp 98.8°F | Ht 68.0 in | Wt 214.0 lb

## 2018-10-10 DIAGNOSIS — R197 Diarrhea, unspecified: Secondary | ICD-10-CM

## 2018-10-10 DIAGNOSIS — R05 Cough: Secondary | ICD-10-CM | POA: Diagnosis not present

## 2018-10-10 DIAGNOSIS — R059 Cough, unspecified: Secondary | ICD-10-CM | POA: Insufficient documentation

## 2018-10-10 DIAGNOSIS — R7303 Prediabetes: Secondary | ICD-10-CM

## 2018-10-10 LAB — URINALYSIS, ROUTINE W REFLEX MICROSCOPIC
Bilirubin Urine: NEGATIVE
Hgb urine dipstick: NEGATIVE
Leukocytes,Ua: NEGATIVE
Nitrite: NEGATIVE
Specific Gravity, Urine: 1.02 (ref 1.000–1.030)
Total Protein, Urine: NEGATIVE
Urine Glucose: NEGATIVE
Urobilinogen, UA: 1 (ref 0.0–1.0)
pH: 5.5 (ref 5.0–8.0)

## 2018-10-10 LAB — HEPATIC FUNCTION PANEL
ALT: 19 U/L (ref 0–53)
AST: 17 U/L (ref 0–37)
Albumin: 4 g/dL (ref 3.5–5.2)
Alkaline Phosphatase: 61 U/L (ref 39–117)
Bilirubin, Direct: 0.2 mg/dL (ref 0.0–0.3)
Total Bilirubin: 1.2 mg/dL (ref 0.2–1.2)
Total Protein: 6.9 g/dL (ref 6.0–8.3)

## 2018-10-10 LAB — CBC WITH DIFFERENTIAL/PLATELET
Basophils Absolute: 0 10*3/uL (ref 0.0–0.1)
Basophils Relative: 0.5 % (ref 0.0–3.0)
Eosinophils Absolute: 0.1 10*3/uL (ref 0.0–0.7)
Eosinophils Relative: 2.1 % (ref 0.0–5.0)
HCT: 47.3 % (ref 39.0–52.0)
Hemoglobin: 15.8 g/dL (ref 13.0–17.0)
Lymphocytes Relative: 18.3 % (ref 12.0–46.0)
Lymphs Abs: 1.2 10*3/uL (ref 0.7–4.0)
MCHC: 33.5 g/dL (ref 30.0–36.0)
MCV: 97.2 fl (ref 78.0–100.0)
Monocytes Absolute: 1 10*3/uL (ref 0.1–1.0)
Monocytes Relative: 15.1 % — ABNORMAL HIGH (ref 3.0–12.0)
Neutro Abs: 4.2 10*3/uL (ref 1.4–7.7)
Neutrophils Relative %: 64 % (ref 43.0–77.0)
Platelets: 194 10*3/uL (ref 150.0–400.0)
RBC: 4.87 Mil/uL (ref 4.22–5.81)
RDW: 14 % (ref 11.5–15.5)
WBC: 6.6 10*3/uL (ref 4.0–10.5)

## 2018-10-10 LAB — BASIC METABOLIC PANEL
BUN: 14 mg/dL (ref 6–23)
CO2: 24 mEq/L (ref 19–32)
Calcium: 9 mg/dL (ref 8.4–10.5)
Chloride: 101 mEq/L (ref 96–112)
Creatinine, Ser: 0.82 mg/dL (ref 0.40–1.50)
GFR: 90.29 mL/min (ref >60.00)
Glucose, Bld: 90 mg/dL (ref 70–99)
Potassium: 3.8 mEq/L (ref 3.5–5.1)
Sodium: 135 mEq/L (ref 135–145)

## 2018-10-10 LAB — LIPASE: Lipase: 18 U/L (ref 11.0–59.0)

## 2018-10-10 MED ORDER — DIPHENOXYLATE-ATROPINE 2.5-0.025 MG PO TABS: 1.0000 | ORAL_TABLET | Freq: Four times a day (QID) | ORAL | 0 refills | Status: DC | PRN

## 2018-10-10 MED ORDER — HYDROCODONE-HOMATROPINE 5-1.5 MG/5ML PO SYRP: 5.0000 mL | ORAL_SOLUTION | Freq: Four times a day (QID) | ORAL | 0 refills | Status: DC | PRN

## 2018-10-10 NOTE — Patient Instructions (Addendum)
Please take all new medication as prescribed - the lomotil if needed for diarrhea (though you might try the OTC immodium as well if needed), as well as Delsym OTC for cough (or the prescription medication if needed)  Please continue all other medications as before, and refills have been done if requested.  Please have the pharmacy call with any other refills you may need.  Please keep your appointments with your specialists as you may have planned  Please go to the XRAY Department in the Basement (go straight as you get off the elevator) for the x-ray testing  Please go to the LAB in the Basement (turn left off the elevator) for the tests to be done today  You will be contacted by phone if any changes need to be made immediately.  Otherwise, you will receive a letter about your results with an explanation, but please check with MyChart first.  Please remember to sign up for MyChart if you have not done so, as this will be important to you in the future with finding out test results, communicating by private email, and scheduling acute appointments online when needed.

## 2018-10-10 NOTE — Assessment & Plan Note (Signed)
likely related to acute illness, for cxr, cough med prn,   to f/u any worsening symptoms or concerns

## 2018-10-10 NOTE — Progress Notes (Signed)
Subjective:    Patient ID: Jason Compton, male    DOB: 1936-02-11, 83 y.o.   MRN: [id number]  HPI  Here after just back from [place], flew [place] to [place], then [place], [place], then on to [place] in [place], then [place], then flew back through [place], then [place] airport then [employer].  Had thermal scanning every day by the ship workers, and then airports.  No other passengers on ship or planes without obvious exposure.  Now with 3 days onset (a day before the wife) of bloating, carmpy abd pain, diarrhea with watery stools, without n/v, blood or high fevers.  Since yesterday also with dry hacking cough, makes the abd pain worse to cough  Has hx of PNA with piror to [place], but Pt denies chest pain, increased sob or doe, wheezing, orthopnea, PND, increased LE swelling, palpitations, dizziness or syncope.  Past Medical History:  Diagnosis Date  . Exogenous obesity   . GERD (gastroesophageal reflux disease)   . History of hiatal hernia   . Hyperlipidemia   . Hypertension   . PVC's (premature ventricular contractions)   . Skin cancer    Past Surgical History:  Procedure Laterality Date  . CARDIOVASCULAR STRESS TEST  04/18/2009   NORMAL  . CATARACT EXTRACTION      reports that he quit smoking about 12 years ago. He has never used smokeless tobacco. He reports current alcohol use. He reports that he does not use drugs. family history includes Cancer in his father; Hypertension in his brother; Osteoporosis in his mother. Allergies  Allergen Reactions  . Altace [Ramipril]     Doesn't recall reaction   Current Outpatient Medications on File Prior to Visit  Medication Sig Dispense Refill  . amLODipine (NORVASC) 5 MG tablet TAKE 1 TABLET BY MOUTH EVERY DAY 90 tablet 3  . aspirin 81 MG tablet Take 81 mg by mouth daily.      Marland Kitchen atorvastatin (LIPITOR) 40 MG tablet TAKE 1 TABLET (40 MG TOTAL) BY MOUTH DAILY. FOLLOW UP NEEDED 30 tablet 6  . cholecalciferol (VITAMIN D) 1000  UNITS tablet Take 1,000 Units by mouth daily.     . Multiple Vitamin (MULTIVITAMIN) tablet Take 1 tablet by mouth daily.      Marland Kitchen omeprazole (PRILOSEC) 20 MG capsule Take 20 mg by mouth daily.     Marland Kitchen penciclovir (DENAVIR) 1 % cream Apply 1 application topically every 2 (two) hours. (Patient taking differently: Apply 1 application topically as needed. ) 5 g 3  . vitamin C (ASCORBIC ACID) 500 MG tablet Take 500 mg by mouth daily.       No current facility-administered medications on file prior to visit.    Review of Systems  Constitutional: Negative for other unusual diaphoresis or sweats HENT: Negative for ear discharge or swelling Eyes: Negative for other worsening visual disturbances Respiratory: Negative for stridor or other swelling  Gastrointestinal: Negative for worsening distension or other blood Genitourinary: Negative for retention or other urinary change Musculoskeletal: Negative for other MSK pain or swelling Skin: Negative for color change or other new lesions Neurological: Negative for worsening tremors and other numbness  Psychiatric/Behavioral: Negative for worsening agitation or other fatigue All other system neg per pt    Objective:   Physical Exam BP 106/64   Pulse 82   Temp 98.8 F (37.1 C) (Oral)   Ht 5\' 8"  (1.727 m)   Wt 214 lb (97.1 kg)   SpO2 97%   BMI 32.54 kg/m  VS noted, mild ill Constitutional: Pt appears in NAD HENT: Head: NCAT.  Right Ear: External ear normal.  Left Ear: External ear normal.  Eyes: . Pupils are equal, round, and reactive to light. Conjunctivae and EOM are normal Bilat tm's with mild erythema.  Max sinus areas non tender.  Pharynx with mild erythema, no exudate Nose: without d/c or deformity Neck: Neck supple. Gross normal ROM Cardiovascular: Normal rate and regular rhythm.   Pulmonary/Chest: Effort normal and breath sounds without rales or wheezing.  Abd:  Soft, NT, ND, + BS, no organomegaly - benign Neurological: Pt is alert. At  baseline orientation, motor grossly intact Skin: Skin is warm. No rashes, other new lesions, no LE edema Psychiatric: Pt behavior is normal without agitation  No other exam findings     Assessment & Plan:

## 2018-10-10 NOTE — Assessment & Plan Note (Signed)
stable overall by history and exam, recent data reviewed with pt, and pt to continue medical treatment as before,  to f/u any worsening symptoms or concerns  

## 2018-10-10 NOTE — Assessment & Plan Note (Signed)
Etiology unclear, non bloody acute, for labs testing and GI panel, immodium or lomotil prn,  to f/u any worsening symptoms or concerns

## 2018-10-11 ENCOUNTER — Telehealth: Payer: Self-pay | Admitting: Cardiovascular Disease

## 2018-10-11 NOTE — Telephone Encounter (Signed)
Patient needs his yearly appointment. Was seen in February 2019. Was told to return in 1 year. Medicare will not cover supplies without this face to face office note. Please have the schedulers to the call patient and schedule appointment to follow up with Dr Claiborne Billings.

## 2018-10-11 NOTE — Telephone Encounter (Signed)
Please have patient scheduled. Thank you!

## 2018-10-11 NOTE — Telephone Encounter (Signed)
Please advise. Thank you

## 2018-10-11 NOTE — Telephone Encounter (Signed)
New Message   PT is calling and needs Dr Claiborne Billings to send a notice to [facility] for his cpap machine. For PT to continue getting the things he needs, a certification is needed for medicare to continue to pay for the supplies.

## 2018-10-12 ENCOUNTER — Other Ambulatory Visit: Payer: Self-pay | Admitting: Urology

## 2018-10-12 DIAGNOSIS — C61 Malignant neoplasm of prostate: Secondary | ICD-10-CM

## 2018-10-16 ENCOUNTER — Ambulatory Visit (INDEPENDENT_AMBULATORY_CARE_PROVIDER_SITE_OTHER): Payer: Medicare Other | Admitting: Internal Medicine

## 2018-10-16 ENCOUNTER — Other Ambulatory Visit: Payer: Self-pay

## 2018-10-16 ENCOUNTER — Other Ambulatory Visit: Payer: Medicare Other

## 2018-10-16 ENCOUNTER — Encounter: Payer: Self-pay | Admitting: Internal Medicine

## 2018-10-16 VITALS — BP 142/72 | HR 74 | Temp 98.5°F | Resp 16 | Ht 68.0 in | Wt 212.0 lb

## 2018-10-16 DIAGNOSIS — R197 Diarrhea, unspecified: Secondary | ICD-10-CM | POA: Diagnosis not present

## 2018-10-16 DIAGNOSIS — R062 Wheezing: Secondary | ICD-10-CM | POA: Insufficient documentation

## 2018-10-16 DIAGNOSIS — R059 Cough, unspecified: Secondary | ICD-10-CM

## 2018-10-16 DIAGNOSIS — R05 Cough: Secondary | ICD-10-CM

## 2018-10-16 MED ORDER — HYDROCODONE-HOMATROPINE 5-1.5 MG/5ML PO SYRP: 5.0000 mL | ORAL_SOLUTION | Freq: Four times a day (QID) | ORAL | 0 refills | Status: DC | PRN

## 2018-10-16 MED ORDER — AZITHROMYCIN 250 MG PO TABS: ORAL_TABLET | ORAL | 0 refills | Status: DC

## 2018-10-16 NOTE — Assessment & Plan Note (Signed)
Cough has gotten worse - occasionally bringing up sputum Has wheezing on exam Will start a zpak Continue cough syrup Call if no improvement

## 2018-10-16 NOTE — Progress Notes (Signed)
Subjective:    Patient ID: Jason Compton, male    DOB: 26-Nov-1935, 83 y.o.   MRN: [id number]  HPI The patient is here for an acute visit.  He was seen last week by Dr Jenny Reichmann for cold symptoms after returning from [place].  He was having diarrhea and a dry cough.  The diarrhea has resolved.  He did give a stool sample.  His cough has gotten worse and he is bringing up some sputum at times.  It is much worse during the day.  He is having some wheezing.  He denies SOB and fever.  He has a runny nose.  The Cough syrup helped a ltitle.     Medications and allergies reviewed with patient and updated if appropriate.  Patient Active Problem List   Diagnosis Date Noted  . Diarrhea 10/10/2018  . Cough 10/10/2018  . Herpes simplex 07/25/2018  . Allergic rhinitis 07/25/2018  . OSA on CPAP 05/08/2018  . Cervical spondylosis without myelopathy 10/13/2017  . Aortic atherosclerosis ([place]) 04/28/2017  . Coronary artery disease 04/28/2017  . Prediabetes 11/02/2016  . Hard of hearing 07/30/2013  . Hypercholesterolemia 03/08/2011  . PSA elevation 03/08/2011    Current Outpatient Medications on File Prior to Visit  Medication Sig Dispense Refill  . amLODipine (NORVASC) 5 MG tablet TAKE 1 TABLET BY MOUTH EVERY DAY 90 tablet 3  . aspirin 81 MG tablet Take 81 mg by mouth daily.      Marland Kitchen atorvastatin (LIPITOR) 40 MG tablet TAKE 1 TABLET (40 MG TOTAL) BY MOUTH DAILY. FOLLOW UP NEEDED 30 tablet 6  . cholecalciferol (VITAMIN D) 1000 UNITS tablet Take 1,000 Units by mouth daily.     . diphenoxylate-atropine (LOMOTIL) 2.5-0.025 MG tablet Take 1 tablet by mouth 4 (four) times daily as needed for diarrhea or loose stools. 30 tablet 0  . Multiple Vitamin (MULTIVITAMIN) tablet Take 1 tablet by mouth daily.      Marland Kitchen omeprazole (PRILOSEC) 20 MG capsule Take 20 mg by mouth daily.     Marland Kitchen penciclovir (DENAVIR) 1 % cream Apply 1 application topically every 2 (two) hours. (Patient taking differently: Apply 1 application  topically as needed. ) 5 g 3  . vitamin C (ASCORBIC ACID) 500 MG tablet Take 500 mg by mouth daily.       No current facility-administered medications on file prior to visit.     Past Medical History:  Diagnosis Date  . Exogenous obesity   . GERD (gastroesophageal reflux disease)   . History of hiatal hernia   . Hyperlipidemia   . Hypertension   . PVC's (premature ventricular contractions)   . Skin cancer     Past Surgical History:  Procedure Laterality Date  . CARDIOVASCULAR STRESS TEST  04/18/2009   NORMAL  . CATARACT EXTRACTION      Social History   Socioeconomic History  . Marital status: Married    Spouse name: Not on file  . Number of children: Not on file  . Years of education: Not on file  . Highest education level: Not on file  Occupational History  . Not on file  Social Needs  . Financial resource strain: Not on file  . Food insecurity:    Worry: Not on file    Inability: Not on file  . Transportation needs:    Medical: Not on file    Non-medical: Not on file  Tobacco Use  . Smoking status: Former Smoker    Last attempt to  quit: 08/23/2006    Years since quitting: 12.1  . Smokeless tobacco: Never Used  Substance and Sexual Activity  . Alcohol use: Yes    Alcohol/week: 0.0 standard drinks    Comment: wine  . Drug use: No  . Sexual activity: Not on file  Lifestyle  . Physical activity:    Days per week: Not on file    Minutes per session: Not on file  . Stress: Not on file  Relationships  . Social connections:    Talks on phone: Not on file    Gets together: Not on file    Attends religious service: Not on file    Active member of club or organization: Not on file    Attends meetings of clubs or organizations: Not on file    Relationship status: Not on file  Other Topics Concern  . Not on file  Social History Narrative  . Not on file    Family History  Problem Relation Age of Onset  . Osteoporosis Mother   . Cancer Father        skin    . Hypertension Brother     Review of Systems  Constitutional: Negative for chills and fever.  HENT: Positive for rhinorrhea (in last day). Negative for congestion, ear pain and sore throat.   Respiratory: Positive for cough (dry, very little sputum at times) and wheezing (little). Negative for chest tightness and shortness of breath.   Cardiovascular: Negative for chest pain.  Gastrointestinal: Negative for diarrhea (resolved after two days) and nausea.  Musculoskeletal: Negative for myalgias.  Neurological: Negative for light-headedness and headaches.       Objective:   Vitals:   10/16/18 1456  BP: (!) 142/72  Pulse: 74  Resp: 16  Temp: 98.5 F (36.9 C)  SpO2: 96%   BP Readings from Last 3 Encounters:  10/16/18 (!) 142/72  10/10/18 106/64  07/25/18 (!) 144/72   Wt Readings from Last 3 Encounters:  10/16/18 212 lb (96.2 kg)  10/10/18 214 lb (97.1 kg)  07/25/18 214 lb (97.1 kg)   Body mass index is 32.23 kg/m.   Physical Exam    GENERAL APPEARANCE: Appears stated age, well appearing, NAD EYES: conjunctiva clear, no icterus HEENT: bilateral tympanic membranes and ear canals normal, oropharynx with no erythema, no thyromegaly, trachea midline, no cervical or supraclavicular lymphadenopathy LUNGS: unlabored breathing, good air entry bilaterally, mild expiratory wheeze, no crackles CARDIOVASCULAR: Normal S1,S2 without murmurs, no edema SKIN: Warm, dry      Assessment & Plan:    See Problem List for Assessment and Plan of chronic medical problems.

## 2018-10-16 NOTE — Patient Instructions (Addendum)
Take the antibiotic as prescribed.   Take the cough syrup as needed.     Call if no improvement

## 2018-10-17 LAB — GASTROINTESTINAL PATHOGEN PANEL PCR
C. difficile Tox A/B, PCR: NOT DETECTED
Campylobacter, PCR: NOT DETECTED
Cryptosporidium, PCR: NOT DETECTED
E coli (ETEC) LT/ST PCR: NOT DETECTED
E coli (STEC) stx1/stx2, PCR: NOT DETECTED
E coli 0157, PCR: NOT DETECTED
Giardia lamblia, PCR: NOT DETECTED
Norovirus, PCR: NOT DETECTED
Rotavirus A, PCR: NOT DETECTED
Salmonella, PCR: NOT DETECTED
Shigella, PCR: NOT DETECTED

## 2018-10-24 ENCOUNTER — Encounter: Payer: Self-pay | Admitting: Cardiovascular Disease

## 2018-10-24 ENCOUNTER — Ambulatory Visit (INDEPENDENT_AMBULATORY_CARE_PROVIDER_SITE_OTHER): Payer: Medicare Other | Admitting: Cardiovascular Disease

## 2018-10-24 VITALS — BP 132/74 | HR 65 | Ht 68.0 in | Wt 211.8 lb

## 2018-10-24 DIAGNOSIS — G4733 Obstructive sleep apnea (adult) (pediatric): Secondary | ICD-10-CM | POA: Diagnosis not present

## 2018-10-24 DIAGNOSIS — K219 Gastro-esophageal reflux disease without esophagitis: Secondary | ICD-10-CM

## 2018-10-24 DIAGNOSIS — I10 Essential (primary) hypertension: Secondary | ICD-10-CM | POA: Diagnosis not present

## 2018-10-24 DIAGNOSIS — E7800 Pure hypercholesterolemia, unspecified: Secondary | ICD-10-CM

## 2018-10-24 NOTE — Patient Instructions (Signed)
Medication Instructions:  The current medical regimen is effective;  continue present plan and medications.  If you need a refill on your cardiac medications before your next appointment, please call your pharmacy.     Follow-Up: At [facility], you and your health needs are our priority.  As part of our continuing mission to provide you with exceptional heart care, we have created designated Provider Care Teams.  These Care Teams include your primary Cardiologist (physician) and Advanced Practice Providers (APPs -  Physician Assistants and Nurse Practitioners) who all work together to provide you with the care you need, when you need it. You will need a follow up appointment in 12 months (SLEEP).  Please call our office 2 months in advance to schedule this appointment.  You may see Dr.Kelly or one of the following Advanced Practice Providers on your designated Care Team: Almyra Deforest, [place] . Fabian Sharp, PA-C

## 2018-10-24 NOTE — Progress Notes (Signed)
Cardiology Office Note    Date:  10/24/2018   ID:  Jason Compton, DOB July 04, 1936, MRN [id number]  PCP:  Binnie Rail, MD  Cardiologist:  Shelva Majestic, MD    History of Present Illness:  Jason Compton is a 83 y.o. male who presents to the office today for one-year follow-up sleep evaluation following.   Jason Compton is a former patient of [name], and sees Dr. Oval Linsey for cardiology care.  He has a history of hypertension, hyperlipidemia, and silly at disease.  When seen by Dr. Oval Linsey.  His wife noted that he had very loud snoring and also was aware of witnessed apnea.  He was referred for a sleep study which was done on 06/29/2017.  This revealed severe obstructive sleep apnea with an HI of 60.2.  There was absence of REM sleep on the diagnostic portion of the study.  Was moderate oxygen desaturation to a nadir of 83%.  There was moderate snoring and moderate periodic limb movements of sleep.  He was started on CPAP therapy with a set up date of 08/26/2007 and has a ResMed air.  Sensed 10 AutoSet which initially was set at a minimum pressure of 10 and maximum pressure of 20.  He has been using nasal pillow mask.  A download from January 28 through 10/18/2017 reveals excellent compliance with 93% of days.  Use.  There was only 2 days when he was out of town that he did not use his machine.  He was averaging 7 hours of sleep per night.  AHI was 2.7.  95th percentile pressure was 13.6 with a maximum of 14.8.  I saw him for initial sleep evaluation in February 2019.  Since initiating CPAP, his nocturia has improved from 5 times per night to 0-1 time per night.  His sleep is more restorative.  He is dreaming.  He denies residual daytime sleepiness.  During that evaluation an Epworth Sleepiness Scale score was calculated since initiating CPAP and this endorsed at 8.    Epworth Sleepiness Scale: Situation   Chance of Dozing/Sleeping (0 = never , 1 = slight chance , 2 = moderate  chance , 3 = high chance )   sitting and reading 1   watching TV 2   sitting inactive in a public place 1   being a passenger in a motor vehicle for an hour or more 0   lying down in the afternoon 3   sitting and talking to someone 0   sitting quietly after lunch (no alcohol) 1   while stopped for a few minutes in traffic as the driver 0   Total Score  8   When seen for his initial sleep evaluation his compliance was excellent.  He noted marked improvement in sleep quality.  AHI was 2.7.  He was using a nasal mask.  The past year, he has continued to travel extensively.  He had purchased a portable ResMed air mini CPAP unit to take with his travel.  He uses the full air sense 10 AutoSet unit at home.  He admits to 100% compliance with use with combined treatment modalities.  A download for the month of December showed excellent compliance the days which he did not use his main treatment he was using his portable unit away from home.  AHI was excellent at 0.5.  He was set at a minimum pressure of 10 a maximum of 20 cm with 95th percentile average pressure 12.9 and maximum  average pressure 13.0.  He recently returned from an extensive trip to [place] and [place] for 1 month to his portable unit from mid January through mid February.  He is sleeping well.  He denies residual daytime sleepiness.  A repeat Epworth Sleepiness Scale score was calculated in the office today and this endorsed at 7.  He presents for one-year evaluation.  Past Medical History:  Diagnosis Date  . Exogenous obesity   . GERD (gastroesophageal reflux disease)   . History of hiatal hernia   . Hyperlipidemia   . Hypertension   . PVC's (premature ventricular contractions)   . Skin cancer     Past Surgical History:  Procedure Laterality Date  . CARDIOVASCULAR STRESS TEST  04/18/2009   NORMAL  . CATARACT EXTRACTION      Current Medications: Outpatient Medications Prior to Visit  Medication Sig Dispense  Refill  . amLODipine (NORVASC) 5 MG tablet TAKE 1 TABLET BY MOUTH EVERY DAY 90 tablet 3  . aspirin 81 MG tablet Take 81 mg by mouth daily.      Marland Kitchen atorvastatin (LIPITOR) 40 MG tablet TAKE 1 TABLET (40 MG TOTAL) BY MOUTH DAILY. FOLLOW UP NEEDED 30 tablet 6  . azithromycin (ZITHROMAX) 250 MG tablet Take two tabs the first day and then one tab daily for four days 6 tablet 0  . cholecalciferol (VITAMIN D) 1000 UNITS tablet Take 1,000 Units by mouth daily.     . diphenoxylate-atropine (LOMOTIL) 2.5-0.025 MG tablet Take 1 tablet by mouth 4 (four) times daily as needed for diarrhea or loose stools. 30 tablet 0  . HYDROcodone-homatropine (HYCODAN) 5-1.5 MG/5ML syrup Take 5 mLs by mouth every 6 (six) hours as needed for up to 10 days for cough. 120 mL 0  . Multiple Vitamin (MULTIVITAMIN) tablet Take 1 tablet by mouth daily.      Marland Kitchen omeprazole (PRILOSEC) 20 MG capsule Take 20 mg by mouth daily.     Marland Kitchen penciclovir (DENAVIR) 1 % cream Apply 1 application topically every 2 (two) hours. (Patient taking differently: Apply 1 application topically as needed. ) 5 g 3  . vitamin C (ASCORBIC ACID) 500 MG tablet Take 500 mg by mouth daily.       No facility-administered medications prior to visit.      Allergies:   Altace [ramipril]   Social History   Socioeconomic History  . Marital status: Married    Spouse name: Not on file  . Number of children: Not on file  . Years of education: Not on file  . Highest education level: Not on file  Occupational History  . Not on file  Social Needs  . Financial resource strain: Not on file  . Food insecurity:    Worry: Not on file    Inability: Not on file  . Transportation needs:    Medical: Not on file    Non-medical: Not on file  Tobacco Use  . Smoking status: Former Smoker    Last attempt to quit: 08/23/2006    Years since quitting: 12.1  . Smokeless tobacco: Never Used  Substance and Sexual Activity  . Alcohol use: Yes    Alcohol/week: 0.0 standard drinks     Comment: wine  . Drug use: No  . Sexual activity: Not on file  Lifestyle  . Physical activity:    Days per week: Not on file    Minutes per session: Not on file  . Stress: Not on file  Relationships  . Social  connections:    Talks on phone: Not on file    Gets together: Not on file    Attends religious service: Not on file    Active member of club or organization: Not on file    Attends meetings of clubs or organizations: Not on file    Relationship status: Not on file  Other Topics Concern  . Not on file  Social History Narrative  . 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  He is an Engineer, production background.  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  Family History:  The patient's family history includes Cancer in his father; Hypertension in his brother; Osteoporosis in his mother.   ROS General: Negative; No fevers, chills, or night sweats;  HEENT: Negative; No changes in vision or hearing, sinus congestion, difficulty swallowing Pulmonary: Negative; No cough, wheezing, shortness of breath, hemoptysis Cardiovascular: Positive for hypertension and hyperlipidemia No chest pain, presyncope, syncope, palpitations GI: Negative; No nausea, vomiting, diarrhea, or abdominal pain GU: Negative; No dysuria, hematuria, or difficulty voiding Musculoskeletal: Negative; no myalgias, joint pain, or weakness Hematologic/Oncology: Negative; no easy bruising, bleeding Endocrine: Negative; no heat/cold intolerance; no diabetes Neuro: Negative; no changes in balance, headaches Skin: Negative; No rashes or skin lesions Psychiatric: Negative; No behavioral problems, depression Sleep: Positive for OSA, severe.  Since initiating CPAP therapy.  He is unaware of breakthrough  snoring, daytime sleepiness, hypersomnolence, bruxism, restless legs, hypnogognic hallucinations, no  cataplexy Other comprehensive 14 point system review is negative.  PHYSICAL EXAM:   VS:  BP 132/74   Pulse 65   Ht '5\' 8"'  (1.727 m)   Wt 211 lb 12.8 oz (96.1 kg)   SpO2 98%   BMI 32.20 kg/m     Repeat BP was 130/70  Wt Readings from Last 3 Encounters:  10/24/18 211 lb 12.8 oz (96.1 kg)  10/16/18 212 lb (96.2 kg)  10/10/18 214 lb (97.1 kg)    General: Alert, oriented, no distress.  Skin: normal turgor, no rashes, warm and dry HEENT: Normocephalic, atraumatic. Pupils equal round and reactive to light; sclera anicteric; extraocular muscles intact; Fundi ** Nose without nasal septal hypertrophy Mouth/Parynx benign; Mallinpatti scale 3 Neck: No JVD, no carotid bruits; normal carotid upstroke Lungs: clear to ausculatation and percussion; no wheezing or rales Chest wall: without tenderness to palpitation Heart: PMI not displaced, RRR, s1 s2 normal, 1/6 systolic murmur, no diastolic murmur, no rubs, gallops, thrills, or heaves Abdomen: soft, nontender; no hepatosplenomehaly, BS+; abdominal aorta nontender and not dilated by palpation. Back: no CVA tenderness Pulses 2+ Musculoskeletal: full range of motion, normal strength, no joint deformities Extremities: no clubbing cyanosis or edema, Homan's sign negative  Neurologic: grossly nonfocal; Cranial nerves grossly wnl Psychologic: Normal mood and affect   Studies/Labs Reviewed:   EKG:  EKG is  ordered today.  ECG (independently read by me): Sinus rhythm at 65 bpm.  Left axis deviation.  Normal intervals.  February 2019 ECG (independently read by me): Normal sinus rhythm at 70 bpm.  Q waves in 3 and aVF.  Normal intervals.  Recent Labs: BMP Latest Ref Rng & Units 10/10/2018 05/09/2018 11/09/2017  Glucose 70 - 99 mg/dL 90 116(H) 101(H)  BUN 6 - 23 mg/dL '14 14 15  ' Creatinine 0.40 - 1.50 mg/dL 0.82 0.80 1.00  BUN/Creat Ratio 10 - 24 - - -  Sodium 135 - 145 mEq/L 135  139 140  Potassium 3.5 - 5.1 mEq/L 3.8 4.8 4.6  Chloride 96 - 112  mEq/L 101 103 105  CO2 19 - 32 mEq/L '24 26 25  ' Calcium 8.4 - 10.5 mg/dL 9.0 9.2 9.3     Hepatic Function Latest Ref Rng & Units 10/10/2018 05/09/2018 11/09/2017  Total Protein 6.0 - 8.3 g/dL 6.9 6.9 6.7  Albumin 3.5 - 5.2 g/dL 4.0 4.1 4.3  AST 0 - 37 U/L '17 20 21  ' ALT 0 - 53 U/L '19 21 22  ' Alk Phosphatase 39 - 117 U/L 61 61 64  Total Bilirubin 0.2 - 1.2 mg/dL 1.2 1.1 0.5  Bilirubin, Direct 0.0 - 0.3 mg/dL 0.2 - -    CBC Latest Ref Rng & Units 10/10/2018 11/09/2017 11/02/2016  WBC 4.0 - 10.5 K/uL 6.6 4.5 5.8  Hemoglobin 13.0 - 17.0 g/dL 15.8 15.6 15.5  Hematocrit 39.0 - 52.0 % 47.3 46.3 46.7  Platelets 150.0 - 400.0 K/uL 194.0 211.0 275.0   Lab Results  Component Value Date   MCV 97.2 10/10/2018   MCV 98.5 11/09/2017   MCV 95.5 11/02/2016   Lab Results  Component Value Date   TSH 1.12 11/09/2017   Lab Results  Component Value Date   HGBA1C 5.9 05/09/2018     BNP No results found for: BNP  ProBNP No results found for: PROBNP   Lipid Panel     Component Value Date/Time   CHOL 166 05/09/2018 0916   CHOL 165 05/05/2017 0832   TRIG 63.0 05/09/2018 0916   HDL 64.60 05/09/2018 0916   HDL 68 05/05/2017 0832   CHOLHDL 3 05/09/2018 0916   VLDL 12.6 05/09/2018 0916   LDLCALC 89 05/09/2018 0916   LDLCALC 86 05/05/2017 0832   LDLDIRECT 189.7 12/10/2011 0901    RADIOLOGY: Dg Chest 2 View  Result Date: 10/11/2018 CLINICAL DATA:  Pt traveling to [place], just came back yesterday, c/o dry cough, fatigue, since, no other chest complains, x-smoker. EXAM: CHEST - 2 VIEW COMPARISON:  11/16/2016 FINDINGS: The heart size is normal. Lungs are free of focal consolidations and pleural effusions. No pulmonary edema. IMPRESSION: No active cardiopulmonary disease. Electronically Signed   By: Nolon Nations M.D.   On: 10/11/2018 07:48     Additional studies/ records that were reviewed today include:  I reviewed the records of Dr. Oval Linsey.  I reviewed his sleep study and  generated several  downloads on CPAP therapy.  ASSESSMENT:    1. OSA (obstructive sleep apnea)   2. Essential hypertension   3. Pure hypercholesterolemia   4. Gastroesophageal reflux disease without esophagitis     PLAN:  Jason Compton is a very pleasant 83 year-old gentleman who is retired and since retirement fe and his wife have traveled extensively.  He has a history of hypertension and hyperlipidemia and celiac disease.  He was diagnosed with severe obstructive sleep apnea previous symptoms included snoring, witnessed apnea, nocturia 4-5 times per night as well as daytime sleepiness.  Has been on CPAP therapy since August 25, 2017.  He continues to be compliant.  He has purchased a portable bed air mini unit to take with travel.  Over the past year he has not snapped slept without CPAP.  Most recent download demonstrates excellent fit with an AHI of 0.5/h and is 95th percentile pressure is 12.9 with a maximum average pressure of 13.9.  At times he does have a mild leak.  He uses nasal cushions and he seems to tolerate this  well.  I discussed with him other options eluding the Respironics DreamWear in the ResMed air fit N 41i's old mask which potentially can be better alternatives.  He is doing well with his current therapy and at present content with continuing with this.  He now wakes up at most 1 time per night for urination which is significantly improved from previous 5 times per night.  I again discussed the physiology associated with current nocturia and untreated sleep apnea.  His blood pressure today is upper normal on amlodipine 5 mg.    He believes his blood pressure has dropped since initiating therapy.  He is on atorvastatin for hyperlipidemia and tolerating this well.  His GERD is improved with CPAP and he continues to be on pantoprazole.  He denies any nocturnal palpitations.  He will follow-up with Dr. Oval Linsey for cardiology care.  I will see him in 1 year per Medicare  requirements for follow-up sleep evaluation.   Time spent: 25 miniutes  Medication Adjustments/Labs and Tests Ordered: Current medicines are reviewed at length with the patient today.  Concerns regarding medicines are outlined above.  Medication changes, Labs and Tests ordered today are listed in the Patient Instructions below. Patient Instructions  Medication Instructions:  The current medical regimen is effective;  continue present plan and medications.  If you need a refill on your cardiac medications before your next appointment, please call your pharmacy.     Follow-Up: At [facility], you and your health needs are our priority.  As part of our continuing mission to provide you with exceptional heart care, we have created designated Provider Care Teams.  These Care Teams include your primary Cardiologist (physician) and Advanced Practice Providers (APPs -  Physician Assistants and Nurse Practitioners) who all work together to provide you with the care you need, when you need it. You will need a follow up appointment in 12 months (SLEEP).  Please call our office 2 months in advance to schedule this appointment.  You may see Dr.Aariona Momon or one of the following Advanced Practice Providers on your designated Care Team: Almyra Deforest, [place] . Fabian Sharp, PA-C        Signed, Shelva Majestic, MD  10/24/2018 12:39 PM    [facility] [address], [place], [place], [place]  [address] Phone: [phone]

## 2018-10-26 ENCOUNTER — Telehealth: Payer: Self-pay | Admitting: *Deleted

## 2018-10-26 NOTE — Telephone Encounter (Signed)
Called patient to schedule AWV, patient declined.  

## 2018-10-31 ENCOUNTER — Telehealth: Payer: Self-pay | Admitting: Internal Medicine

## 2018-10-31 DIAGNOSIS — H9193 Unspecified hearing loss, bilateral: Secondary | ICD-10-CM

## 2018-10-31 NOTE — Telephone Encounter (Signed)
ordered

## 2018-10-31 NOTE — Telephone Encounter (Signed)
Copied from [place] [phone]. Topic: Referral - Request for Referral >> Oct 31, 2018 10:46 AM Rayann Heman wrote: Has patient seen PCP for this complaint? yes Referral for which specialty:Audiologic evaluation for medicare  Preferred provider[facility]office:  [facility] on [address] in [place] Reason for referral:hearing seems to be declining.

## 2018-10-31 NOTE — Telephone Encounter (Signed)
Pt aware.

## 2018-11-02 DIAGNOSIS — M79672 Pain in left foot: Secondary | ICD-10-CM | POA: Diagnosis not present

## 2018-11-02 DIAGNOSIS — M722 Plantar fascial fibromatosis: Secondary | ICD-10-CM | POA: Diagnosis not present

## 2018-11-05 NOTE — Progress Notes (Signed)
Subjective:    Patient ID: Jason Compton, male    DOB: Apr 21, 1936, 83 y.o.   MRN: [id number]  HPI The patient is here for follow up.  Prediabetes:  He is compliant with a low sugar/carbohydrate diet.  He is not exercising regularly.  CAD: He is taking his medication daily. He is compliant with a low sodium diet.  He denies chest pain, palpitations, edema, shortness of breath and regular headaches. He is not exercising regularly.  He does not monitor his blood pressure at home.    Hyperlipidemia: He is taking his medication daily. He is compliant with a low fat/cholesterol diet. He is not exercising regularly. He denies myalgias.    Medications and allergies reviewed with patient and updated if appropriate.  Patient Active Problem List   Diagnosis Date Noted  . Wheeze 10/16/2018  . Diarrhea 10/10/2018  . Cough 10/10/2018  . Herpes simplex 07/25/2018  . Allergic rhinitis 07/25/2018  . OSA on CPAP 05/08/2018  . History of adenomatous polyp of colon 02/15/2018  . Internal hemorrhoids 02/15/2018  . Cervical spondylosis without myelopathy 10/13/2017  . Aortic atherosclerosis ([place]) 04/28/2017  . Coronary artery disease 04/28/2017  . Prediabetes 11/02/2016  . Diverticulosis 07/30/2013  . Hard of hearing 07/30/2013  . Hypercholesterolemia 03/08/2011  . PSA elevation 03/08/2011    Current Outpatient Medications on File Prior to Visit  Medication Sig Dispense Refill  . amLODipine (NORVASC) 5 MG tablet TAKE 1 TABLET BY MOUTH EVERY DAY 90 tablet 3  . aspirin 81 MG tablet Take 81 mg by mouth daily.      Marland Kitchen atorvastatin (LIPITOR) 40 MG tablet TAKE 1 TABLET (40 MG TOTAL) BY MOUTH DAILY. FOLLOW UP NEEDED 30 tablet 6  . cholecalciferol (VITAMIN D) 1000 UNITS tablet Take 1,000 Units by mouth daily.     . diphenoxylate-atropine (LOMOTIL) 2.5-0.025 MG tablet Take 1 tablet by mouth 4 (four) times daily as needed for diarrhea or loose stools. 30 tablet 0  . Multiple Vitamin (MULTIVITAMIN)  tablet Take 1 tablet by mouth daily.      Marland Kitchen omeprazole (PRILOSEC) 20 MG capsule Take 20 mg by mouth daily.     . vitamin C (ASCORBIC ACID) 500 MG tablet Take 500 mg by mouth daily.       No current facility-administered medications on file prior to visit.     Past Medical History:  Diagnosis Date  . Exogenous obesity   . GERD (gastroesophageal reflux disease)   . History of hiatal hernia   . Hyperlipidemia   . Hypertension   . PVC's (premature ventricular contractions)   . Skin cancer     Past Surgical History:  Procedure Laterality Date  . CARDIOVASCULAR STRESS TEST  04/18/2009   NORMAL  . CATARACT EXTRACTION      Social History   Socioeconomic History  . Marital status: Married    Spouse name: Not on file  . Number of children: Not on file  . Years of education: Not on file  . Highest education level: Not on file  Occupational History  . Not on file  Social Needs  . Financial resource strain: Not on file  . Food insecurity:    Worry: Not on file    Inability: Not on file  . Transportation needs:    Medical: Not on file    Non-medical: Not on file  Tobacco Use  . Smoking status: Former Smoker    Last attempt to quit: 08/23/2006    Years  since quitting: 12.2  . Smokeless tobacco: Never Used  Substance and Sexual Activity  . Alcohol use: Yes    Alcohol/week: 0.0 standard drinks    Comment: wine  . Drug use: No  . Sexual activity: Not on file  Lifestyle  . Physical activity:    Days per week: Not on file    Minutes per session: Not on file  . Stress: Not on file  Relationships  . Social connections:    Talks on phone: Not on file    Gets together: Not on file    Attends religious service: Not on file    Active member of club or organization: Not on file    Attends meetings of clubs or organizations: Not on file    Relationship status: Not on file  Other Topics Concern  . Not on file  Social History Narrative  . Not on file    Family History   Problem Relation Age of Onset  . Osteoporosis Mother   . Cancer Father        skin  . Hypertension Brother     Review of Systems  Constitutional: Negative for chills and fever.  Respiratory: Positive for cough (occ, dry). Negative for shortness of breath and wheezing.   Cardiovascular: Negative for chest pain, palpitations and leg swelling.  Neurological: Negative for light-headedness and headaches.       Objective:   Vitals:   11/07/18 0835  BP: 138/68  Pulse: 71  Resp: 16  Temp: 98.6 F (37 C)  SpO2: 96%   BP Readings from Last 3 Encounters:  11/07/18 138/68  10/24/18 132/74  10/16/18 (!) 142/72   Wt Readings from Last 3 Encounters:  11/07/18 212 lb (96.2 kg)  10/24/18 211 lb 12.8 oz (96.1 kg)  10/16/18 212 lb (96.2 kg)   Body mass index is 32.23 kg/m.   Physical Exam    Constitutional: Appears well-developed and well-nourished. No distress.  HENT:  Head: Normocephalic and atraumatic.  Neck: Neck supple. No tracheal deviation present. No thyromegaly present.  No cervical lymphadenopathy Cardiovascular: Normal rate, regular rhythm and normal heart sounds.   2/6 systolic murmur heard. No carotid bruit .  No edema Pulmonary/Chest: Effort normal and breath sounds normal. No respiratory distress. No has no wheezes. No rales.  Skin: Skin is warm and dry. Not diaphoretic.  Psychiatric: Normal mood and affect. Behavior is normal.      Assessment & Plan:    See Problem List for Assessment and Plan of chronic medical problems.

## 2018-11-07 ENCOUNTER — Encounter: Payer: Self-pay | Admitting: Internal Medicine

## 2018-11-07 ENCOUNTER — Ambulatory Visit (INDEPENDENT_AMBULATORY_CARE_PROVIDER_SITE_OTHER): Payer: Medicare Other | Admitting: Internal Medicine

## 2018-11-07 ENCOUNTER — Other Ambulatory Visit (INDEPENDENT_AMBULATORY_CARE_PROVIDER_SITE_OTHER): Payer: Medicare Other

## 2018-11-07 ENCOUNTER — Other Ambulatory Visit: Payer: Self-pay

## 2018-11-07 VITALS — BP 138/68 | HR 71 | Temp 98.6°F | Resp 16 | Ht 68.0 in | Wt 212.0 lb

## 2018-11-07 DIAGNOSIS — R972 Elevated prostate specific antigen [PSA]: Secondary | ICD-10-CM

## 2018-11-07 DIAGNOSIS — G4733 Obstructive sleep apnea (adult) (pediatric): Secondary | ICD-10-CM

## 2018-11-07 DIAGNOSIS — I2510 Atherosclerotic heart disease of native coronary artery without angina pectoris: Secondary | ICD-10-CM

## 2018-11-07 DIAGNOSIS — E7800 Pure hypercholesterolemia, unspecified: Secondary | ICD-10-CM | POA: Diagnosis not present

## 2018-11-07 DIAGNOSIS — R7303 Prediabetes: Secondary | ICD-10-CM | POA: Diagnosis not present

## 2018-11-07 DIAGNOSIS — Z9989 Dependence on other enabling machines and devices: Secondary | ICD-10-CM | POA: Diagnosis not present

## 2018-11-07 LAB — LIPID PANEL
Cholesterol: 184 mg/dL (ref 0–200)
HDL: 68.3 mg/dL (ref >39.00)
LDL Cholesterol: 100 mg/dL — ABNORMAL HIGH (ref 0–99)
NonHDL: 115.89
Total CHOL/HDL Ratio: 3
Triglycerides: 78 mg/dL (ref 0.0–149.0)
VLDL: 15.6 mg/dL (ref 0.0–40.0)

## 2018-11-07 LAB — CBC WITH DIFFERENTIAL/PLATELET
Basophils Absolute: 0 10*3/uL (ref 0.0–0.1)
Basophils Relative: 0.7 % (ref 0.0–3.0)
Eosinophils Absolute: 0.2 10*3/uL (ref 0.0–0.7)
Eosinophils Relative: 3.2 % (ref 0.0–5.0)
HCT: 47.2 % (ref 39.0–52.0)
Hemoglobin: 16.1 g/dL (ref 13.0–17.0)
Lymphocytes Relative: 34.4 % (ref 12.0–46.0)
Lymphs Abs: 1.9 10*3/uL (ref 0.7–4.0)
MCHC: 34 g/dL (ref 30.0–36.0)
MCV: 96.7 fl (ref 78.0–100.0)
Monocytes Absolute: 0.6 10*3/uL (ref 0.1–1.0)
Monocytes Relative: 10.8 % (ref 3.0–12.0)
Neutro Abs: 2.9 10*3/uL (ref 1.4–7.7)
Neutrophils Relative %: 50.9 % (ref 43.0–77.0)
Platelets: 176 10*3/uL (ref 150.0–400.0)
RBC: 4.88 Mil/uL (ref 4.22–5.81)
RDW: 13.8 % (ref 11.5–15.5)
WBC: 5.6 10*3/uL (ref 4.0–10.5)

## 2018-11-07 LAB — COMPREHENSIVE METABOLIC PANEL
ALT: 22 U/L (ref 0–53)
AST: 17 U/L (ref 0–37)
Albumin: 4.1 g/dL (ref 3.5–5.2)
Alkaline Phosphatase: 74 U/L (ref 39–117)
BUN: 14 mg/dL (ref 6–23)
CO2: 30 mEq/L (ref 19–32)
Calcium: 9.4 mg/dL (ref 8.4–10.5)
Chloride: 104 mEq/L (ref 96–112)
Creatinine, Ser: 0.82 mg/dL (ref 0.40–1.50)
GFR: 90.28 mL/min (ref >60.00)
Glucose, Bld: 107 mg/dL — ABNORMAL HIGH (ref 70–99)
Potassium: 4.4 mEq/L (ref 3.5–5.1)
Sodium: 140 mEq/L (ref 135–145)
Total Bilirubin: 0.6 mg/dL (ref 0.2–1.2)
Total Protein: 6.8 g/dL (ref 6.0–8.3)

## 2018-11-07 LAB — HEMOGLOBIN A1C: Hgb A1c MFr Bld: 5.9 % (ref 4.6–6.5)

## 2018-11-07 NOTE — Assessment & Plan Note (Signed)
Follows with urology - to have MRA

## 2018-11-07 NOTE — Assessment & Plan Note (Signed)
Uses cpap nightly  

## 2018-11-07 NOTE — Assessment & Plan Note (Signed)
No cp, palps, sob Encouraged increased activity Continue current medications Following with cardiology

## 2018-11-07 NOTE — Patient Instructions (Addendum)
  Tests ordered today. Your results will be released to [facility] (or called to you) after review, usually within 72hours after test completion. If any changes need to be made, you will be notified at that same time.   Medications reviewed and updated.  Changes include :   none   Please followup in one year

## 2018-11-07 NOTE — Assessment & Plan Note (Signed)
Check lipid panel  Continue daily statin Regular exercise and healthy diet encouraged  

## 2018-11-07 NOTE — Assessment & Plan Note (Signed)
Check a1c Low sugar / carb diet Stressed regular exercise   

## 2018-11-09 ENCOUNTER — Ambulatory Visit: Payer: Medicare Other | Admitting: Cardiovascular Disease

## 2018-11-10 ENCOUNTER — Telehealth: Payer: Self-pay | Admitting: *Deleted

## 2018-11-10 NOTE — Telephone Encounter (Signed)
   Cardiac Questionnaire:    Since your last visit or hospitalization:    1. Have you been having new or worsening chest pain? NO   2. Have you been having new or worsening shortness of breath? NO 3. Have you been having new or worsening leg swelling, wt gain, or increase in abdominal girth (pants fitting more tightly)? NO   4. Have you had any passing out spells? NO    *A YES to any of these questions would result in the appointment being kept. *If all the answers to these questions are NO, we should indicate that given the current situation regarding the worldwide coronarvirus pandemic, at the recommendation of the CDC, we are looking to limit gatherings in our waiting area, and thus will reschedule their appointment beyond four weeks from today.          Primary Cardiologist:  Skeet Latch, MD   Patient contacted.  History reviewed.  No symptoms to suggest any unstable cardiac conditions.  Based on discussion, with current pandemic situation, we will be postponing this appointment. If symptoms change, he has been instructed to contact our office.   Routing to C19 CANCEL pool for tracking (P CV DIV CV19 CANCEL) and assigning priority [place], LPN  3/00/9233 0:07 PM         .

## 2018-11-14 ENCOUNTER — Ambulatory Visit: Payer: Medicare Other | Admitting: Cardiovascular Disease

## 2018-11-22 ENCOUNTER — Encounter: Payer: Self-pay | Admitting: *Deleted

## 2018-11-27 ENCOUNTER — Other Ambulatory Visit: Payer: Self-pay

## 2018-11-27 ENCOUNTER — Telehealth (INDEPENDENT_AMBULATORY_CARE_PROVIDER_SITE_OTHER): Payer: Medicare Other | Admitting: Cardiovascular Disease

## 2018-11-27 ENCOUNTER — Encounter: Payer: Self-pay | Admitting: Cardiovascular Disease

## 2018-11-27 VITALS — BP 146/76 | HR 73 | Ht 68.0 in | Wt 208.0 lb

## 2018-11-27 DIAGNOSIS — I10 Essential (primary) hypertension: Secondary | ICD-10-CM | POA: Diagnosis not present

## 2018-11-27 DIAGNOSIS — Z5181 Encounter for therapeutic drug level monitoring: Secondary | ICD-10-CM

## 2018-11-27 DIAGNOSIS — E7800 Pure hypercholesterolemia, unspecified: Secondary | ICD-10-CM | POA: Diagnosis not present

## 2018-11-27 DIAGNOSIS — I700 Atherosclerosis of aorta: Secondary | ICD-10-CM | POA: Diagnosis not present

## 2018-11-27 DIAGNOSIS — I2510 Atherosclerotic heart disease of native coronary artery without angina pectoris: Secondary | ICD-10-CM

## 2018-11-27 MED ORDER — ATORVASTATIN CALCIUM 80 MG PO TABS: 80.0000 mg | ORAL_TABLET | Freq: Every day | ORAL | 3 refills | Status: DC

## 2018-11-27 NOTE — Telephone Encounter (Signed)
Patient had virtual visit today.

## 2018-11-27 NOTE — Progress Notes (Signed)
Virtual Visit via Video Note   This visit type was conducted due to national recommendations for restrictions regarding the COVID-19 Pandemic (e.g. social distancing) in an effort to limit this patient's exposure and mitigate transmission in our community.  Due to his co-morbid illnesses, this patient is at least at moderate risk for complications without adequate follow up.  This format is felt to be most appropriate for this patient at this time.  All issues noted in this document were discussed and addressed.  A limited physical exam was performed with this format.  Please refer to the patient's chart for his consent to telehealth for [facility].   Evaluation Performed:  Follow-up visit  Date:  11/27/2018   ID:  Jason Compton, DOB 1936/04/12, MRN [id number]  Patient Location: Home  Provider Location: Office  PCP:  Binnie Rail, MD  Cardiologist:  Skeet Latch, MD  Electrophysiologist:  None   Chief Complaint:  Follow up  History of Present Illness:    Jason Compton is an 83 y.o. male who presents via audio/video conferencing for a telehealth visit today.    Jason Compton is an 83 y.o. male with hypertension, hyperlipidemia, OSA on CPAP,  and Celiac disease who presents for follow up.  Jason Compton was previously a patient of [name].  He has been doing well and denies any chest pain or shortness of breath.  Since his last appointment Mr. Crutchfield has been well.  He was travelling [place] throughou [place], [place] and [place].Farrel Conners they returned he and his wife had a cough.  They ever had fever or chills.  These symptoms have improved over the last three weeks.  He hasn't experienced any chest pain or shortness of breath.  They try to walk to exercise and have no exertional symptoms.  He denies lower extremity edema, orthopnea or PND.  He has been using the CPAP regularly for a year.  He notes that he sleeps more soundly and has less nocturia on the CPAP  machine.  BP has been typically running around 130/65.   The patient does not have symptoms concerning for COVID-19 infection (fever, chills, cough, or new shortness of breath).    Past Medical History:  Diagnosis Date  . Exogenous obesity   . GERD (gastroesophageal reflux disease)   . History of hiatal hernia   . Hyperlipidemia   . Hypertension   . PVC's (premature ventricular contractions)   . Skin cancer    Past Surgical History:  Procedure Laterality Date  . CARDIOVASCULAR STRESS TEST  04/18/2009   NORMAL  . CATARACT EXTRACTION       Current Meds  Medication Sig  . amLODipine (NORVASC) 5 MG tablet TAKE 1 TABLET BY MOUTH EVERY DAY  . aspirin 81 MG tablet Take 81 mg by mouth daily.    Marland Kitchen atorvastatin (LIPITOR) 40 MG tablet TAKE 1 TABLET (40 MG TOTAL) BY MOUTH DAILY. FOLLOW UP NEEDED  . cholecalciferol (VITAMIN D) 1000 UNITS tablet Take 1,000 Units by mouth daily.   . cycloSPORINE (RESTASIS) 0.05 % ophthalmic emulsion Place 1 drop into both eyes 2 (two) times daily.  . diphenoxylate-atropine (LOMOTIL) 2.5-0.025 MG tablet Take 1 tablet by mouth 4 (four) times daily as needed for diarrhea or loose stools.  . Multiple Vitamin (MULTIVITAMIN) tablet Take 1 tablet by mouth daily.    Marland Kitchen omeprazole (PRILOSEC) 20 MG capsule Take 20 mg by mouth daily.   Marland Kitchen OVER THE COUNTER MEDICATION hydroEyes 2  tablets twice a day  . vitamin C (ASCORBIC ACID) 500 MG tablet Take 500 mg by mouth daily.       Allergies:   Altace [ramipril]   Social History   Tobacco Use  . Smoking status: Former Smoker    Last attempt to quit: 08/23/2006    Years since quitting: 12.2  . Smokeless tobacco: Never Used  Substance Use Topics  . Alcohol use: Yes    Alcohol/week: 0.0 standard drinks    Comment: wine  . Drug use: No     Family Hx: The patient's family history includes Cancer in his father; Hypertension in his brother; Osteoporosis in his mother.  ROS:   Please see the history of present illness.      All other systems reviewed and are negative.   Prior CV studies:   The following studies were reviewed today:    Labs/Other Tests and Data Reviewed:    EKG:  not assessed today  Recent Labs: 11/07/2018: ALT 22; BUN 14; Creatinine, Ser 0.82; Hemoglobin 16.1; Platelets 176.0; Potassium 4.4; Sodium 140   Recent Lipid Panel Lab Results  Component Value Date/Time   CHOL 184 11/07/2018 09:40 AM   CHOL 165 05/05/2017 08:32 AM   TRIG 78.0 11/07/2018 09:40 AM   HDL 68.30 11/07/2018 09:40 AM   HDL 68 05/05/2017 08:32 AM   CHOLHDL 3 11/07/2018 09:40 AM   LDLCALC 100 (H) 11/07/2018 09:40 AM   LDLCALC 86 05/05/2017 08:32 AM   LDLDIRECT 189.7 12/10/2011 09:01 AM    Wt Readings from Last 3 Encounters:  11/27/18 208 lb (94.3 kg)  11/07/18 212 lb (96.2 kg)  10/24/18 211 lb 12.8 oz (96.1 kg)     Objective:    .BP (!) 146/76   Pulse 73   Ht 5\' 8"  (1.727 m)   Wt 208 lb (94.3 kg)   BMI 31.63 kg/m  GENERAL: Well-appearing.  No acute distress. HEENT: Pupils equal round.  Oral mucosa unremarkable NECK:  No jugular venous distention, no visible thyromegaly EXT:  No edema, no cyanosis no clubbing SKIN:  No rashes no nodules NEURO:  Speech fluent.  Cranial nerves grossly intact.  Moves all 4 extremities freely PSYCH:  Cognitively intact, oriented to person place and time   ASSESSMENT & PLAN:    # Hypertension:  Blood pressure elevated today but has been generally <130/80.  He will track at home and call if it remains above goal.  Continue amlodipine.  # Hyperlipidemia.  LDL100 on 11/2018.   He was noted to have coronary calcifications on chest CT. Goal is LDL <70.  We will increase atorvastatin to 80mg .  Repeat lipids/CMP in 3 months.  # Coronary calcification:  Continue aspirin 81 mg daily.  Increase exercise to at least 30-40 minutes most days of the week.  Lipid management as above.  # OSA: He continues to use his CPAP regularly and is doing well.  He follows with Dr. Claiborne Billings.   COVID-19 Education: The signs and symptoms of COVID-19 were discussed with the patient and how to seek care for testing (follow up with PCP or arrange E-visit).  The importance of social distancing was discussed today.  Time:   Today, I have spent 17 minutes with the patient with telehealth technology discussing the above problems.     Medication Adjustments/Labs and Tests Ordered: Current medicines are reviewed at length with the patient today.  Concerns regarding medicines are outlined above.   Tests Ordered: No orders of the defined types  were placed in this encounter.  Medication Changes: No orders of the defined types were placed in this encounter.   Disposition:  Follow up with Jamarea Selner C. Oval Linsey, MD, [facility] in 1 year(s)  Signed, Skeet Latch, MD  11/27/2018 3:03 PM    [facility]

## 2018-11-27 NOTE — Patient Instructions (Signed)
Medication Instructions:  INCREASE YOUR ATORVASTATIN TO 80 MG DAILY  If you need a refill on your cardiac medications before your next appointment, please call your pharmacy.   Lab work: FASTING LP/CMET IN 3 MONTHS  If you have labs (blood work) drawn today and your tests are completely normal, you will receive your results only by: Marland Kitchen MyChart Message (if you have MyChart) OR . A paper copy in the mail If you have any lab test that is abnormal or we need to change your treatment, we will call you to review the results.  Testing/Procedures: NONE  Follow-Up: At [facility], you and your health needs are our priority.  As part of our continuing mission to provide you with exceptional heart care, we have created designated Provider Care Teams.  These Care Teams include your primary Cardiologist (physician) and Advanced Practice Providers (APPs -  Physician Assistants and Nurse Practitioners) who all work together to provide you with the care you need, when you need it. You will need a follow up appointment in 12 months.  Please call our office 2 months in advance to schedule this appointment.  You may see Skeet Latch, MD or one of the following Advanced Practice Providers on your designated Care Team:   Kerin Ransom, PA-C Roby Lofts, [place] . Sande Rives, PA-C  Any Other Special Instructions Will Be Listed Below (If Applicable). MONITOR YOU BLOOD PRESSURE AT HOME. IF IT REMAINS OVER 130/80 CALL THE OFFICE AT [phone]

## 2018-12-14 ENCOUNTER — Ambulatory Visit
Admission: RE | Admit: 2018-12-14 | Discharge: 2018-12-14 | Disposition: A | Discharge status: Home / Self Care | Payer: Medicare Other | Source: Ambulatory Visit | Attending: Urology | Admitting: Urology

## 2018-12-14 ENCOUNTER — Other Ambulatory Visit: Payer: Self-pay

## 2018-12-14 DIAGNOSIS — C61 Malignant neoplasm of prostate: Secondary | ICD-10-CM | POA: Diagnosis not present

## 2018-12-14 MED ORDER — GADOBENATE DIMEGLUMINE 529 MG/ML IV SOLN
20.0000 mL | Freq: Once | INTRAVENOUS | Status: AC | PRN
  Administered 2018-12-14: 20 mL via INTRAVENOUS

## 2019-01-24 ENCOUNTER — Telehealth: Payer: Self-pay | Admitting: Cardiovascular Disease

## 2019-01-24 NOTE — Telephone Encounter (Signed)
Spoke to patient he stated he has been having joint pain for the past 1 month.Stated he looked up side effects on atorvastatin.Advised to hold atorvastatin for the next 2 weeks and call office back to report how he is doing.Advised Dr.Panama is out of office.I will send message to her nurse.

## 2019-01-24 NOTE — Telephone Encounter (Signed)
Called patient, LVM advising of instructions-  Left call back number.

## 2019-01-24 NOTE — Telephone Encounter (Signed)
If pain gone, please resume atorvastatin at 1/2 tablet every other day. (dose decrease help decrease side effects)   May try rosuvastatin 10mg  every other day instead.

## 2019-01-24 NOTE — Telephone Encounter (Signed)
Please advise, while Rip Harbour is out this week.  Thank you!

## 2019-01-24 NOTE — Telephone Encounter (Signed)
New message   Pt c/o medication issue:  1. Name of Medication: Atovastatin  2. How are you currently taking this medication (dosage and times per day)? 80mg  1 tablet by mouth daily  3. Are you having a reaction (difficulty breathing--STAT)? No  4. What is your medication issue? Patient believes it's giving him joint pain and would like to talk about it with a nurse.  He was asking for [facility]

## 2019-02-07 ENCOUNTER — Telehealth: Payer: Self-pay

## 2019-02-07 ENCOUNTER — Other Ambulatory Visit: Payer: Medicare Other

## 2019-02-07 DIAGNOSIS — Z1159 Encounter for screening for other viral diseases: Secondary | ICD-10-CM | POA: Diagnosis not present

## 2019-02-07 NOTE — Telephone Encounter (Signed)
ordered

## 2019-02-07 NOTE — Telephone Encounter (Signed)
Left voice mail on how phD suggested to take atorvastatin which is on previous note, it shows VM was already left, I repeated message and asked him to call back if this was not the correct information that he was requesting.

## 2019-02-07 NOTE — Telephone Encounter (Signed)
Pt stopped atorvastatin 2 weeks ago due to joint pain when dose was increased. Pts SX  Have significantly been reduced since stopping but still has pain in hips/ buttocks and shoulders bilaterally. Using Advil am/pm for relief. Wants to know if he should go back on atorvastatin now or wait till pain is gone. Pt told dr/nurse out of office today but will receive call tomorrow, pt was understanding and will wait till tomorrow for advice.

## 2019-02-07 NOTE — Telephone Encounter (Signed)
Patient called stating someone had called him today, he states the message that was left does not answer his question. He still has question about he medication.

## 2019-02-07 NOTE — Telephone Encounter (Signed)
Follow up   Per the previous message patient has question about how to proceed with atorvastatin. Please call to discuss.

## 2019-02-07 NOTE — Telephone Encounter (Signed)
Would like to have covid antibody test put in.

## 2019-02-08 ENCOUNTER — Encounter: Payer: Self-pay | Admitting: Internal Medicine

## 2019-02-08 LAB — SAR COV2 SEROLOGY (COVID19)AB(IGG),IA: SARS CoV2 AB IGG: NEGATIVE

## 2019-02-08 NOTE — Telephone Encounter (Signed)
Spoke with patient and his pain has improved but not completely gone. Patient tolerated Atorvastatin 20 mg and 40 mg without any problems, only started after increasing to 80 mg daily. Advised patient to continue to hold for 2 more weeks and call back with update.

## 2019-02-08 NOTE — Telephone Encounter (Signed)
Pt should continue to hold statin. I will include our Pharmacist colleagues who have been involved in his statin dosing. Please let me know how else I can help.

## 2019-02-09 NOTE — Telephone Encounter (Signed)
When pain ALL gone, please resume atorvastatin at 1/2 tablet every other day for 4 weeks, then increase to every days as tolerated.  May try rosuvastatin 10mg  every other day instead and increase to 10mg  daily as tolerated

## 2019-02-14 ENCOUNTER — Encounter: Payer: Self-pay | Admitting: Internal Medicine

## 2019-02-14 DIAGNOSIS — H9190 Unspecified hearing loss, unspecified ear: Secondary | ICD-10-CM

## 2019-02-22 ENCOUNTER — Telehealth: Payer: Self-pay | Admitting: Cardiovascular Disease

## 2019-02-22 NOTE — Telephone Encounter (Signed)
Patient states you had a conversation with him about him stopping atorvastatin (LIPITOR) 80 MG tablet two weeks ago, he wants to let you know how he is doing as you instructed him to do.

## 2019-02-22 NOTE — Telephone Encounter (Addendum)
Spoke with patient regarding update on how he was doing since holding his Atorvastatin. His hip pain has subsided but he continues to have bilateral arm/shoulder pain. Patient describes pain as feeling like it is under the deltoid muscle not worse with pressing on any particular area. He has the same pain in both arms worse with particular movements. However it is always there in a dull aching pain. He does not have muscle aches anywhere other than arms/shoulders. He is going to contact his orthopedist but would like to know what to do in regards to his Atorvastatin. Will forward to Pharm D for review

## 2019-02-26 NOTE — Telephone Encounter (Signed)
Advised patient, verbalized understanding  

## 2019-02-26 NOTE — Telephone Encounter (Signed)
Please resume atorvastatin at 1/2 tablet every other day for 4 weeks, then increase to every days as tolerated.

## 2019-02-27 DIAGNOSIS — M25512 Pain in left shoulder: Secondary | ICD-10-CM | POA: Diagnosis not present

## 2019-02-27 DIAGNOSIS — M25511 Pain in right shoulder: Secondary | ICD-10-CM | POA: Diagnosis not present

## 2019-02-28 ENCOUNTER — Telehealth: Payer: Self-pay | Admitting: Cardiovascular Disease

## 2019-02-28 DIAGNOSIS — H903 Sensorineural hearing loss, bilateral: Secondary | ICD-10-CM | POA: Diagnosis not present

## 2019-02-28 NOTE — Telephone Encounter (Signed)
Received note from orthopedist and advised patient ok to hold off on resuming Atorvastatin until reviewed by Raquel Pharm D Monday when she returns

## 2019-02-28 NOTE — Telephone Encounter (Signed)
  Patient is calling to go over his discussion with the ortho doctor

## 2019-03-08 NOTE — Telephone Encounter (Signed)
Patient has remained off statin and scheduled lipid clinic visit for next week

## 2019-03-09 ENCOUNTER — Telehealth: Payer: Self-pay | Admitting: *Deleted

## 2019-03-09 DIAGNOSIS — Z5181 Encounter for therapeutic drug level monitoring: Secondary | ICD-10-CM

## 2019-03-09 DIAGNOSIS — I10 Essential (primary) hypertension: Secondary | ICD-10-CM

## 2019-03-09 DIAGNOSIS — E7800 Pure hypercholesterolemia, unspecified: Secondary | ICD-10-CM

## 2019-03-09 NOTE — Telephone Encounter (Signed)
Discussed follow up labs with Raquel Pharm D and lipid clinic appt. Patient will have labs done next week and lipid clinic after. Will forward to lipid clinic to schedule follow up visit

## 2019-03-12 ENCOUNTER — Telehealth: Payer: Self-pay | Admitting: Pharmacist

## 2019-03-12 NOTE — Telephone Encounter (Signed)
LMOM:Patient to call back  Needs lipid clinic appointment for Friday 7/24 or following week after completing fast lipid panel.

## 2019-03-13 ENCOUNTER — Ambulatory Visit: Payer: Medicare Other

## 2019-03-14 DIAGNOSIS — E7800 Pure hypercholesterolemia, unspecified: Secondary | ICD-10-CM | POA: Diagnosis not present

## 2019-03-14 DIAGNOSIS — I10 Essential (primary) hypertension: Secondary | ICD-10-CM | POA: Diagnosis not present

## 2019-03-14 DIAGNOSIS — Z5181 Encounter for therapeutic drug level monitoring: Secondary | ICD-10-CM | POA: Diagnosis not present

## 2019-03-14 LAB — LIPID PANEL
Chol/HDL Ratio: 3.4 ratio (ref 0.0–5.0)
Cholesterol, Total: 236 mg/dL — ABNORMAL HIGH (ref 100–199)
HDL: 70 mg/dL (ref >39)
LDL Calculated: 152 mg/dL — ABNORMAL HIGH (ref 0–99)
Triglycerides: 68 mg/dL (ref 0–149)
VLDL Cholesterol Cal: 14 mg/dL (ref 5–40)

## 2019-03-14 LAB — COMPREHENSIVE METABOLIC PANEL
ALT: 15 IU/L (ref 0–44)
AST: 17 IU/L (ref 0–40)
Albumin/Globulin Ratio: 1.8 (ref 1.2–2.2)
Albumin: 4.2 g/dL (ref 3.7–4.7)
Alkaline Phosphatase: 74 IU/L (ref 39–117)
BUN/Creatinine Ratio: 13 (ref 10–24)
BUN: 11 mg/dL (ref 8–27)
Bilirubin Total: 0.6 mg/dL (ref 0.0–1.2)
CO2: 24 mmol/L (ref 20–29)
Calcium: 9 mg/dL (ref 8.6–10.2)
Chloride: 98 mmol/L (ref 96–106)
Creatinine, Ser: 0.83 mg/dL (ref 0.76–1.27)
GFR calc Af Amer: 96 mL/min/{1.73_m2} (ref >59)
GFR calc non Af Amer: 83 mL/min/{1.73_m2} (ref >59)
Globulin, Total: 2.3 g/dL (ref 1.5–4.5)
Glucose: 107 mg/dL — ABNORMAL HIGH (ref 65–99)
Potassium: 5.2 mmol/L (ref 3.5–5.2)
Sodium: 139 mmol/L (ref 134–144)
Total Protein: 6.5 g/dL (ref 6.0–8.5)

## 2019-03-15 NOTE — Telephone Encounter (Signed)
Lipid appt

## 2019-03-21 ENCOUNTER — Other Ambulatory Visit: Payer: Self-pay

## 2019-03-22 ENCOUNTER — Ambulatory Visit (INDEPENDENT_AMBULATORY_CARE_PROVIDER_SITE_OTHER): Payer: Medicare Other | Admitting: Pharmacist

## 2019-03-22 ENCOUNTER — Other Ambulatory Visit: Payer: Self-pay

## 2019-03-22 VITALS — BP 142/80 | HR 80 | Ht 68.0 in | Wt 213.8 lb

## 2019-03-22 DIAGNOSIS — E7800 Pure hypercholesterolemia, unspecified: Secondary | ICD-10-CM

## 2019-03-22 DIAGNOSIS — I2510 Atherosclerotic heart disease of native coronary artery without angina pectoris: Secondary | ICD-10-CM

## 2019-03-22 MED ORDER — EZETIMIBE 10 MG PO TABS: 10.0000 mg | ORAL_TABLET | Freq: Every day | ORAL | 3 refills | Status: DC

## 2019-03-22 MED ORDER — ATORVASTATIN CALCIUM 40 MG PO TABS: 40.0000 mg | ORAL_TABLET | Freq: Every day | ORAL | 3 refills | Status: DC

## 2019-03-22 NOTE — Patient Instructions (Addendum)
Lipid Clinic (pharmacist) Jason Compton/Jason Compton [phone]  *START ezetimibe 10mg  (Zetia) daily * * START atorvastatin 40mg  twice weekly (Monday and Friday); 1st dose April 06, 2019* *INCREASE atorvastatin dose by 1 tables every week until able to take 1 tablets 4 times per week* REPEAT fasting blood work the week of September 25th*  High Cholesterol  High cholesterol is a condition in which the blood has high levels of a white, waxy, fat-like substance (cholesterol). The human body needs small amounts of cholesterol. The liver makes all the cholesterol that the body needs. Extra (excess) cholesterol comes from the food that we eat. Cholesterol is carried from the liver by the blood through the blood vessels. If you have high cholesterol, deposits (plaques) may build up on the walls of your blood vessels (arteries). Plaques make the arteries narrower and stiffer. Cholesterol plaques increase your risk for heart attack and stroke. Work with your health care provider to keep your cholesterol levels in a healthy range. What increases the risk? This condition is more likely to develop in people who:  Eat foods that are high in animal fat (saturated fat) or cholesterol.  Are overweight.  Are not getting enough exercise.  Have a family history of high cholesterol. What are the signs or symptoms? There are no symptoms of this condition. How is this diagnosed? This condition may be diagnosed from the results of a blood test.  If you are older than age 45, your health care provider may check your cholesterol every 4-6 years.  You may be checked more often if you already have high cholesterol or other risk factors for heart disease. The blood test for cholesterol measures:  "Bad" cholesterol (LDL cholesterol). This is the main type of cholesterol that causes heart disease. The desired level for LDL is less than 100.  "Good" cholesterol (HDL cholesterol). This type helps to protect against heart  disease by cleaning the arteries and carrying the LDL away. The desired level for HDL is 60 or higher.  Triglycerides. These are fats that the body can store or burn for energy. The desired number for triglycerides is lower than 150.  Total cholesterol. This is a measure of the total amount of cholesterol in your blood, including LDL cholesterol, HDL cholesterol, and triglycerides. A healthy number is less than 200. How is this treated? This condition is treated with diet changes, lifestyle changes, and medicines. Diet changes  This may include eating more whole grains, fruits, vegetables, nuts, and fish.  This may also include cutting back on red meat and foods that have a lot of added sugar. Lifestyle changes  Changes may include getting at least 40 minutes of aerobic exercise 3 times a week. Aerobic exercises include walking, biking, and swimming. Aerobic exercise along with a healthy diet can help you maintain a healthy weight.  Changes may also include quitting smoking. Medicines  Medicines are usually given if diet and lifestyle changes have failed to reduce your cholesterol to healthy levels.  Your health care provider may prescribe a statin medicine. Statin medicines have been shown to reduce cholesterol, which can reduce the risk of heart disease. Follow these instructions at home: Eating and drinking If told by your health care provider:  Eat chicken (without skin), fish, veal, shellfish, ground [place] breast, and round or loin cuts of red meat.  Do not eat fried foods or fatty meats, such as hot dogs and salami.  Eat plenty of fruits, such as apples.  Eat plenty of vegetables, such as  broccoli, potatoes, and carrots.  Eat beans, peas, and lentils.  Eat grains such as barley, rice, couscous, and bulgur wheat.  Eat pasta without cream sauces.  Use skim or nonfat milk, and eat low-fat or nonfat yogurt and cheeses.  Do not eat or drink whole milk, cream, ice cream,  egg yolks, or hard cheeses.  Do not eat stick margarine or tub margarines that contain trans fats (also called partially hydrogenated oils).  Do not eat saturated tropical oils, such as coconut oil and palm oil.  Do not eat cakes, cookies, crackers, or other baked goods that contain trans fats.  General instructions  Exercise as directed by your health care provider. Increase your activity level with activities such as gardening, walking, and taking the stairs.  Take over-the-counter and prescription medicines only as told by your health care provider.  Do not use any products that contain nicotine or tobacco, such as cigarettes and e-cigarettes. If you need help quitting, ask your health care provider.  Keep all follow-up visits as told by your health care provider. This is important. Contact a health care provider if:  You are struggling to maintain a healthy diet or weight.  You need help to start on an exercise program.  You need help to stop smoking. Get help right away if:  You have chest pain.  You have trouble breathing. This information is not intended to replace advice given to you by your health care provider. Make sure you discuss any questions you have with your health care provider. Document Released: 08/09/2005 Document Revised: 08/12/2017 Document Reviewed: 02/07/2016 Elsevier Patient Education  2020 [employer].

## 2019-03-22 NOTE — Progress Notes (Signed)
Patient ID: Jason Compton                 DOB: 1936/08/04                    MRN: [id number]     HPI:  Jason Compton is a 83 y.o. male patient referred to lipid clinic by Dr. Oval Linsey. PMH is significant for hypertension, hyperlipidemia, OSA on CPAP,coronary calcification, celiac disease and PVCs.   Mr Jason Compton presets to clinic for lipid management and potential PCSK9i initiation. He used atorvastatin 40mg  daily for over 5 years without problems but developed severe shoulder and hip pain 1 week after increasing the dose to 80mg  daily. He is currently following up with orthopedic doctor for pain management and stopped taking atorvastatin ~ 4 weeks ago as instructed.   Current Medications: none  Intolerances:  atorvastatin 80mg  daily (severe muscle pain)  atorvastatin 40mg  daily - lack therapeutic response  LDL goal: <70 mg/dL   Diet: balanced diet but working on decreasing fat I diet  Exercise: 3-4 times per week  Family History: The patient's family history includes Cancer in his father; Hypertension in his brother; Osteoporosis in his mother.  Social History: former smoker, occasional alcohol  Labs: 03/14/2019: CHO 236, TG 68, HDL 70, LDL-c 152 (no medication for ~ 4weeks) 11/07/2018: CHO 184, TG 78, HDL 68, LDL-c 100 (atorvastatin 80mg  daily)  Past Medical History:  Diagnosis Date  . Exogenous obesity   . GERD (gastroesophageal reflux disease)   . History of hiatal hernia   . Hyperlipidemia   . Hypertension   . PVC's (premature ventricular contractions)   . Skin cancer     Current Outpatient Medications on File Prior to Visit  Medication Sig Dispense Refill  . amLODipine (NORVASC) 5 MG tablet TAKE 1 TABLET BY MOUTH EVERY DAY 90 tablet 3  . aspirin 81 MG tablet Take 81 mg by mouth daily.      . cholecalciferol (VITAMIN D) 1000 UNITS tablet Take 1,000 Units by mouth daily.     . cycloSPORINE (RESTASIS) 0.05 % ophthalmic emulsion Place 1 drop into both eyes 2 (two)  times daily.    . diphenoxylate-atropine (LOMOTIL) 2.5-0.025 MG tablet Take 1 tablet by mouth 4 (four) times daily as needed for diarrhea or loose stools. 30 tablet 0  . Multiple Vitamin (MULTIVITAMIN) tablet Take 1 tablet by mouth daily.      . naproxen (NAPROSYN) 500 MG tablet Take 500 mg by mouth 2 (two) times daily with a meal.    . omeprazole (PRILOSEC) 20 MG capsule Take 20 mg by mouth daily.     Marland Kitchen OVER THE COUNTER MEDICATION hydroEyes 2 tablets twice a day    . vitamin C (ASCORBIC ACID) 500 MG tablet Take 500 mg by mouth daily.       No current facility-administered medications on file prior to visit.     Allergies  Allergen Reactions  . Altace [Ramipril]     Doesn't recall reaction    Hypercholesterolemia LDL-c is above goal for secondary prevention. Patient was unable to tolerate atorvastatin 80gm, but took atorvastatin 40mg  daily for over 5 years without problems.   Will start ezetimibe 10mg  daily today and resume atorvastatin 40mg  on August 14 at 1 tablet twice weekly. [name] is to increase atorvastatin dose by 1 table every week until able to tolerate 1 tablet daily. Mr Jason Compton will continue to work on positive lifestyle modifications and repeat fasting Lipid panel in  2 months.   Jason Compton PharmD, BCPS, [facility] [address] [place] [address] 03/28/2019 10:50 AM

## 2019-03-28 ENCOUNTER — Encounter: Payer: Self-pay | Admitting: Pharmacist

## 2019-03-28 NOTE — Assessment & Plan Note (Signed)
LDL-c is above goal for secondary prevention. Patient was unable to tolerate atorvastatin 80gm, but took atorvastatin 40mg  daily for over 5 years without problems.   Will start ezetimibe 10mg  daily today and resume atorvastatin 40mg  on August 14 at 1 tablet twice weekly. [name] is to increase atorvastatin dose by 1 table every week until able to tolerate 1 tablet daily. Mr Kulish will continue to work on positive lifestyle modifications and repeat fasting Lipid panel in 2 months.

## 2019-04-05 DIAGNOSIS — H04123 Dry eye syndrome of bilateral lacrimal glands: Secondary | ICD-10-CM | POA: Diagnosis not present

## 2019-04-05 DIAGNOSIS — H524 Presbyopia: Secondary | ICD-10-CM | POA: Diagnosis not present

## 2019-04-05 DIAGNOSIS — Z961 Presence of intraocular lens: Secondary | ICD-10-CM | POA: Diagnosis not present

## 2019-05-02 ENCOUNTER — Ambulatory Visit: Payer: Medicare Other

## 2019-05-02 NOTE — Patient Instructions (Addendum)
   Flu immunization administered today.     Medications reviewed and updated.  Changes include :   none     Please followup in 6 months   

## 2019-05-02 NOTE — Progress Notes (Signed)
Subjective:    Patient ID: Jason Compton, male    DOB: [birth year], 83 y.o.   MRN: [id number]  HPI The patient is here for follow up.  He is exercising regularly - walking, stationary bike.     CAD, Hypertension: he is following with cardiology. He is taking his medication daily. He is compliant with a low sodium diet.  He denies chest pain, palpitations, edema, shortness of breath and regular headaches.    Hyperlipidemia: He is taking his medication daily. He is compliant with a low fat/cholesterol diet. He denies myalgias.   Prediabetes:  He is compliant with a low sugar/carbohydrate diet.  He is exercising regularly.   OSA:  He uses his cpap nightly.      Medications and allergies reviewed with patient and updated if appropriate.  Patient Active Problem List   Diagnosis Date Noted   Diarrhea 10/10/2018   Cough 10/10/2018   Herpes simplex 07/25/2018   Allergic rhinitis 07/25/2018   OSA on CPAP 05/08/2018   History of adenomatous polyp of colon 02/15/2018   Internal hemorrhoids 02/15/2018   Cervical spondylosis without myelopathy 10/13/2017   Aortic atherosclerosis ([place]) 04/28/2017   Coronary artery disease 04/28/2017   Prediabetes 11/02/2016   Diverticulosis 07/30/2013   Hard of hearing 07/30/2013   Hypertension 03/08/2011   Hypercholesterolemia 03/08/2011   PSA elevation 03/08/2011    Current Outpatient Medications on File Prior to Visit  Medication Sig Dispense Refill   amLODipine (NORVASC) 5 MG tablet TAKE 1 TABLET BY MOUTH EVERY DAY 90 tablet 3   aspirin 81 MG tablet Take 81 mg by mouth daily.       atorvastatin (LIPITOR) 40 MG tablet Take 40 mg by mouth daily. 4 times a week     cholecalciferol (VITAMIN D) 1000 UNITS tablet Take 1,000 Units by mouth daily.      cycloSPORINE (RESTASIS) 0.05 % ophthalmic emulsion Place 1 drop into both eyes 2 (two) times daily.     diphenoxylate-atropine (LOMOTIL) 2.5-0.025 MG tablet Take 1 tablet by  mouth 4 (four) times daily as needed for diarrhea or loose stools. 30 tablet 0   ezetimibe (ZETIA) 10 MG tablet Take 1 tablet (10 mg total) by mouth daily. 30 tablet 3   Multiple Vitamin (MULTIVITAMIN) tablet Take 1 tablet by mouth daily.       naproxen (NAPROSYN) 500 MG tablet Take 500 mg by mouth 2 (two) times daily with a meal.     omeprazole (PRILOSEC) 20 MG capsule Take 20 mg by mouth daily.      OVER THE COUNTER MEDICATION hydroEyes 2 tablets twice a day     vitamin C (ASCORBIC ACID) 500 MG tablet Take 500 mg by mouth daily.       No current facility-administered medications on file prior to visit.     Past Medical History:  Diagnosis Date   Exogenous obesity    GERD (gastroesophageal reflux disease)    History of hiatal hernia    Hyperlipidemia    Hypertension    PVC's (premature ventricular contractions)    Skin cancer     Past Surgical History:  Procedure Laterality Date   CARDIOVASCULAR STRESS TEST  04/18/2009   NORMAL   CATARACT EXTRACTION      Social History   Socioeconomic History   Marital status: Married    Spouse name: Not on file   Number of children: Not on file   Years of education: Not on file   Highest education  level: Not on file  Occupational History   Not on file  Social Needs   Financial resource strain: Not on file   Food insecurity    Worry: Not on file    Inability: Not on file   Transportation needs    Medical: Not on file    Non-medical: Not on file  Tobacco Use   Smoking status: Former Smoker    Quit date: 08/23/2006    Years since quitting: 12.7   Smokeless tobacco: Never Used  Substance and Sexual Activity   Alcohol use: Yes    Alcohol/week: 0.0 standard drinks    Comment: wine   Drug use: No   Sexual activity: Not on file  Lifestyle   Physical activity    Days per week: Not on file    Minutes per session: Not on file   Stress: Not on file  Relationships   Social connections    Talks on  phone: Not on file    Gets together: Not on file    Attends religious service: Not on file    Active member of club or organization: Not on file    Attends meetings of clubs or organizations: Not on file    Relationship status: Not on file  Other Topics Concern   Not on file  Social History Narrative   Not on file    Family History  Problem Relation Age of Onset   Osteoporosis Mother    Cancer Father        skin   Hypertension Brother     Review of Systems  Constitutional: Negative for chills and fever.  Respiratory: Negative for cough, shortness of breath and wheezing.   Cardiovascular: Negative for chest pain, palpitations and leg swelling.  Musculoskeletal: Positive for myalgias (left shoulder - residual from inc statin dose).  Neurological: Negative for light-headedness and headaches.       Objective:   Vitals:   05/03/19 0931  BP: 124/70  Pulse: 64  Temp: 98.5 F (36.9 C)  SpO2: 96%   BP Readings from Last 3 Encounters:  05/03/19 124/70  03/22/19 (!) 142/80  11/27/18 (!) 146/76   Wt Readings from Last 3 Encounters:  05/03/19 214 lb (97.1 kg)  03/22/19 213 lb 12.8 oz (97 kg)  11/27/18 208 lb (94.3 kg)   Body mass index is 32.54 kg/m.   Physical Exam    Constitutional: Appears well-developed and well-nourished. No distress.  HENT:  Head: Normocephalic and atraumatic.  Neck: Neck supple. No tracheal deviation present. No thyromegaly present.  No cervical lymphadenopathy Cardiovascular: Normal rate, regular rhythm and normal heart sounds.   No murmur heard. No carotid bruit .  No edema Pulmonary/Chest: Effort normal and breath sounds normal. No respiratory distress. No has no wheezes. No rales.  Skin: Skin is warm and dry. Not diaphoretic.  Psychiatric: Normal mood and affect. Behavior is normal.      Assessment & Plan:    See Problem List for Assessment and Plan of chronic medical problems.

## 2019-05-03 ENCOUNTER — Other Ambulatory Visit: Payer: Self-pay

## 2019-05-03 ENCOUNTER — Encounter: Payer: Self-pay | Admitting: Internal Medicine

## 2019-05-03 ENCOUNTER — Ambulatory Visit (INDEPENDENT_AMBULATORY_CARE_PROVIDER_SITE_OTHER): Payer: Medicare Other | Admitting: Internal Medicine

## 2019-05-03 VITALS — BP 124/70 | HR 64 | Temp 98.5°F | Ht 68.0 in | Wt 214.0 lb

## 2019-05-03 DIAGNOSIS — I2510 Atherosclerotic heart disease of native coronary artery without angina pectoris: Secondary | ICD-10-CM

## 2019-05-03 DIAGNOSIS — E7800 Pure hypercholesterolemia, unspecified: Secondary | ICD-10-CM | POA: Diagnosis not present

## 2019-05-03 DIAGNOSIS — I10 Essential (primary) hypertension: Secondary | ICD-10-CM

## 2019-05-03 DIAGNOSIS — Z23 Encounter for immunization: Secondary | ICD-10-CM | POA: Diagnosis not present

## 2019-05-03 DIAGNOSIS — R7303 Prediabetes: Secondary | ICD-10-CM | POA: Diagnosis not present

## 2019-05-03 LAB — POCT GLYCOSYLATED HEMOGLOBIN (HGB A1C): Hemoglobin A1C: 5.4 % (ref 4.0–5.6)

## 2019-05-03 NOTE — Assessment & Plan Note (Signed)
Unfortunately did not tolerate atorvastatin 80 mg due to myalgia/joint pain Currently taking Zetia, atorvastatin 40 mg 4 days a week and doing okay Does have some residual left shoulder pain We will have follow-up blood work later this month and sees cardiology next month Continue regular exercise and healthy diet

## 2019-05-03 NOTE — Assessment & Plan Note (Signed)
Blood pressure well controlled.  Continue current medications

## 2019-05-03 NOTE — Assessment & Plan Note (Signed)
Following with cardiology and has a follow-up appointment next month No concerning symptoms Exercising regularly Continue aspirin 81 mg daily, atorvastatin 40/Zetia

## 2019-05-03 NOTE — Assessment & Plan Note (Signed)
A1c today Continue regular exercise and healthy diet Follow-up in 6 months

## 2019-05-17 DIAGNOSIS — E7800 Pure hypercholesterolemia, unspecified: Secondary | ICD-10-CM | POA: Diagnosis not present

## 2019-05-17 LAB — LIPID PANEL WITH LDL/HDL RATIO
Cholesterol, Total: 147 mg/dL (ref 100–199)
HDL: 72 mg/dL (ref >39)
LDL Chol Calc (NIH): 65 mg/dL (ref 0–99)
LDL/HDL Ratio: 0.9 ratio (ref 0.0–3.6)
Triglycerides: 46 mg/dL (ref 0–149)
VLDL Cholesterol Cal: 10 mg/dL (ref 5–40)

## 2019-05-25 ENCOUNTER — Other Ambulatory Visit: Payer: Self-pay

## 2019-05-25 ENCOUNTER — Encounter: Payer: Self-pay | Admitting: Cardiovascular Disease

## 2019-05-25 ENCOUNTER — Ambulatory Visit (INDEPENDENT_AMBULATORY_CARE_PROVIDER_SITE_OTHER): Payer: Medicare Other | Admitting: Cardiovascular Disease

## 2019-05-25 VITALS — BP 140/74 | HR 77 | Temp 97.2°F | Ht 68.0 in | Wt 214.2 lb

## 2019-05-25 DIAGNOSIS — I2510 Atherosclerotic heart disease of native coronary artery without angina pectoris: Secondary | ICD-10-CM

## 2019-05-25 DIAGNOSIS — R0602 Shortness of breath: Secondary | ICD-10-CM | POA: Diagnosis not present

## 2019-05-25 DIAGNOSIS — Z5181 Encounter for therapeutic drug level monitoring: Secondary | ICD-10-CM

## 2019-05-25 DIAGNOSIS — I10 Essential (primary) hypertension: Secondary | ICD-10-CM | POA: Diagnosis not present

## 2019-05-25 DIAGNOSIS — E7800 Pure hypercholesterolemia, unspecified: Secondary | ICD-10-CM | POA: Diagnosis not present

## 2019-05-25 MED ORDER — ROSUVASTATIN CALCIUM 20 MG PO TABS: ORAL_TABLET | ORAL | 3 refills | Status: DC

## 2019-05-25 NOTE — Patient Instructions (Addendum)
Medication Instructions:  STOP ATORVASTATIN   START ROSUVASTATIN 20 MG 3 DAYS A WEEK   If you need a refill on your cardiac medications before your next appointment, please call your pharmacy.   Lab work: YOU WILL NEED A COVID SCREENING 3 DAYS PRIOR TO YOUR EXERCISE STRESS TEST   FASTING LP/CMET IN 3 MONTHS FEW DAYS BEFORE FOLLOW UP VISIT   If you have labs (blood work) drawn today and your tests are completely normal, you will receive your results only by: Marland Kitchen MyChart Message (if you have MyChart) OR . A paper copy in the mail If you have any lab test that is abnormal or we need to change your treatment, we will call you to review the results.  Testing/Procedures: Your physician has requested that you have en exercise stress myoview. For further information please visit [email or web address]. Please follow instruction sheet, as given.  Follow-Up: At [facility], you and your health needs are our priority.  As part of our continuing mission to provide you with exceptional heart care, we have created designated Provider Care Teams.  These Care Teams include your primary Cardiologist (physician) and Advanced Practice Providers (APPs -  Physician Assistants and Nurse Practitioners) who all work together to provide you with the care you need, when you need it. You will need a follow up appointment in 3 months.  Please call our office 2 months in advance to schedule this appointment.  You may see Skeet Latch, MD or one of the following Advanced Practice Providers on your designated Care Team:   Kerin Ransom, PA-C Roby Lofts, [place] . Sande Rives, PA-C  Any Other Special Instructions Will Be Listed Below (If Applicable).  Cardiac Nuclear Scan A cardiac nuclear scan is a test that is done to check the flow of blood to your heart. It is done when you are resting and when you are exercising. The test looks for problems such as:  Not enough blood reaching a portion of the heart.  The  heart muscle not working as it should. You may need this test if:  You have heart disease.  You have had lab results that are not normal.  You have had heart surgery or a balloon procedure to open up blocked arteries (angioplasty).  You have chest pain.  You have shortness of breath. In this test, a special dye (tracer) is put into your bloodstream. The tracer will travel to your heart. A camera will then take pictures of your heart to see how the tracer moves through your heart. This test is usually done at a hospital and takes 2-4 hours. Tell a doctor about:  Any allergies you have.  All medicines you are taking, including vitamins, herbs, eye drops, creams, and over-the-counter medicines.  Any problems you or family members have had with anesthetic medicines.  Any blood disorders you have.  Any surgeries you have had.  Any medical conditions you have.  Whether you are pregnant or may be pregnant. What are the risks? Generally, this is a safe test. However, problems may occur, such as:  Serious chest pain and heart attack. This is only a risk if the stress portion of the test is done.  Rapid heartbeat.  A feeling of warmth in your chest. This feeling usually does not last long.  Allergic reaction to the tracer. What happens before the test?  Ask your doctor about changing or stopping your normal medicines. This is important.  Follow instructions from your doctor about what you  cannot eat or drink.  Remove your jewelry on the day of the test. What happens during the test?  An IV tube will be inserted into one of your veins.  Your doctor will give you a small amount of tracer through the IV tube.  You will wait for 20-40 minutes while the tracer moves through your bloodstream.  Your heart will be monitored with an electrocardiogram (ECG).  You will lie down on an exam table.  Pictures of your heart will be taken for about 15-20 minutes.  You may also have a  stress test. For this test, one of these things may be done: ? You will be asked to exercise on a treadmill or a stationary bike. ? You will be given medicines that will make your heart work harder. This is done if you are unable to exercise.  When blood flow to your heart has peaked, a tracer will again be given through the IV tube.  After 20-40 minutes, you will get back on the exam table. More pictures will be taken of your heart.  Depending on the tracer that is used, more pictures may need to be taken 3-4 hours later.  Your IV tube will be removed when the test is over. The test may vary among doctors and hospitals. What happens after the test?  Ask your doctor: ? Whether you can return to your normal schedule, including diet, activities, and medicines. ? Whether you should drink more fluids. This will help to remove the tracer from your body. Drink enough fluid to keep your pee (urine) pale yellow.  Ask your doctor, or the department that is doing the test: ? When will my results be ready? ? How will I get my results? Summary  A cardiac nuclear scan is a test that is done to check the flow of blood to your heart.  Tell your doctor whether you are pregnant or may be pregnant.  Before the test, ask your doctor about changing or stopping your normal medicines. This is important.  Ask your doctor whether you can return to your normal activities. You may be asked to drink more fluids. This information is not intended to replace advice given to you by your health care provider. Make sure you discuss any questions you have with your health care provider. Document Released: 01/23/2018 Document Revised: 11/29/2018 Document Reviewed: 01/23/2018 Elsevier Patient Education  2020 [employer].

## 2019-05-25 NOTE — Progress Notes (Signed)
Cardiology Office Note   Date:  05/25/2019   ID:  Jason Compton, DOB February 23, 1936, MRN [id number]  PCP:  Binnie Rail, MD  Cardiologist:   Skeet Latch, MD   No chief complaint on file.    History of Present Illness: Jason Compton is a 83 y.o. male with non-obstructive CAD, hypertension, hyperlipidemia, OSA on CPAP,and Celiac disease who presents for follow up. Jason Compton was previously a patient of [name]. Jason Compton has been doing well and denies any chest pain or shortness of breath. Since his last appointment Jason Compton has been well.  Jason Compton was travelling [place] throughou [place], [place] and [place].Jason Compton they returned Jason Compton and his wife had a cough.  They ever had fever or chills.  These symptoms have improved over the last three weeks.  Jason Compton hasn't experienced any chest pain or shortness of breath.  They try to walk to exercise and have no exertional symptoms.  Jason Compton denies lower extremity edema, orthopnea or PND.  Jason Compton has been using the CPAP regularly for a year.  Jason Compton notes that Jason Compton sleeps more soundly and has less nocturia on the CPAP machine.  BP has been typically running around 130/65.  Since his last appointment Jason Compton increased atorvastatin to 80mg .  Jason Compton developed aching in his hips and arms.  Jason Compton stopped taking the atorvastatin and his hips got better but Jason Compton continued to have pain in his deltoid muscles.   Jason Compton followed up with Jason Compton and started on Zetia.  Jason Compton slowly has been titrating atorvastatin. Jason Compton is now taking it four times per week.  His shoulders are slowly improving.  Jason Compton still gets some dull aching in the shoulders at times.  Otherwise Jason Compton has been doing okay.  Jason Compton had an episode where Jason Compton got very upset about some damage to his floors.  Jason Compton got angry and then suddenly couldn't breathe.  Jason Compton was able to breathe in but couldn't breathe out.  There was no associated chest pain or pressure.  Jason Compton called EMS and his blood pressure was in the [id number] systolic.  They did an  EKG that showed poor R wave progression and possible prior inferior infarct.  Jason Compton did not go to the emergency department at that time.  Jason Compton denies any lower extremity edema, orthopnea, or PND.  Since then Jason Compton has been riding an exercise bike 3-4 times per week for about 20 minutes and feels fine.   Past Medical History:  Diagnosis Date   Exogenous obesity    GERD (gastroesophageal reflux disease)    History of hiatal hernia    Hyperlipidemia    Hypertension    PVC's (premature ventricular contractions)    Skin cancer     Past Surgical History:  Procedure Laterality Date   CARDIOVASCULAR STRESS TEST  04/18/2009   NORMAL   CATARACT EXTRACTION       Current Outpatient Medications  Medication Sig Dispense Refill   amLODipine (NORVASC) 5 MG tablet TAKE 1 TABLET BY MOUTH EVERY DAY 90 tablet 3   aspirin 81 MG tablet Take 81 mg by mouth daily.       cholecalciferol (VITAMIN D) 1000 UNITS tablet Take 1,000 Units by mouth daily.      cycloSPORINE (RESTASIS) 0.05 % ophthalmic emulsion Place 1 drop into both eyes 2 (two) times daily.     ezetimibe (ZETIA) 10 MG tablet Take 1 tablet (10 mg total) by mouth daily. 30 tablet 3   Multiple Vitamin (MULTIVITAMIN)  tablet Take 1 tablet by mouth daily.       omeprazole (PRILOSEC) 20 MG capsule Take 20 mg by mouth daily.      OVER THE COUNTER MEDICATION hydroEyes 2 tablets twice a day     vitamin C (ASCORBIC ACID) 500 MG tablet Take 500 mg by mouth daily.       naproxen (NAPROSYN) 500 MG tablet Take 500 mg by mouth 2 (two) times daily with a meal.     rosuvastatin (CRESTOR) 20 MG tablet 1 TABLET BY MOUTH 3 TIMES A WEEK OR AS DIRECTED 60 tablet 3   No current facility-administered medications for this visit.     Allergies:   Altace [ramipril]    Social History:  The patient  reports that Jason Compton has never smoked. Jason Compton has never used smokeless tobacco. Jason Compton reports current alcohol use. Jason Compton reports that Jason Compton does not use drugs.   Family  History:  The patient's family history includes Cancer in his father; Hypertension in his brother; Osteoporosis in his mother.    ROS:  Please see the history of present illness.   Otherwise, review of systems are positive for none.   All other systems are reviewed and negative.    PHYSICAL EXAM: VS:  BP 140/74    Pulse 77    Temp (!) 97.2 F (36.2 C)    Ht 5\' 8"  (1.727 m)    Wt 214 lb 3.2 oz (97.2 kg)    SpO2 95%    BMI 32.57 kg/m  , BMI Body mass index is 32.57 kg/m. GENERAL:  Well appearing HEENT: Pupils equal round and reactive, fundi not visualized, oral mucosa unremarkable NECK:  No jugular venous distention, waveform within normal limits, carotid upstroke brisk and symmetric, no bruits LUNGS:  Clear to auscultation bilaterally HEART:  RRR.  PMI not displaced or sustained,S1 and S2 within normal limits, no S3, no S4, no clicks, no rubs, no murmurs ABD:  Flat, positive bowel sounds normal in frequency in pitch, no bruits, no rebound, no guarding, no midline pulsatile mass, no hepatomegaly, no splenomegaly EXT:  2 plus pulses throughout, no edema, no cyanosis no clubbing SKIN:  No rashes no nodules NEURO:  Cranial nerves II through XII grossly intact, motor grossly intact throughout PSYCH:  Cognitively intact, oriented to person place and time   EKG:  EKG is ordered today. The ekg ordered 03/30/16 demonstrates sinus rhythm rate 71 bpm.  Cannot rule out prior inferior infarct.  Left axis deviation.  Unchanged from prior.   11/19/16: Sinus rhythm. Rate 72 bpm. Low voltage limb leads. Prior inferior infarct 05/08/2018: Sinus rhythm.  LAFB.  Cannot rule out prior inferior infarct.  Low voltage.  Recent Labs: 11/07/2018: Hemoglobin 16.1; Platelets 176.0 03/14/2019: ALT 15; BUN 11; Creatinine, Ser 0.83; Potassium 5.2; Sodium 139    Lipid Panel    Component Value Date/Time   CHOL 147 05/17/2019 1021   TRIG 46 05/17/2019 1021   HDL 72 05/17/2019 1021   CHOLHDL 3.4 03/14/2019 1004    CHOLHDL 3 11/07/2018 0940   VLDL 15.6 11/07/2018 0940   LDLCALC 65 05/17/2019 1021   LDLDIRECT 189.7 12/10/2011 0901      Wt Readings from Last 3 Encounters:  05/25/19 214 lb 3.2 oz (97.2 kg)  05/03/19 214 lb (97.1 kg)  03/22/19 213 lb 12.8 oz (97 kg)      ASSESSMENT AND PLAN:  # Shortness of breath: Jason Compton has an episode of shortness of breath that occurred in the setting  of getting upset.  Jason Compton has been okay with exercise since then, though it does not seem Jason Compton really pushes himself very hard.  Given that we know Jason Compton has some coronary calcification we will get a stress test to evaluate for ischemia.  Jason Compton prefers to walk on the treadmill.  We will get an exercise Myoview.    # Hypertension:  Blood pressure was initially elevated but better on repeat. Continue amlodipine.   # Hyperlipidemia.  LDL 93 10/2017 Jason Compton did not tolerate higher doses of atorvastatin.  Jason Compton continues to have some mild discomfort in his shoulders on reduced doses of atorvastatin and Zetia.  We will try switching to rosuvastatin.  Continue Zetia.  Jason Compton will start rosuvastatin 20 mg 3 times weekly.  Jason Compton will have repeat lipids and a CMP in 3 months.  If his LDL is not less than 70 or if Jason Compton has residual symptoms we will consider PCSK9 inhibitor.  # Coronary calcification:  Continue aspirin 81 mg daily.  Lipid management and exercise Myoview as above.   Current medicines are reviewed at length with the patient today.  The patient does not have concerns regarding medicines.  The following changes have been made:  Switch atorvastatin to rosuvastatin  Labs/ tests ordered today include:   Orders Placed This Encounter  Procedures   Lipid panel   Comprehensive metabolic panel   MYOCARDIAL PERFUSION IMAGING     Disposition:   FU with Christion Leonhard C. Oval Linsey, MD, [facility] in36 months.     Signed, Ronalee Scheunemann C. Oval Linsey, MD, [facility]  05/25/2019 10:41 AM    [place]

## 2019-05-30 ENCOUNTER — Encounter: Payer: Self-pay | Admitting: *Deleted

## 2019-06-07 ENCOUNTER — Telehealth (HOSPITAL_COMMUNITY): Payer: Self-pay

## 2019-06-07 NOTE — Telephone Encounter (Signed)
Encounter complete. 

## 2019-06-08 ENCOUNTER — Telehealth (HOSPITAL_COMMUNITY): Payer: Self-pay | Admitting: *Deleted

## 2019-06-08 ENCOUNTER — Other Ambulatory Visit (HOSPITAL_COMMUNITY)
Admission: RE | Admit: 2019-06-08 | Discharge: 2019-06-08 | Disposition: A | Discharge status: Home / Self Care | Payer: Medicare Other | Source: Ambulatory Visit | Attending: Cardiovascular Disease | Admitting: Cardiovascular Disease

## 2019-06-08 DIAGNOSIS — Z20828 Contact with and (suspected) exposure to other viral communicable diseases: Secondary | ICD-10-CM | POA: Insufficient documentation

## 2019-06-08 DIAGNOSIS — Z01812 Encounter for preprocedural laboratory examination: Secondary | ICD-10-CM | POA: Diagnosis not present

## 2019-06-08 NOTE — Telephone Encounter (Signed)
Close encounter 

## 2019-06-10 LAB — NOVEL CORONAVIRUS, NAA (HOSP ORDER, SEND-OUT TO REF LAB; TAT 18-24 HRS): SARS-CoV-2, NAA: NOT DETECTED

## 2019-06-12 ENCOUNTER — Other Ambulatory Visit: Payer: Self-pay

## 2019-06-12 ENCOUNTER — Ambulatory Visit (HOSPITAL_COMMUNITY)
Admission: RE | Admit: 2019-06-12 | Discharge: 2019-06-12 | Disposition: A | Discharge status: Home / Self Care | Payer: Medicare Other | Source: Ambulatory Visit | Attending: Cardiology | Admitting: Cardiology

## 2019-06-12 DIAGNOSIS — E7800 Pure hypercholesterolemia, unspecified: Secondary | ICD-10-CM | POA: Insufficient documentation

## 2019-06-12 DIAGNOSIS — R0602 Shortness of breath: Secondary | ICD-10-CM | POA: Diagnosis not present

## 2019-06-12 DIAGNOSIS — I10 Essential (primary) hypertension: Secondary | ICD-10-CM | POA: Insufficient documentation

## 2019-06-12 LAB — MYOCARDIAL PERFUSION IMAGING
Estimated workload: 6.9 METS
Exercise duration (min): 6 min
Exercise duration (sec): 0 s
LV dias vol: 103 mL (ref 62–150)
LV sys vol: 39 mL
MPHR: 139 {beats}/min
Peak HR: 129 {beats}/min
Percent HR: 92 %
RPE: 18
Rest HR: 62 {beats}/min
SDS: 4
SRS: 2
SSS: 6
TID: 1.06

## 2019-06-12 MED ORDER — TECHNETIUM TC 99M TETROFOSMIN IV KIT
10.1000 | PACK | Freq: Once | INTRAVENOUS | Status: AC | PRN
  Administered 2019-06-12: 10.1 via INTRAVENOUS
  Filled 2019-06-12: qty 11

## 2019-06-12 MED ORDER — TECHNETIUM TC 99M TETROFOSMIN IV KIT
30.2000 | PACK | Freq: Once | INTRAVENOUS | Status: AC | PRN
  Administered 2019-06-12: 30.2 via INTRAVENOUS
  Filled 2019-06-12: qty 31

## 2019-06-13 ENCOUNTER — Other Ambulatory Visit: Payer: Self-pay | Admitting: Cardiovascular Disease

## 2019-06-21 DIAGNOSIS — L438 Other lichen planus: Secondary | ICD-10-CM | POA: Diagnosis not present

## 2019-06-21 DIAGNOSIS — Z85828 Personal history of other malignant neoplasm of skin: Secondary | ICD-10-CM | POA: Diagnosis not present

## 2019-06-21 DIAGNOSIS — H0015 Chalazion left lower eyelid: Secondary | ICD-10-CM | POA: Diagnosis not present

## 2019-06-21 DIAGNOSIS — L570 Actinic keratosis: Secondary | ICD-10-CM | POA: Diagnosis not present

## 2019-06-21 DIAGNOSIS — D1801 Hemangioma of skin and subcutaneous tissue: Secondary | ICD-10-CM | POA: Diagnosis not present

## 2019-06-21 DIAGNOSIS — L821 Other seborrheic keratosis: Secondary | ICD-10-CM | POA: Diagnosis not present

## 2019-06-21 DIAGNOSIS — H04123 Dry eye syndrome of bilateral lacrimal glands: Secondary | ICD-10-CM | POA: Diagnosis not present

## 2019-06-29 ENCOUNTER — Other Ambulatory Visit: Payer: Self-pay | Admitting: Cardiovascular Disease

## 2019-07-18 DIAGNOSIS — M4316 Spondylolisthesis, lumbar region: Secondary | ICD-10-CM | POA: Diagnosis not present

## 2019-07-18 DIAGNOSIS — M545 Low back pain: Secondary | ICD-10-CM | POA: Diagnosis not present

## 2019-07-18 DIAGNOSIS — M47896 Other spondylosis, lumbar region: Secondary | ICD-10-CM | POA: Diagnosis not present

## 2019-07-18 DIAGNOSIS — M47816 Spondylosis without myelopathy or radiculopathy, lumbar region: Secondary | ICD-10-CM | POA: Diagnosis not present

## 2019-07-26 DIAGNOSIS — H0015 Chalazion left lower eyelid: Secondary | ICD-10-CM | POA: Diagnosis not present

## 2019-07-26 DIAGNOSIS — H04123 Dry eye syndrome of bilateral lacrimal glands: Secondary | ICD-10-CM | POA: Diagnosis not present

## 2019-08-02 DIAGNOSIS — M545 Low back pain: Secondary | ICD-10-CM | POA: Diagnosis not present

## 2019-08-02 DIAGNOSIS — M47896 Other spondylosis, lumbar region: Secondary | ICD-10-CM | POA: Diagnosis not present

## 2019-09-02 ENCOUNTER — Ambulatory Visit: Payer: Medicare Other | Attending: Internal Medicine

## 2019-09-02 ENCOUNTER — Other Ambulatory Visit: Payer: Self-pay

## 2019-09-02 DIAGNOSIS — Z23 Encounter for immunization: Secondary | ICD-10-CM | POA: Insufficient documentation

## 2019-09-02 NOTE — Progress Notes (Signed)
   Covid-19 Vaccination Clinic  Name:  Jason Compton    MRN: [id number] DOB: [birth year]  09/02/2019  Jason Compton was observed post Covid-19 immunization for 15 minutes without incidence. He was provided with Vaccine Information Sheet and instruction to access the V-Safe system.   Jason Compton was instructed to call 911 with any severe reactions post vaccine: Marland Kitchen Difficulty breathing  . Swelling of your face and throat  . A fast heartbeat  . A bad rash all over your body  . Dizziness and weakness    Immunizations Administered    Name Date Dose VIS Date Route   Pfizer COVID-19 Vaccine 09/02/2019 11:56 AM 0.3 mL 08/03/2019 Intramuscular   Manufacturer: [employer], [employer]   Lot: [id number]   [place]: [id number]

## 2019-09-23 ENCOUNTER — Ambulatory Visit: Payer: Medicare Other | Attending: Internal Medicine

## 2019-09-23 DIAGNOSIS — Z23 Encounter for immunization: Secondary | ICD-10-CM | POA: Insufficient documentation

## 2019-09-23 NOTE — Progress Notes (Signed)
   Covid-19 Vaccination Clinic  Name:  Jason Compton    MRN: [id number] DOB: 25-Mar-1936  09/23/2019  Mr. Jason Compton was observed post Covid-19 immunization for 15 minutes without incidence. He was provided with Vaccine Information Sheet and instruction to access the V-Safe system.   Mr. Jason Compton was instructed to call 911 with any severe reactions post vaccine: Marland Kitchen Difficulty breathing  . Swelling of your face and throat  . A fast heartbeat  . A bad rash all over your body  . Dizziness and weakness    Immunizations Administered    Name Date Dose VIS Date Route   Pfizer COVID-19 Vaccine 09/23/2019 10:44 AM 0.3 mL 08/03/2019 Intramuscular   Manufacturer: [employer]   Lot: [id number]   [place]: [id number]

## 2019-09-24 ENCOUNTER — Other Ambulatory Visit: Payer: Self-pay | Admitting: Cardiovascular Disease

## 2019-10-30 NOTE — Progress Notes (Signed)
Subjective:    Patient ID: Jason Compton, male    DOB: 1936/03/17, 84 y.o.   MRN: [id number]  HPI The patient is here for follow up of their chronic medical problems, including CAD, hypertension, hyperlipidemia, prediabetes, osa.  He is taking all of his medications as prescribed.    He is not exercising regularly.   He has been dealing with some back/hip issues and will be having an injection soon and may be able to resume exercise after that.Marland Kitchen    He is not always compliant with a low salt, sugar diet.  He does consume more salt than he should.  He has been eating ice cream on a regular basis.  Skin irritation on left side.  It is a dime side red spot that itches.  Tried hydrocortisone and it helped a little.  He is not exercising regularly.    He is on doxycycline for dry eyes.   Medications and allergies reviewed with patient and updated if appropriate.  Patient Active Problem List   Diagnosis Date Noted  . Diarrhea 10/10/2018  . Cough 10/10/2018  . Herpes simplex 07/25/2018  . Allergic rhinitis 07/25/2018  . OSA on CPAP 05/08/2018  . History of adenomatous polyp of colon 02/15/2018  . Internal hemorrhoids 02/15/2018  . Cervical spondylosis without myelopathy 10/13/2017  . Aortic atherosclerosis ([place]) 04/28/2017  . Coronary artery disease 04/28/2017  . Prediabetes 11/02/2016  . Diverticulosis 07/30/2013  . Hard of hearing 07/30/2013  . Hypertension 03/08/2011  . Hypercholesterolemia 03/08/2011  . PSA elevation 03/08/2011    Current Outpatient Medications on File Prior to Visit  Medication Sig Dispense Refill  . amLODipine (NORVASC) 5 MG tablet TAKE 1 TABLET BY MOUTH EVERY DAY 90 tablet 3  . aspirin 81 MG tablet Take 81 mg by mouth daily.      . cholecalciferol (VITAMIN D) 1000 UNITS tablet Take 1,000 Units by mouth daily.     . cycloSPORINE (RESTASIS) 0.05 % ophthalmic emulsion Place 1 drop into both eyes 2 (two) times daily.    Marland Kitchen doxycycline (VIBRA-TABS)  100 MG tablet     . ezetimibe (ZETIA) 10 MG tablet TAKE 1 TABLET BY MOUTH  DAILY 90 tablet 2  . Multiple Vitamin (MULTIVITAMIN) tablet Take 1 tablet by mouth daily.      Marland Kitchen omeprazole (PRILOSEC) 20 MG capsule Take 20 mg by mouth daily.     Marland Kitchen OVER THE COUNTER MEDICATION hydroEyes 2 tablets twice a day    . rosuvastatin (CRESTOR) 20 MG tablet 1 TABLET BY MOUTH 3 TIMES A WEEK OR AS DIRECTED 60 tablet 3  . vitamin C (ASCORBIC ACID) 500 MG tablet Take 500 mg by mouth daily.      . naproxen (NAPROSYN) 500 MG tablet Take 500 mg by mouth 2 (two) times daily with a meal.     No current facility-administered medications on file prior to visit.    Past Medical History:  Diagnosis Date  . Exogenous obesity   . GERD (gastroesophageal reflux disease)   . History of hiatal hernia   . Hyperlipidemia   . Hypertension   . PVC's (premature ventricular contractions)   . Skin cancer     Past Surgical History:  Procedure Laterality Date  . CARDIOVASCULAR STRESS TEST  04/18/2009   NORMAL  . CATARACT EXTRACTION      Social History   Socioeconomic History  . Marital status: Married    Spouse name: Not on file  . Number of children:  Not on file  . Years of education: Not on file  . Highest education level: Not on file  Occupational History  . Not on file  Tobacco Use  . Smoking status: Never Smoker  . Smokeless tobacco: Never Used  Substance and Sexual Activity  . Alcohol use: Yes    Alcohol/week: 0.0 standard drinks    Comment: wine  . Drug use: No  . Sexual activity: Not on file  Other Topics Concern  . Not on file  Social History Narrative  . Not on file   Social Determinants of Health   Financial Resource Strain:   . Difficulty of Paying Living Expenses: Not on file  Food Insecurity:   . Worried About Charity fundraiser in the Last Year: Not on file  . Ran Out of Food in the Last Year: Not on file  Transportation Needs:   . Lack of Transportation (Medical): Not on file  . Lack  of Transportation (Non-Medical): Not on file  Physical Activity:   . Days of Exercise per Week: Not on file  . Minutes of Exercise per Session: Not on file  Stress:   . Feeling of Stress : Not on file  Social Connections:   . Frequency of Communication with Friends and Family: Not on file  . Frequency of Social Gatherings with Friends and Family: Not on file  . Attends Religious Services: Not on file  . Active Member of Clubs or Organizations: Not on file  . Attends Archivist Meetings: Not on file  . Marital Status: Not on file    Family History  Problem Relation Age of Onset  . Osteoporosis Mother   . Cancer Father        skin  . Hypertension Brother     Review of Systems  Constitutional: Negative for chills and fever.  Respiratory: Negative for cough, shortness of breath and wheezing.   Cardiovascular: Positive for leg swelling (mild). Negative for chest pain and palpitations.  Musculoskeletal: Positive for arthralgias.  Neurological: Negative for dizziness, light-headedness and headaches.       Objective:   Vitals:   10/31/19 0913  BP: (!) 158/78  Pulse: 65  Temp: 98.4 F (36.9 C)  SpO2: 99%   BP Readings from Last 3 Encounters:  10/31/19 (!) 158/78  05/25/19 140/74  05/03/19 124/70   Wt Readings from Last 3 Encounters:  10/31/19 209 lb 6.4 oz (95 kg)  06/12/19 214 lb (97.1 kg)  05/25/19 214 lb 3.2 oz (97.2 kg)   Body mass index is 31.84 kg/m.   Physical Exam    Constitutional: Appears well-developed and well-nourished. No distress.  HENT:  Head: Normocephalic and atraumatic.  Neck: Neck supple. No tracheal deviation present. No thyromegaly present.  No cervical lymphadenopathy Cardiovascular: Normal rate, regular rhythm and normal heart sounds.   No murmur heard. No carotid bruit .  No edema Pulmonary/Chest: Effort normal and breath sounds normal. No respiratory distress. No has no wheezes. No rales.  Skin: Skin is warm and dry. Not  diaphoretic.  Psychiatric: Normal mood and affect. Behavior is normal.      Assessment & Plan:    See Problem List for Assessment and Plan of chronic medical problems.    This visit occurred during the SARS-CoV-2 public health emergency.  Safety protocols were in place, including screening questions prior to the visit, additional usage of staff PPE, and extensive cleaning of exam room while observing appropriate contact time as indicated for disinfecting  solutions.

## 2019-10-30 NOTE — Patient Instructions (Addendum)
  Blood work was ordered.   ° ° °Medications reviewed and updated.  Changes include :   none ° ° ° °Please followup in 6 months ° ° °

## 2019-10-31 ENCOUNTER — Other Ambulatory Visit: Payer: Self-pay

## 2019-10-31 ENCOUNTER — Telehealth: Payer: Self-pay

## 2019-10-31 ENCOUNTER — Ambulatory Visit (INDEPENDENT_AMBULATORY_CARE_PROVIDER_SITE_OTHER): Payer: Medicare Other | Admitting: Internal Medicine

## 2019-10-31 ENCOUNTER — Encounter: Payer: Self-pay | Admitting: Internal Medicine

## 2019-10-31 VITALS — BP 158/78 | HR 65 | Temp 98.4°F | Ht 68.0 in | Wt 209.4 lb

## 2019-10-31 DIAGNOSIS — R7303 Prediabetes: Secondary | ICD-10-CM

## 2019-10-31 DIAGNOSIS — E7800 Pure hypercholesterolemia, unspecified: Secondary | ICD-10-CM | POA: Diagnosis not present

## 2019-10-31 DIAGNOSIS — G4733 Obstructive sleep apnea (adult) (pediatric): Secondary | ICD-10-CM | POA: Diagnosis not present

## 2019-10-31 DIAGNOSIS — I2510 Atherosclerotic heart disease of native coronary artery without angina pectoris: Secondary | ICD-10-CM | POA: Diagnosis not present

## 2019-10-31 DIAGNOSIS — I10 Essential (primary) hypertension: Secondary | ICD-10-CM | POA: Diagnosis not present

## 2019-10-31 DIAGNOSIS — Z9989 Dependence on other enabling machines and devices: Secondary | ICD-10-CM

## 2019-10-31 LAB — CBC WITH DIFFERENTIAL/PLATELET
Basophils Absolute: 0 10*3/uL (ref 0.0–0.1)
Basophils Relative: 0.9 % (ref 0.0–3.0)
Eosinophils Absolute: 0.1 10*3/uL (ref 0.0–0.7)
Eosinophils Relative: 1.5 % (ref 0.0–5.0)
HCT: 45.1 % (ref 39.0–52.0)
Hemoglobin: 15 g/dL (ref 13.0–17.0)
Lymphocytes Relative: 33 % (ref 12.0–46.0)
Lymphs Abs: 1.4 10*3/uL (ref 0.7–4.0)
MCHC: 33.2 g/dL (ref 30.0–36.0)
MCV: 100.2 fl — ABNORMAL HIGH (ref 78.0–100.0)
Monocytes Absolute: 0.4 10*3/uL (ref 0.1–1.0)
Monocytes Relative: 9.5 % (ref 3.0–12.0)
Neutro Abs: 2.4 10*3/uL (ref 1.4–7.7)
Neutrophils Relative %: 55.1 % (ref 43.0–77.0)
Platelets: 183 10*3/uL (ref 150.0–400.0)
RBC: 4.5 Mil/uL (ref 4.22–5.81)
RDW: 13.3 % (ref 11.5–15.5)
WBC: 4.3 10*3/uL (ref 4.0–10.5)

## 2019-10-31 LAB — LIPID PANEL
Cholesterol: 155 mg/dL (ref 0–200)
HDL: 68.7 mg/dL (ref >39.00)
LDL Cholesterol: 76 mg/dL (ref 0–99)
NonHDL: 86.66
Total CHOL/HDL Ratio: 2
Triglycerides: 53 mg/dL (ref 0.0–149.0)
VLDL: 10.6 mg/dL (ref 0.0–40.0)

## 2019-10-31 LAB — COMPREHENSIVE METABOLIC PANEL
ALT: 19 U/L (ref 0–53)
AST: 16 U/L (ref 0–37)
Albumin: 3.8 g/dL (ref 3.5–5.2)
Alkaline Phosphatase: 54 U/L (ref 39–117)
BUN: 14 mg/dL (ref 6–23)
CO2: 30 mEq/L (ref 19–32)
Calcium: 9 mg/dL (ref 8.4–10.5)
Chloride: 105 mEq/L (ref 96–112)
Creatinine, Ser: 0.82 mg/dL (ref 0.40–1.50)
GFR: 90.06 mL/min (ref >60.00)
Glucose, Bld: 110 mg/dL — ABNORMAL HIGH (ref 70–99)
Potassium: 4.5 mEq/L (ref 3.5–5.1)
Sodium: 138 mEq/L (ref 135–145)
Total Bilirubin: 0.7 mg/dL (ref 0.2–1.2)
Total Protein: 6.4 g/dL (ref 6.0–8.3)

## 2019-10-31 LAB — HEMOGLOBIN A1C: Hgb A1c MFr Bld: 5.5 % (ref 4.6–6.5)

## 2019-10-31 MED ORDER — TRIAMCINOLONE ACETONIDE 0.1 % EX CREA: 1.0000 "application " | TOPICAL_CREAM | Freq: Two times a day (BID) | CUTANEOUS | 0 refills | Status: DC

## 2019-10-31 NOTE — Telephone Encounter (Signed)
New message   Wife calls, the patient was seen in the office today C/o  spot on the shoulder that was discussed with Dr. Quay Burow, that itches a lot.   Wanted to know if Dr. Quay Burow could prescribe something stronger.     [place], [address] AT [place]

## 2019-10-31 NOTE — Assessment & Plan Note (Signed)
Chronic No concerning chest pain, palpitations or shortness of breath Continue aspirin, Crestor/Zetia Following with cardiology CBC, CMP, lipid

## 2019-10-31 NOTE — Telephone Encounter (Signed)
I did send a cream-triamcinolone to their pharmacy.

## 2019-10-31 NOTE — Assessment & Plan Note (Signed)
Chronic He is using his CPAP nightly

## 2019-10-31 NOTE — Telephone Encounter (Signed)
Husband is aware.

## 2019-10-31 NOTE — Assessment & Plan Note (Addendum)
Chronic BP high today, ? Controlled-blood pressure was better the last couple of times it was checked at a doctor's office Monitor BP at home for two weeks at home and make sure it is well controlled Current regimen effective and well tolerated Continue current medications at current doses Stressed decreasing salt intake Stressed regular exercise once his back is better cmp

## 2019-10-31 NOTE — Assessment & Plan Note (Addendum)
Chronic Check lipid panel  Continue crestor TIW, zetia Regular exercise and healthy diet encouraged

## 2019-10-31 NOTE — Assessment & Plan Note (Signed)
Chronic Check a1c Low sugar / carb diet Stressed regular exercise  

## 2019-11-01 ENCOUNTER — Encounter: Payer: Self-pay | Admitting: Internal Medicine

## 2019-11-06 ENCOUNTER — Telehealth: Payer: Self-pay | Admitting: Cardiovascular Disease

## 2019-11-06 NOTE — Telephone Encounter (Signed)
New Message:     Pt wants to know if he need a Sleep  Clinic visit?

## 2019-11-12 DIAGNOSIS — G4733 Obstructive sleep apnea (adult) (pediatric): Secondary | ICD-10-CM | POA: Diagnosis not present

## 2019-11-28 ENCOUNTER — Encounter: Payer: Self-pay | Admitting: Cardiovascular Disease

## 2019-11-28 ENCOUNTER — Ambulatory Visit (INDEPENDENT_AMBULATORY_CARE_PROVIDER_SITE_OTHER): Payer: Medicare Other | Admitting: Cardiovascular Disease

## 2019-11-28 ENCOUNTER — Other Ambulatory Visit: Payer: Self-pay

## 2019-11-28 VITALS — BP 144/82 | HR 63 | Ht 68.0 in | Wt 213.0 lb

## 2019-11-28 DIAGNOSIS — I10 Essential (primary) hypertension: Secondary | ICD-10-CM | POA: Diagnosis not present

## 2019-11-28 DIAGNOSIS — I2510 Atherosclerotic heart disease of native coronary artery without angina pectoris: Secondary | ICD-10-CM | POA: Diagnosis not present

## 2019-11-28 DIAGNOSIS — Z9989 Dependence on other enabling machines and devices: Secondary | ICD-10-CM

## 2019-11-28 DIAGNOSIS — I700 Atherosclerosis of aorta: Secondary | ICD-10-CM | POA: Diagnosis not present

## 2019-11-28 DIAGNOSIS — G4733 Obstructive sleep apnea (adult) (pediatric): Secondary | ICD-10-CM | POA: Diagnosis not present

## 2019-11-28 MED ORDER — AMLODIPINE BESYLATE 10 MG PO TABS: 10.0000 mg | ORAL_TABLET | Freq: Every day | ORAL | 3 refills | Status: DC

## 2019-11-28 NOTE — Patient Instructions (Signed)
Medication Instructions:  INCREASE YOUR AMLODIPINE TO 10 MG DAILY  *If you need a refill on your cardiac medications before your next appointment, please call your pharmacy*  Lab Work: NONE   Testing/Procedures: NONE  Follow-Up: Your physician recommends that you schedule a follow-up appointment in: PHARM D FOR BLOOD PRESSURE IN 2 MONTHS  Your physician wants you to follow-up in: [facility] OR PA/NP IN 6 MONTHS You will receive a reminder letter in the mail two months in advance. If you don't receive a letter, please call our office to schedule the follow-up appointment.

## 2019-11-28 NOTE — Progress Notes (Signed)
Cardiology Office Note   Date:  11/28/2019   ID:  Jason Compton, DOB [birth year], MRN [id number]  PCP:  Binnie Rail, MD  Cardiologist:   Skeet Latch, MD   No chief complaint on file.    History of Present Illness: Jason Compton is a 84 y.o. male with non-obstructive CAD, hypertension, hyperlipidemia, OSA on CPAP,and Celiac disease who presents for follow up. Jason Compton was previously a patient of [name]. He continues to use his CPAP regularly.  He notes that he sleeps more soundly and has less nocturia on the CPAP machine. Jason Compton increased atorvastatin to 80mg .  He developed aching in his hips and arms.  He followed up with Jason Compton and started on Zetia.  He slowly has been titrating atorvastatin. He is now taking it four times per week. However he still had myalgias so we will switch to rosuvastatin. Since that time he has been doing well.  At his last appointment he reported increasing exertional dyspnea.  He had a Lexiscan Myoview 05/2019 that was negative for ischemia.  Since his last appointment he notices that his BP has been high lately.  It seems to fluctuate from the 120s-150s.  It is rare for it to be in the 150s. He continues to use his exercise bike several times per week and feels well. He has no exertional symptoms. He is planning to start back traveling soon now that he has his coronavirus vaccinations. His diet has been good and they are mostly cooking at home due to the pandemic.  Past Medical History:  Diagnosis Date  . Exogenous obesity   . GERD (gastroesophageal reflux disease)   . History of hiatal hernia   . Hyperlipidemia   . Hypertension   . PVC's (premature ventricular contractions)   . Skin cancer     Past Surgical History:  Procedure Laterality Date  . CARDIOVASCULAR STRESS TEST  04/18/2009   NORMAL  . CATARACT EXTRACTION       Current Outpatient Medications  Medication Sig Dispense Refill  . amLODipine (NORVASC) 10 MG  tablet Take 1 tablet (10 mg total) by mouth daily. 90 tablet 3  . aspirin 81 MG tablet Take 81 mg by mouth daily.      . cholecalciferol (VITAMIN D) 1000 UNITS tablet Take 1,000 Units by mouth daily.     . cycloSPORINE (RESTASIS) 0.05 % ophthalmic emulsion Place 1 drop into both eyes 2 (two) times daily.    Marland Kitchen doxycycline (VIBRA-TABS) 100 MG tablet     . ezetimibe (ZETIA) 10 MG tablet TAKE 1 TABLET BY MOUTH  DAILY 90 tablet 2  . Multiple Vitamin (MULTIVITAMIN) tablet Take 1 tablet by mouth daily.      Marland Kitchen omeprazole (PRILOSEC) 20 MG capsule Take 20 mg by mouth daily.     Marland Kitchen OVER THE COUNTER MEDICATION hydroEyes 2 tablets twice a day    . rosuvastatin (CRESTOR) 20 MG tablet 1 TABLET BY MOUTH 3 TIMES A WEEK OR AS DIRECTED 60 tablet 3  . triamcinolone cream (KENALOG) 0.1 % Apply 1 application topically 2 (two) times daily. 30 g 0  . vitamin C (ASCORBIC ACID) 500 MG tablet Take 500 mg by mouth daily.       No current facility-administered medications for this visit.    Allergies:   Altace [ramipril]    Social History:  The patient  reports that he has never smoked. He has never used smokeless tobacco. He reports current alcohol use.  He reports that he does not use drugs.   Family History:  The patient's family history includes Cancer in his father; Hypertension in his brother; Osteoporosis in his mother.    ROS:  Please see the history of present illness.   Otherwise, review of systems are positive for none.   All other systems are reviewed and negative.    PHYSICAL EXAM: VS:  BP (!) 144/82   Pulse 63   Ht 5\' 8"  (1.727 m)   Wt 213 lb (96.6 kg)   SpO2 99%   BMI 32.39 kg/m  , BMI Body mass index is 32.39 kg/m. GENERAL:  Well appearing HEENT: Pupils equal round and reactive, fundi not visualized, oral mucosa unremarkable NECK:  No jugular venous distention, waveform within normal limits, carotid upstroke brisk and symmetric, no bruits LUNGS:  Clear to auscultation bilaterally HEART:   RRR.  PMI not displaced or sustained,S1 and S2 within normal limits, no S3, no S4, no clicks, no rubs, no murmurs ABD:  Flat, positive bowel sounds normal in frequency in pitch, no bruits, no rebound, no guarding, no midline pulsatile mass, no hepatomegaly, no splenomegaly EXT:  2 plus pulses throughout, no edema, no cyanosis no clubbing SKIN:  No rashes no nodules NEURO:  Cranial nerves II through XII grossly intact, motor grossly intact throughout PSYCH:  Cognitively intact, oriented to person place and time    EKG:  EKG is ordered today. The ekg ordered 03/30/16 demonstrates sinus rhythm rate 71 bpm.  Cannot rule out prior inferior infarct.  Left axis deviation.  Unchanged from prior.   11/19/16: Sinus rhythm. Rate 72 bpm. Low voltage limb leads. Prior inferior infarct 05/08/2018: Sinus rhythm.  LAFB.  Cannot rule out prior inferior infarct.  Low voltage. 11/28/19: Sinus rhythm.  Rate 63 bmp.  LAD.  Low voltage.  Prior inferior infarct.    Lexiscan Myoview 05/2019:  Nuclear stress EF: 62%.  The left ventricular ejection fraction is normal (55-65%).  There was no ST segment deviation noted during stress.  No T wave inversion was noted during stress.  Blood pressure demonstrated a hypertensive response to exercise.  The study is normal.  This is a low risk study.  Recent Labs: 10/31/2019: ALT 19; BUN 14; Creatinine, Ser 0.82; Hemoglobin 15.0; Platelets 183.0; Potassium 4.5; Sodium 138    Lipid Panel    Component Value Date/Time   CHOL 155 10/31/2019 1028   CHOL 147 05/17/2019 1021   TRIG 53.0 10/31/2019 1028   HDL 68.70 10/31/2019 1028   HDL 72 05/17/2019 1021   CHOLHDL 2 10/31/2019 1028   VLDL 10.6 10/31/2019 1028   LDLCALC 76 10/31/2019 1028   LDLCALC 65 05/17/2019 1021   LDLDIRECT 189.7 12/10/2011 0901      Wt Readings from Last 3 Encounters:  11/28/19 213 lb (96.6 kg)  10/31/19 209 lb 6.4 oz (95 kg)  06/12/19 214 lb (97.1 kg)      ASSESSMENT AND PLAN:  #  Shortness of breath: Resolved.  Lexiscan Myoview was within normal limits 05/2019.  # Hypertension:  Blood pressure has been elevated.  We will increase amlodipine and keep tracking.     # Hyperlipidemia.  LDL much better controlled and he is tolerating rosuvastatin.  # Coronary calcification:  Continue aspirin and rosuvastatin.   Current medicines are reviewed at length with the patient today.  The patient does not have concerns regarding medicines.  The following changes have been made: Increase amlodipine  Labs/ tests ordered today include:  Orders Placed This Encounter  Procedures  . EKG 12-Lead     Disposition:   FU with Jason Lavery C. Oval Linsey, MD, [facility] in 6 months.   2 months with PharmD.       Signed, Jason Thoma C. Oval Linsey, MD, [facility]  11/28/2019 5:40 PM    [facility]

## 2019-12-06 ENCOUNTER — Ambulatory Visit: Payer: Medicare Other | Admitting: Cardiovascular Disease

## 2019-12-06 ENCOUNTER — Encounter: Payer: Self-pay | Admitting: Cardiovascular Disease

## 2019-12-06 ENCOUNTER — Other Ambulatory Visit: Payer: Self-pay

## 2019-12-06 VITALS — BP 140/73 | HR 74 | Temp 96.8°F | Ht 68.0 in | Wt 211.6 lb

## 2019-12-06 DIAGNOSIS — E7800 Pure hypercholesterolemia, unspecified: Secondary | ICD-10-CM | POA: Diagnosis not present

## 2019-12-06 DIAGNOSIS — G4733 Obstructive sleep apnea (adult) (pediatric): Secondary | ICD-10-CM

## 2019-12-06 DIAGNOSIS — Z9989 Dependence on other enabling machines and devices: Secondary | ICD-10-CM

## 2019-12-06 DIAGNOSIS — I10 Essential (primary) hypertension: Secondary | ICD-10-CM

## 2019-12-06 DIAGNOSIS — I2510 Atherosclerotic heart disease of native coronary artery without angina pectoris: Secondary | ICD-10-CM | POA: Diagnosis not present

## 2019-12-06 NOTE — Patient Instructions (Signed)

## 2019-12-06 NOTE — Progress Notes (Signed)
Cardiology Office Note    Date:  12/08/2019   ID:  Jason Compton, DOB 1935-10-14, MRN [id number]  PCP:  Binnie Rail, MD  Cardiologist:  Shelva Majestic, MD (sleep); Dr. Oval Linsey  History of Present Illness:  Jason Compton is a 84 y.o. male who presents to the office today for one-year follow-up sleep evaluation following.   Jason Compton is a former patient of [name], and sees Dr. Oval Linsey for cardiology care.  He has a history of hypertension, hyperlipidemia, and silly at disease.  When seen by Dr. Oval Linsey.  His wife noted that he had very loud snoring and also was aware of witnessed apnea.  He was referred for a sleep study which was done on 06/29/2017.  This revealed severe obstructive sleep apnea with an HI of 60.2.  There was absence of REM sleep on the diagnostic portion of the study.  Was moderate oxygen desaturation to a nadir of 83%.  There was moderate snoring and moderate periodic limb movements of sleep.  He was started on CPAP therapy with a set up date of 08/26/2007 and has a ResMed air.  Sensed 10 AutoSet which initially was set at a minimum pressure of 10 and maximum pressure of 20.  He has been using nasal pillow mask.  A download from January 28 through 10/18/2017 reveals excellent compliance with 93% of days.  Use.  There was only 2 days when he was out of town that he did not use his machine.  He was averaging 7 hours of sleep per night.  AHI was 2.7.  95th percentile pressure was 13.6 with a maximum of 14.8.  I saw him for initial sleep evaluation in February 2019.  Since initiating CPAP, his nocturia has improved from 5 times per night to 0-1 time per night.  His sleep is more restorative.  He is dreaming.  He denies residual daytime sleepiness.  During that evaluation an Epworth Sleepiness Scale score was calculated since initiating CPAP and this endorsed at 8.    Epworth Sleepiness Scale: Situation   Chance of Dozing/Sleeping (0 = never , 1 = slight chance  , 2 = moderate chance , 3 = high chance )   sitting and reading 1   watching TV 2   sitting inactive in a public place 1   being a passenger in a motor vehicle for an hour or more 0   lying down in the afternoon 3   sitting and talking to someone 0   sitting quietly after lunch (no alcohol) 1   while stopped for a few minutes in traffic as the driver 0   Total Score  8   When seen at his initial sleep evaluation his compliance was excellent.  He noted marked improvement in sleep quality.  AHI was 2.7.  He was using a nasal mask.  I last saw him on October 24, 2018. He has continued to travel extensively.  He had purchased a portable ResMed air mini CPAP unit to take with his travel.  He uses the full air sense 10 AutoSet unit at home.  He admits to 100% compliance with use with combined treatment modalities.  A download for the month of December showed excellent compliance the days which he did not use his main treatment he was using his portable unit away from home.  AHI was excellent at 0.5.  He was set at a minimum pressure of 10 a maximum of 20 cm with 95th  percentile average pressure 12.9 and maximum average pressure 13.0.  He recently returned from an extensive trip to [place] and [place] for 1 month and used his portable unit from mid January through mid February.  He is sleeping well.  He denies residual daytime sleepiness.  A repeat Epworth Sleepiness Scale score was calculated in the office  and this endorsed at 7. He was using CPAP with excellent compliance. His previous nocturia of 5 times a night had been dramatically reduced to 01 time per night with therapy.  Since I last saw him he underwent a nuclear stress test in October 2020 which was low risk. Over the past year he has felt well and he denies chest pain or shortness of breath. Due to the COVID-19 pandemic he obviously has not traveled. A new download was obtained in the office today from March 16 through December 05, 2019  which showed 100% compliance with average usage around 8 hours per night. His ResMed air sense 10 AutoSet unit is set at a minimum pressure of 10 and maximum of 20. AHI is excellent at 0.7 with his 95% average pressure at 12.8 and maximum average pressure 13.7. He sleeps well. He denies residual daytime sleepiness. An Epworth Sleepiness Scale was recalculated in the office today and this endorsed at 7. She presents for 1 year evaluation.  Past Medical History:  Diagnosis Date  . Exogenous obesity   . GERD (gastroesophageal reflux disease)   . History of hiatal hernia   . Hyperlipidemia   . Hypertension   . PVC's (premature ventricular contractions)   . Skin cancer     Past Surgical History:  Procedure Laterality Date  . CARDIOVASCULAR STRESS TEST  04/18/2009   NORMAL  . CATARACT EXTRACTION      Current Medications: Outpatient Medications Prior to Visit  Medication Sig Dispense Refill  . amLODipine (NORVASC) 10 MG tablet Take 1 tablet (10 mg total) by mouth daily. 90 tablet 3  . aspirin 81 MG tablet Take 81 mg by mouth daily.      . cholecalciferol (VITAMIN D) 1000 UNITS tablet Take 1,000 Units by mouth daily.     . cycloSPORINE (RESTASIS) 0.05 % ophthalmic emulsion Place 1 drop into both eyes 2 (two) times daily.    Marland Kitchen doxycycline (VIBRA-TABS) 100 MG tablet     . ezetimibe (ZETIA) 10 MG tablet TAKE 1 TABLET BY MOUTH  DAILY 90 tablet 2  . Multiple Vitamin (MULTIVITAMIN) tablet Take 1 tablet by mouth daily.      Marland Kitchen omeprazole (PRILOSEC) 20 MG capsule Take 20 mg by mouth daily.     Marland Kitchen OVER THE COUNTER MEDICATION hydroEyes 2 tablets twice a day    . rosuvastatin (CRESTOR) 20 MG tablet 1 TABLET BY MOUTH 3 TIMES A WEEK OR AS DIRECTED 60 tablet 3  . triamcinolone cream (KENALOG) 0.1 % Apply 1 application topically 2 (two) times daily. 30 g 0  . vitamin C (ASCORBIC ACID) 500 MG tablet Take 500 mg by mouth daily.       No facility-administered medications prior to visit.     Allergies:    Altace [ramipril]   Social History   Socioeconomic History  . Marital status: Married    Spouse name: Not on file  . Number of children: Not on file  . Years of education: Not on file  . Highest education level: Not on file  Occupational History  . Not on file  Tobacco Use  . Smoking status:  Never Smoker  . Smokeless tobacco: Never Used  Substance and Sexual Activity  . Alcohol use: Yes    Alcohol/week: 0.0 standard drinks    Comment: wine  . Drug use: No  . Sexual activity: Not on file  Other Topics Concern  . Not on file  Social History Narrative  . Not on file   Social Determinants of Health   Financial Resource Strain:   . Difficulty of Paying Living Expenses:   Food Insecurity:   . Worried About Charity fundraiser in the Last Year:   . Arboriculturist in the Last Year:   Transportation Needs:   . Film/video editor (Medical):   Marland Kitchen Lack of Transportation (Non-Medical):   Physical Activity:   . Days of Exercise per Week:   . Minutes of Exercise per Session:   Stress:   . Feeling of Stress :   Social Connections:   . Frequency of Communication with Friends and Family:   . Frequency of Social Gatherings with Friends and Family:   . Attends Religious Services:   . Active Member of Clubs or Organizations:   . 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  He has an Engineer, production background.  After he retired, following 911 he worked under [employer] involved in [employer].  Family History:  The patient's family history includes Cancer in his father; Hypertension in his brother; Osteoporosis in his mother.   ROS General: Negative; No fevers, chills, or night sweats;  HEENT: Negative; No changes in vision or hearing, sinus congestion, difficulty swallowing Pulmonary: Negative; No cough, wheezing, shortness of breath, hemoptysis Cardiovascular:  Positive for hypertension and hyperlipidemia No chest pain, presyncope, syncope, palpitations GI: Negative; No nausea, vomiting, diarrhea, or abdominal pain GU: Negative; No dysuria, hematuria, or difficulty voiding Musculoskeletal: Negative; no myalgias, joint pain, or weakness Hematologic/Oncology: Negative; no easy bruising, bleeding Endocrine: Negative; no heat/cold intolerance; no diabetes Neuro: Negative; no changes in balance, headaches Skin: Negative; No rashes or skin lesions Psychiatric: Negative; No behavioral problems, depression Sleep: Positive for OSA, severe.  Since initiating CPAP therapy.  He is unaware of breakthrough  snoring, daytime sleepiness, hypersomnolence, bruxism, restless legs, hypnogognic hallucinations, no cataplexy Other comprehensive 14 point system review is negative.  PHYSICAL EXAM:   VS:  BP 140/73   Pulse 74   Temp (!) 96.8 F (36 C)   Ht '5\' 8"'  (1.727 m)   Wt 211 lb 9.6 oz (96 kg)   SpO2 96%   BMI 32.17 kg/m     Repeat blood pressure by me was 130/78  Wt Readings from Last 3 Encounters:  12/06/19 211 lb 9.6 oz (96 kg)  11/28/19 213 lb (96.6 kg)  10/31/19 209 lb 6.4 oz (95 kg)    General: Alert, oriented, no distress.  Skin: normal turgor, no rashes, warm and dry HEENT: Normocephalic, atraumatic. Pupils equal round and reactive to light; sclera anicteric; extraocular muscles intact;  Nose without nasal septal hypertrophy Mouth/Parynx benign; Mallinpatti scale 3 Neck: No JVD, no carotid bruits; normal carotid upstroke Lungs: clear to ausculatation and percussion; no wheezing or rales Chest wall: without tenderness to palpitation Heart: PMI not displaced, RRR, s1 s2 normal, 1/6 systolic murmur, no diastolic murmur, no rubs, gallops, thrills, or heaves Abdomen: soft, nontender; no hepatosplenomehaly, BS+; abdominal aorta nontender and not dilated by palpation. Back: no CVA tenderness Pulses  2+ Musculoskeletal: full range of motion, normal  strength, no joint deformities Extremities: no clubbing cyanosis or edema, Homan's sign negative  Neurologic: grossly nonfocal; Cranial nerves grossly wnl Psychologic: Normal mood and affect   Studies/Labs Reviewed:   EKG:  EKG is  ordered today.  ECG (independently read by me): Normal sinus rhythm with a premature atrial contraction. Left anterior hemiblock. Normal intervals.  March 2020 ECG (independently read by me): Sinus rhythm at 65 bpm.  Left axis deviation.  Normal intervals.  February 2019 ECG (independently read by me): Normal sinus rhythm at 70 bpm.  Q waves in 3 and aVF.  Normal intervals.  Recent Labs: BMP Latest Ref Rng & Units 10/31/2019 03/14/2019 11/07/2018  Glucose 70 - 99 mg/dL 110(H) 107(H) 107(H)  BUN 6 - 23 mg/dL '14 11 14  ' Creatinine 0.40 - 1.50 mg/dL 0.82 0.83 0.82  BUN/Creat Ratio 10 - 24 - 13 -  Sodium 135 - 145 mEq/L 138 139 140  Potassium 3.5 - 5.1 mEq/L 4.5 5.2 4.4  Chloride 96 - 112 mEq/L 105 98 104  CO2 19 - 32 mEq/L '30 24 30  ' Calcium 8.4 - 10.5 mg/dL 9.0 9.0 9.4     Hepatic Function Latest Ref Rng & Units 10/31/2019 03/14/2019 11/07/2018  Total Protein 6.0 - 8.3 g/dL 6.4 6.5 6.8  Albumin 3.5 - 5.2 g/dL 3.8 4.2 4.1  AST 0 - 37 U/L '16 17 17  ' ALT 0 - 53 U/L '19 15 22  ' Alk Phosphatase 39 - 117 U/L 54 74 74  Total Bilirubin 0.2 - 1.2 mg/dL 0.7 0.6 0.6  Bilirubin, Direct 0.0 - 0.3 mg/dL - - -    CBC Latest Ref Rng & Units 10/31/2019 11/07/2018 10/10/2018  WBC 4.0 - 10.5 K/uL 4.3 5.6 6.6  Hemoglobin 13.0 - 17.0 g/dL 15.0 16.1 15.8  Hematocrit 39.0 - 52.0 % 45.1 47.2 47.3  Platelets 150.0 - 400.0 K/uL 183.0 176.0 194.0   Lab Results  Component Value Date   MCV 100.2 (H) 10/31/2019   MCV 96.7 11/07/2018   MCV 97.2 10/10/2018   Lab Results  Component Value Date   TSH 1.12 11/09/2017   Lab Results  Component Value Date   HGBA1C 5.5 10/31/2019     BNP No results found for: BNP  ProBNP No results found for: PROBNP   Lipid Panel      Component Value Date/Time   CHOL 155 10/31/2019 1028   CHOL 147 05/17/2019 1021   TRIG 53.0 10/31/2019 1028   HDL 68.70 10/31/2019 1028   HDL 72 05/17/2019 1021   CHOLHDL 2 10/31/2019 1028   VLDL 10.6 10/31/2019 1028   LDLCALC 76 10/31/2019 1028   LDLCALC 65 05/17/2019 1021   LDLDIRECT 189.7 12/10/2011 0901    RADIOLOGY: No results found.   Additional studies/ records that were reviewed today include:  I reviewed the records of Dr. Oval Linsey.  I reviewed his sleep study and generated several  downloads on CPAP therapy. Most recent download was generated and obtained from November 06, 2019 through December 05, 2019. Epworth Sleepiness Scale score was calculated in the office today.  ASSESSMENT:    1. OSA on CPAP   2. Essential hypertension   3. Coronary artery disease involving native heart without angina pectoris, unspecified vessel or lesion type   4. Hypercholesterolemia     PLAN:  Jason Compton is a very pleasant 84 year-old gentleman who is retired and since retirement he and his wife have traveled extensively.  He has a history of hypertension and hyperlipidemia and celiac disease.  He was diagnosed with severe obstructive sleep apnea and symptoms included snoring, witnessed apnea, nocturia 4-5 times per night as well as daytime sleepiness.  Has been on CPAP therapy since August 25, 2017.  He continues to be compliant.  He has purchased a portable bed air mini unit to take with travel. Over the past year he has not been able to travel due to the COVID-19 pandemic but plans to initiate travel in the near future. He admits to 100% compliance. His download from November 06, 2019 through December 05, 2019 confirms 100% compliance. AHI is excellent at 0.7 that is 95th percentile pressure 12.8 and maximum average pressure 13.7. There is minimal leak. He denies awareness of breakthrough snoring. He has nocturia is markedly improved from pretreatment remains 0-1 time per night. He continues to  be on amlodipine 10 mg for hypertension. Blood pressure today on repeat by me was 130/78. I discussed with him new hypertensive guidelines. He had seen Dr. Oval Linsey recently. He has previously documented mild nonobstructive CAD with coronary calcification. Most recent nuclear perfusion study was felt to be low risk and negative for ischemia. Of note he did have a hypertensive response to exercise. He continues to be on Zetia and rosuvastatin 20 mg for hyperlipidemia with target LDL less than 70. Laboratory in March 2021 has shown an LDL cholesterol at 76. He has normal renal function with a creatinine of 0.82. Hemoglobin A1c was excellent at 5.5. I will not make any changes to his CPAP settings which are currently set at a minimum pressure of 10 with maximum 20. As long as he remains stable I will see him in 1 year for reevaluation or sooner as needed.   Medication Adjustments/Labs and Tests Ordered: Current medicines are reviewed at length with the patient today.  Concerns regarding medicines are outlined above.  Medication changes, Labs and Tests ordered today are listed in the Patient Instructions below. Patient Instructions  Medication Instructions:  CONTINUE WITH CURRENT MEDICATIONS. NO CHANGES.  *If you need a refill on your cardiac medications before your next appointment, please call your pharmacy*   Follow-Up: At [facility], you and your health needs are our priority.  As part of our continuing mission to provide you with exceptional heart care, we have created designated Provider Care Teams.  These Care Teams include your primary Cardiologist (physician) and Advanced Practice Providers (APPs -  Physician Assistants and Nurse Practitioners) who all work together to provide you with the care you need, when you need it.  We recommend signing up for the patient portal called "MyChart".  Sign up information is provided on this After Visit Summary.  MyChart is used to connect with patients for  Virtual Visits (Telemedicine).  Patients are able to view lab/test results, encounter notes, upcoming appointments, etc.  Non-urgent messages can be sent to your provider as well.   To learn more about what you can do with MyChart, go to [email or web address].    Your next appointment:   12 month(s)  The format for your next appointment:   In Person  Provider:   Shelva Majestic, MD        Signed, Shelva Majestic, MD  12/08/2019 [facility] [address] [place], [place], [place]  [address] Phone: [phone]

## 2019-12-08 ENCOUNTER — Encounter: Payer: Self-pay | Admitting: Cardiovascular Disease

## 2020-01-06 IMAGING — MR MR PROSTATE WO/W CM
56 series · 56 of 56 positions shown · IV contrast (20 ml multihance)
Comparison: 10/04/2013

CLINICAL DATA: Prostate cancer.  Biopsy in 6668.

EXAM:
MR PROSTATE WITHOUT AND WITH CONTRAST
TECHNIQUE: Multiplanar multisequence MRI images were obtained of the pelvis
centered about the prostate. Pre and post contrast images were
obtained.
CONTRAST:  20mL MULTIHANCE GADOBENATE DIMEGLUMINE 529 MG/ML IV SOLN
Creatinine was obtained on site at [HOSPITAL] at [HOSPITAL].
Results: Creatinine 0.8 mg/dL.

[Series 2: new loc · axial · 6.0mm · 0.88mm/px · 1 of 17 slices shown]
[im 1/17]
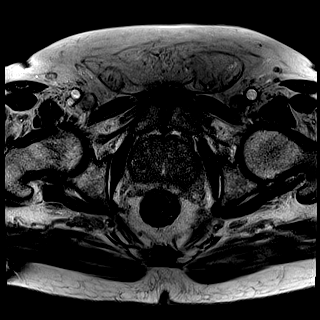

[Series 3: T1 · axial · 8.0mm · 1.06mm/px · 1 of 28 slices shown (1 of 2)]
[im 1/28]
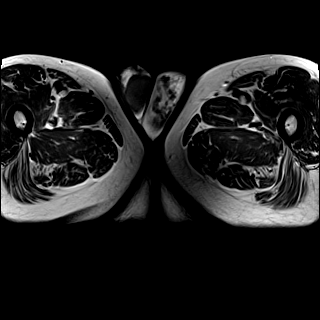

[Series 4: bSSFP fat-sat · axial · 8.0mm · 0.74mm/px · 1 of 28 slices shown]
[im 1/28]
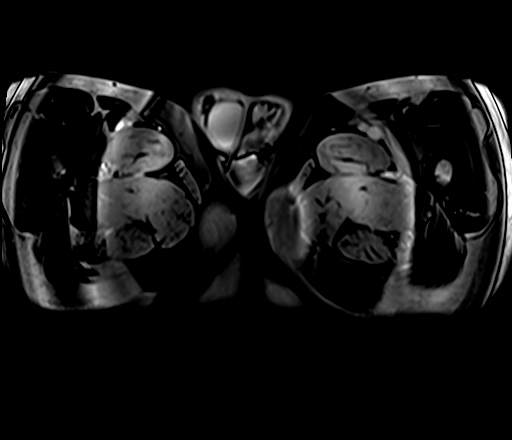

[Series 5: T2 · sagittal · 3.5mm · 0.56mm/px · 1 of 39 slices shown (1 of 4)]
[im 1/39]
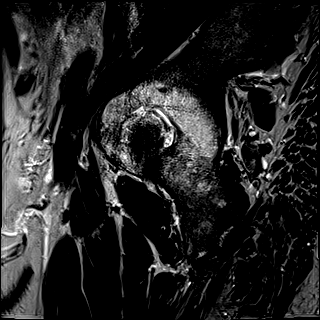

[Series 6: T1 · axial · 3.0mm · 0.31mm/px · 1 of 26 slices shown (2 of 2)]
[im 1/26]
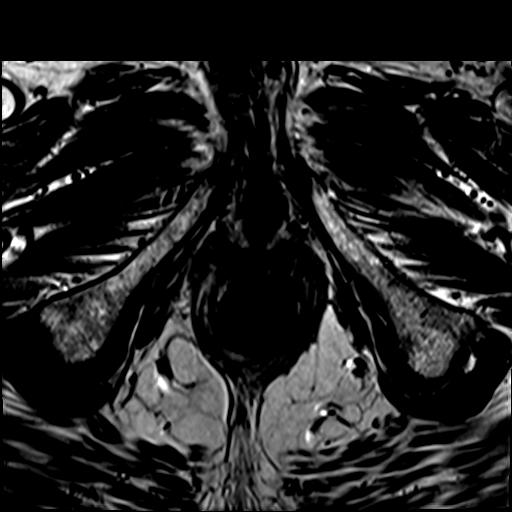

[Series 7: T2 · axial · 3.5mm · 0.56mm/px · 1 of 23 slices shown (2 of 4)]
[im 1/23]
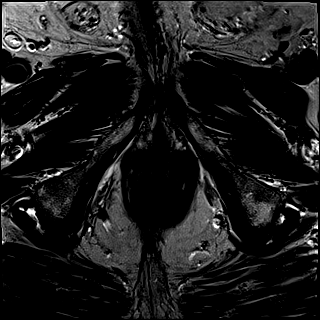

[Series 8: T2 · axial · 1.0mm · 1.04mm/px · 1 of 80 slices shown (3 of 4)]
[im 1/80]
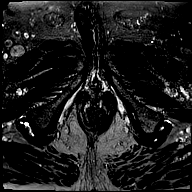

[Series 9: T2 · coronal · 3.5mm · 0.56mm/px · 1 of 23 slices shown (4 of 4)]
[im 1/23]
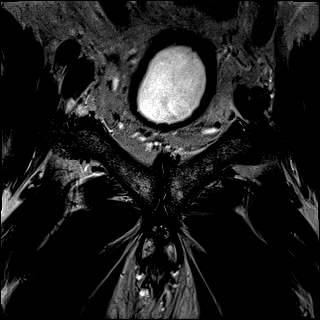

[Series 10: DWI · axial · 3.5mm · 1.56mm/px · 1 of 58 slices shown (1 of 2)]
[im 1/58]
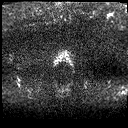

[Series 11: DWI · axial · 3.5mm · 1.56mm/px · 1 of 20 slices shown (2 of 2)]
[im 1/20]
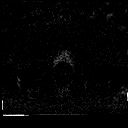

[Series 12: pre t1_twist_tra_dyn_ttc=5.3s · axial · non-contrast · 3.5mm · 0.83mm/px · 1 of 20 slices shown]
[im 1/20]
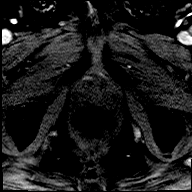

[Series 13: post t1_twist_tra_dyn-copy center · axial · 3.5mm · 0.83mm/px · 1 of 20 slices shown (1 of 23)]
[im 1/20]
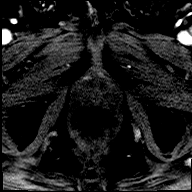

[Series 14: post t1_twist_tra_dyn-copy center · axial · 3.5mm · 0.83mm/px · 1 of 20 slices shown (2 of 23)]
[im 1/20]
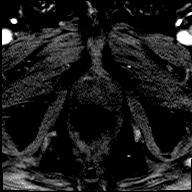

[Series 15: post t1_twist_tra_dyn-copy cent_sub_ttc=(id) · axial · 3.5mm · 0.83mm/px · 1 of 20 slices shown (1 of 22)]
[im 1/20]
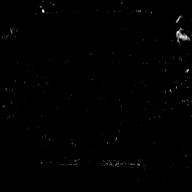

[Series 16: post t1_twist_tra_dyn-copy center · axial · 3.5mm · 0.83mm/px · 1 of 20 slices shown (3 of 23)]
[im 1/20]
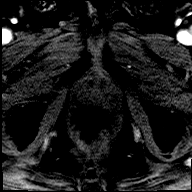

[Series 17: post t1_twist_tra_dyn-copy cent_sub_ttc=(id) · axial · 3.5mm · 0.83mm/px · 1 of 20 slices shown (2 of 22)]
[im 1/20]
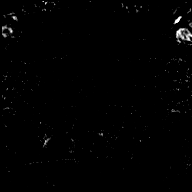

[Series 18: post t1_twist_tra_dyn-copy center · axial · 3.5mm · 0.83mm/px · 1 of 20 slices shown (4 of 23)]
[im 1/20]
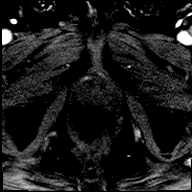

[Series 19: post t1_twist_tra_dyn-copy cent_sub_ttc=(id) · axial · 3.5mm · 0.83mm/px · 1 of 20 slices shown (3 of 22)]
[im 1/20]
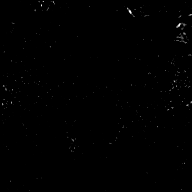

[Series 20: post t1_twist_tra_dyn-copy center · axial · 3.5mm · 0.83mm/px · 1 of 20 slices shown (5 of 23)]
[im 1/20]
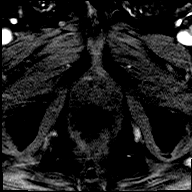

[Series 21: post t1_twist_tra_dyn-copy cent_sub_ttc=(id) · axial · 3.5mm · 0.83mm/px · 1 of 20 slices shown (4 of 22)]
[im 1/20]
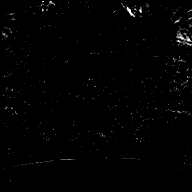

[Series 22: post t1_twist_tra_dyn-copy center · axial · 3.5mm · 0.83mm/px · 1 of 20 slices shown (6 of 23)]
[im 1/20]
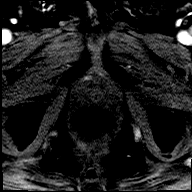

[Series 23: post t1_twist_tra_dyn-copy cent_sub_ttc=(id) · axial · 3.5mm · 0.83mm/px · 1 of 20 slices shown (5 of 22)]
[im 1/20]
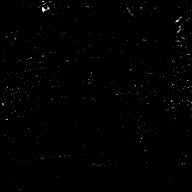

[Series 24: post t1_twist_tra_dyn-copy center · axial · 3.5mm · 0.83mm/px · 1 of 20 slices shown (7 of 23)]
[im 1/20]
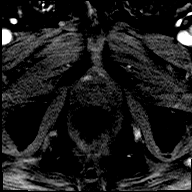

[Series 25: post t1_twist_tra_dyn-copy cent_sub_ttc=(id) · axial · 3.5mm · 0.83mm/px · 1 of 20 slices shown (6 of 22)]
[im 1/20]
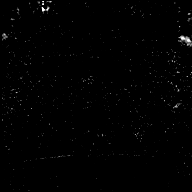

[Series 26: post t1_twist_tra_dyn-copy center · axial · 3.5mm · 0.83mm/px · 1 of 20 slices shown (8 of 23)]
[im 1/20]
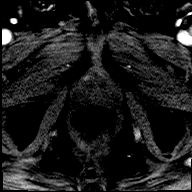

[Series 27: post t1_twist_tra_dyn-copy cent_sub_ttc=(id) · axial · 3.5mm · 0.83mm/px · 1 of 20 slices shown (7 of 22)]
[im 1/20]
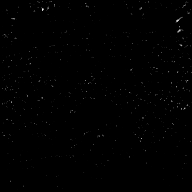

[Series 28: post t1_twist_tra_dyn-copy center · axial · 3.5mm · 0.83mm/px · 1 of 20 slices shown (9 of 23)]
[im 1/20]
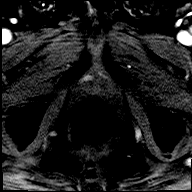

[Series 29: post t1_twist_tra_dyn-copy cent_sub_ttc=(id) · axial · 3.5mm · 0.83mm/px · 1 of 20 slices shown (8 of 22)]
[im 1/20]
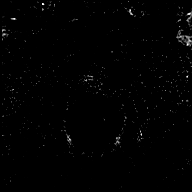

[Series 30: post t1_twist_tra_dyn-copy center · axial · 3.5mm · 0.83mm/px · 1 of 20 slices shown (10 of 23)]
[im 1/20]
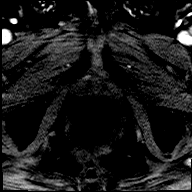

[Series 31: post t1_twist_tra_dyn-copy cent_sub_ttc=(id) · axial · 3.5mm · 0.83mm/px · 1 of 20 slices shown (9 of 22)]
[im 1/20]
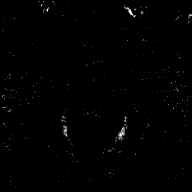

[Series 32: post t1_twist_tra_dyn-copy center · axial · 3.5mm · 0.83mm/px · 1 of 20 slices shown (11 of 23)]
[im 1/20]
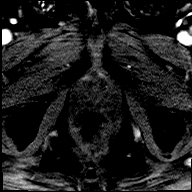

[Series 33: post t1_twist_tra_dyn-copy cent_sub_ttc=(id) · axial · 3.5mm · 0.83mm/px · 1 of 20 slices shown (10 of 22)]
[im 1/20]
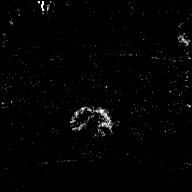

[Series 34: post t1_twist_tra_dyn-copy center · axial · 3.5mm · 0.83mm/px · 1 of 20 slices shown (12 of 23)]
[im 1/20]
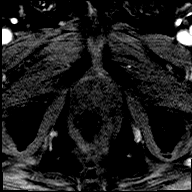

[Series 35: post t1_twist_tra_dyn-copy cent_sub_ttc=(id) · axial · 3.5mm · 0.83mm/px · 1 of 20 slices shown (11 of 22)]
[im 1/20]
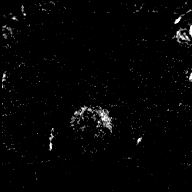

[Series 36: post t1_twist_tra_dyn-copy center · axial · 3.5mm · 0.83mm/px · 1 of 20 slices shown (13 of 23)]
[im 1/20]
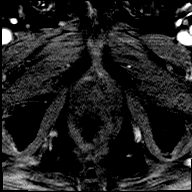

[Series 37: post t1_twist_tra_dyn-copy cent_sub_ttc=(id) · axial · 3.5mm · 0.83mm/px · 1 of 20 slices shown (12 of 22)]
[im 1/20]
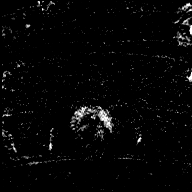

[Series 38: post t1_twist_tra_dyn-copy center · axial · 3.5mm · 0.83mm/px · 1 of 20 slices shown (14 of 23)]
[im 1/20]
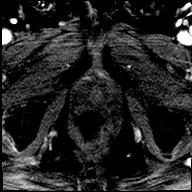

[Series 39: post t1_twist_tra_dyn-copy cent_sub_ttc=(id) · axial · 3.5mm · 0.83mm/px · 1 of 20 slices shown (13 of 22)]
[im 1/20]
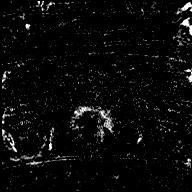

[Series 40: post t1_twist_tra_dyn-copy center · axial · 3.5mm · 0.83mm/px · 1 of 20 slices shown (15 of 23)]
[im 1/20]
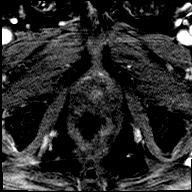

[Series 41: post t1_twist_tra_dyn-copy cent_sub_ttc=(id) · axial · 3.5mm · 0.83mm/px · 1 of 20 slices shown (14 of 22)]
[im 1/20]
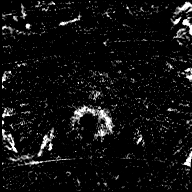

[Series 42: post t1_twist_tra_dyn-copy center · axial · 3.5mm · 0.83mm/px · 1 of 20 slices shown (16 of 23)]
[im 1/20]
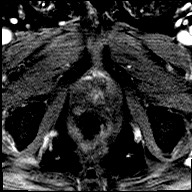

[Series 43: post t1_twist_tra_dyn-copy cent_sub_ttc=(id) · axial · 3.5mm · 0.83mm/px · 1 of 20 slices shown (15 of 22)]
[im 1/20]
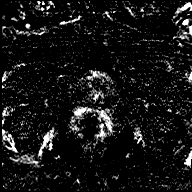

[Series 44: post t1_twist_tra_dyn-copy center · axial · 3.5mm · 0.83mm/px · 1 of 20 slices shown (17 of 23)]
[im 1/20]
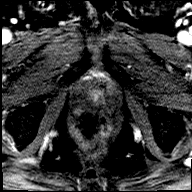

[Series 45: post t1_twist_tra_dyn-copy cent_sub_ttc=(id) · axial · 3.5mm · 0.83mm/px · 1 of 20 slices shown (16 of 22)]
[im 1/20]
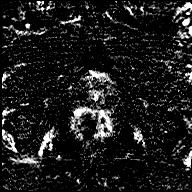

[Series 46: post t1_twist_tra_dyn-copy center · axial · 3.5mm · 0.83mm/px · 1 of 20 slices shown (18 of 23)]
[im 1/20]
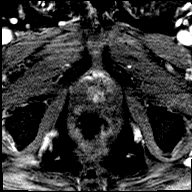

[Series 47: post t1_twist_tra_dyn-copy cent_sub_ttc=(id) · axial · 3.5mm · 0.83mm/px · 1 of 20 slices shown (17 of 22)]
[im 1/20]
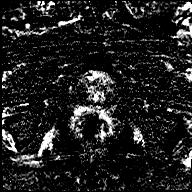

[Series 48: post t1_twist_tra_dyn-copy center · axial · 3.5mm · 0.83mm/px · 1 of 20 slices shown (19 of 23)]
[im 1/20]
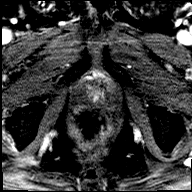

[Series 49: post t1_twist_tra_dyn-copy cent_sub_ttc=(id) · axial · 3.5mm · 0.83mm/px · 1 of 20 slices shown (18 of 22)]
[im 1/20]
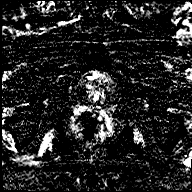

[Series 50: post t1_twist_tra_dyn-copy center · axial · 3.5mm · 0.83mm/px · 1 of 20 slices shown (20 of 23)]
[im 1/20]
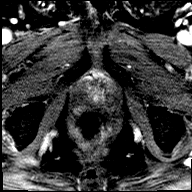

[Series 51: post t1_twist_tra_dyn-copy cent_sub_ttc=(id) · axial · 3.5mm · 0.83mm/px · 1 of 20 slices shown (19 of 22)]
[im 1/20]
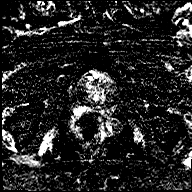

[Series 52: post t1_twist_tra_dyn-copy center · axial · 3.5mm · 0.83mm/px · 1 of 20 slices shown (21 of 23)]
[im 1/20]
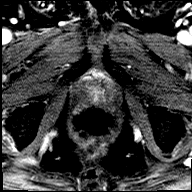

[Series 53: post t1_twist_tra_dyn-copy cent_sub_ttc=(id) · axial · 3.5mm · 0.83mm/px · 1 of 20 slices shown (20 of 22)]
[im 1/20]
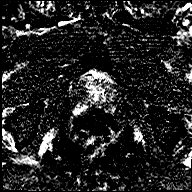

[Series 54: post t1_twist_tra_dyn-copy center · axial · 3.5mm · 0.83mm/px · 1 of 20 slices shown (22 of 23)]
[im 1/20]
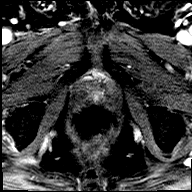

[Series 55: post t1_twist_tra_dyn-copy cent_sub_ttc=(id) · axial · 3.5mm · 0.83mm/px · 1 of 20 slices shown (21 of 22)]
[im 1/20]
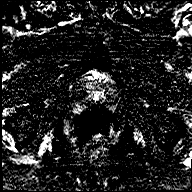

[Series 56: post t1_twist_tra_dyn-copy center · axial · 3.5mm · 0.83mm/px · 1 of 20 slices shown (23 of 23)]
[im 1/20]
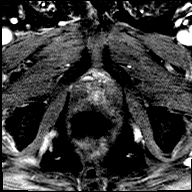

[Series 57: post t1_twist_tra_dyn-copy cent_sub_ttc=(id) · axial · 3.5mm · 0.83mm/px · 1 of 20 slices shown (22 of 22)]
[im 1/20]
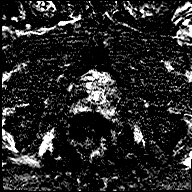

[56 of 56 positions shown; findings below may reference images not displayed]

FINDINGS: Prostate: Demonstrates moderate central gland enlargement and
heterogeneity, consistent with benign prostatic hyperplasia. No
suspicious central gland nodule. The 12 mm nodule within the
posterior mid gland is similar, including image [DATE].

The peripheral zone is compressed. The most conspicuous abnormality
is an area of medial left apical early post-contrast enhancement
measuring 7 mm on image 15/38. This likely corresponds to T2
hypointensity of 5 mm on image [DATE]. Although no well-defined
correlate is seen on diffusion-weighted imaging, there is
susceptibility artifact in this region from adjacent rectal gas.

The right-sided small volume peripheral zone abnormality on the
prior exam is no longer identified.

Heterogeneous peripheral zone signal throughout, especially on the
left. More mild, non masslike early post-contrast enhancement
relatively diffusely throughout the peripheral zone on series 38.

Volume: 6.0 x 5.5 x 6.8 cm (volume = 120 cm^3)

Transcapsular spread:  Absent

Seminal vesicle involvement: Absent

Neurovascular bundle involvement: Absent

Pelvic adenopathy: Absent

Bone metastasis: Absent

Other findings: No significant free fluid. Normal urinary bladder.
Small fat containing left inguinal hernia.
IMPRESSION: 1. 5-7 mm focus of early post-contrast enhancement and possible T2
hypointensity in the medial left apically peripheral zone. Although
diffusion-weighted imaging is not optimal in this area, low volume
disease cannot be excluded ( PI-RADS(v2.1)-3).
2. No evidence of pelvic metastasis.

## 2020-01-18 DIAGNOSIS — H0102A Squamous blepharitis right eye, upper and lower eyelids: Secondary | ICD-10-CM | POA: Diagnosis not present

## 2020-01-18 DIAGNOSIS — H0102B Squamous blepharitis left eye, upper and lower eyelids: Secondary | ICD-10-CM | POA: Diagnosis not present

## 2020-01-18 DIAGNOSIS — H0015 Chalazion left lower eyelid: Secondary | ICD-10-CM | POA: Diagnosis not present

## 2020-01-18 DIAGNOSIS — H04123 Dry eye syndrome of bilateral lacrimal glands: Secondary | ICD-10-CM | POA: Diagnosis not present

## 2020-02-05 ENCOUNTER — Ambulatory Visit: Payer: Medicare Other

## 2020-02-11 DIAGNOSIS — G4733 Obstructive sleep apnea (adult) (pediatric): Secondary | ICD-10-CM | POA: Diagnosis not present

## 2020-02-19 ENCOUNTER — Other Ambulatory Visit: Payer: Self-pay

## 2020-02-19 ENCOUNTER — Ambulatory Visit (INDEPENDENT_AMBULATORY_CARE_PROVIDER_SITE_OTHER): Payer: Medicare Other | Admitting: Pharmacist

## 2020-02-19 VITALS — BP 142/72

## 2020-02-19 DIAGNOSIS — I2510 Atherosclerotic heart disease of native coronary artery without angina pectoris: Secondary | ICD-10-CM | POA: Diagnosis not present

## 2020-02-19 DIAGNOSIS — I10 Essential (primary) hypertension: Secondary | ICD-10-CM | POA: Diagnosis not present

## 2020-02-19 MED ORDER — HYDROCHLOROTHIAZIDE 12.5 MG PO CAPS: 12.5000 mg | ORAL_CAPSULE | Freq: Every morning | ORAL | 0 refills | Status: DC

## 2020-02-19 NOTE — Progress Notes (Signed)
Patient ID: Jason Compton                 DOB: Mar 28, 1936                      MRN: [id number]     HPI: Jason Compton is a 84 y.o. male referred by Dr. Oval Linsey to HTN clinic. PMH is significant for CAD, OSA on CPAP, HLD, Pre DM, and celiac disease.  At visit with Dr Oval Linsey three months ago, amlodipine was increased to 10 mg.  Patient presents today in good spirits, however has noticed lower leg edema since increasing amlodipine dose.  More prominent around left ankle.  Not causing any physical discomfort.  Before Covid pandemic patient reports he and his wife were constantly traveling and eating out.  In the past year however they have been making most meals at home.  Patient's cholesterol and blood sugar have since decreased.  Reports he does add salt and other spices to all of his food.      Patient drinks wine nightly but reports no tobacco in last 50-60 years.  Was a frequent exerciser before the pandemic but reports has not been as active recently.    Brought home blood pressure monitor.  Readings are occasionally less than 130/80 but majority are in 140s/150s / 80s/90s.   Current HTN meds: amlodipine 10 mg  Previously tried: amlodipine 5, olmesartan 40 mg (GI upset) BP goal: <130/80  Family History:  Patient unaware of any significant FH  Social History: Every day wine drinker.  No tobacco  Diet: Eats low gluten/gluten free diet.  Adds salt to food.    Exercise: Pre covid rode bicycle and treadmill.  Now mostly walks.      Home BP readings:   Wt Readings from Last 3 Encounters:  12/06/19 211 lb 9.6 oz (96 kg)  11/28/19 213 lb (96.6 kg)  10/31/19 209 lb 6.4 oz (95 kg)   BP Readings from Last 3 Encounters:  12/06/19 140/73  11/28/19 (!) 144/82  10/31/19 (!) 158/78   Pulse Readings from Last 3 Encounters:  12/06/19 74  11/28/19 63  10/31/19 65    Renal function: CrCl cannot be calculated (Patient's most recent lab result is older than the maximum 21 days  allowed.).  Past Medical History:  Diagnosis Date   Exogenous obesity    GERD (gastroesophageal reflux disease)    History of hiatal hernia    Hyperlipidemia    Hypertension    PVC's (premature ventricular contractions)    Skin cancer     Current Outpatient Medications on File Prior to Visit  Medication Sig Dispense Refill   amLODipine (NORVASC) 10 MG tablet Take 1 tablet (10 mg total) by mouth daily. 90 tablet 3   aspirin 81 MG tablet Take 81 mg by mouth daily.       cholecalciferol (VITAMIN D) 1000 UNITS tablet Take 1,000 Units by mouth daily.      cycloSPORINE (RESTASIS) 0.05 % ophthalmic emulsion Place 1 drop into both eyes 2 (two) times daily.     doxycycline (VIBRA-TABS) 100 MG tablet      ezetimibe (ZETIA) 10 MG tablet TAKE 1 TABLET BY MOUTH  DAILY 90 tablet 2   Multiple Vitamin (MULTIVITAMIN) tablet Take 1 tablet by mouth daily.       omeprazole (PRILOSEC) 20 MG capsule Take 20 mg by mouth daily.      OVER THE COUNTER MEDICATION hydroEyes 2 tablets twice a  day     rosuvastatin (CRESTOR) 20 MG tablet 1 TABLET BY MOUTH 3 TIMES A WEEK OR AS DIRECTED 60 tablet 3   triamcinolone cream (KENALOG) 0.1 % Apply 1 application topically 2 (two) times daily. 30 g 0   vitamin C (ASCORBIC ACID) 500 MG tablet Take 500 mg by mouth daily.       No current facility-administered medications on file prior to visit.    Allergies  Allergen Reactions   Altace [Ramipril]     Doesn't recall reaction     Assessment/Plan:  1. Hypertension - Patient BP today 142/72 which is above goal of <130/80.  Checked BP using patient's home cuff in room also and was comparable.  Patient hypertension uncontrolled likely due to diet choices, alcohol intake, and lack of medications.  Concern over patients lower leg swelling. Recommended decreasing amlodipine back to 5 mg since patient was not having side effects at that dose.  Counseled patient to avoid adding salt to food since it is likely  contributing to hypertension.  Patient would benefit from addition of another agent.  Will start with HCTZ due to edema and then likely switch to an ARB other than Benicar.  Advised patient we would check bloodwork once starting on HCTZ and recheck in 1 month.  Patient voiced understanding.  [name], PharmD, BCACP, [place] [address], [place], [place] [address] Phone: [phone]; Fax: [phone] 02/19/2020 4:44 PM

## 2020-02-19 NOTE — Patient Instructions (Addendum)
It was nice meeting you today!  Reduce your amlodipine to 5 mg daily  Begin hydrochlorothiazide 12.5 mg once daily in the morning  Lets check your bloodwork in about 2 weeks  Remember to watch how much salt you are adding to your foods  [name], PharmD, [facility], [place] [address], [place], [place] [address] Phone: [phone]; Fax: [phone] 02/19/2020 3:56 PM

## 2020-02-27 DIAGNOSIS — R350 Frequency of micturition: Secondary | ICD-10-CM | POA: Diagnosis not present

## 2020-03-04 ENCOUNTER — Other Ambulatory Visit: Payer: Medicare Other

## 2020-03-05 DIAGNOSIS — Z85828 Personal history of other malignant neoplasm of skin: Secondary | ICD-10-CM | POA: Diagnosis not present

## 2020-03-05 DIAGNOSIS — L570 Actinic keratosis: Secondary | ICD-10-CM | POA: Diagnosis not present

## 2020-03-05 DIAGNOSIS — L821 Other seborrheic keratosis: Secondary | ICD-10-CM | POA: Diagnosis not present

## 2020-03-05 DIAGNOSIS — L308 Other specified dermatitis: Secondary | ICD-10-CM | POA: Diagnosis not present

## 2020-03-06 ENCOUNTER — Ambulatory Visit: Payer: Self-pay | Admitting: Cardiology

## 2020-03-07 ENCOUNTER — Other Ambulatory Visit: Payer: Self-pay

## 2020-03-07 DIAGNOSIS — I10 Essential (primary) hypertension: Secondary | ICD-10-CM

## 2020-03-07 DIAGNOSIS — E7800 Pure hypercholesterolemia, unspecified: Secondary | ICD-10-CM

## 2020-03-07 DIAGNOSIS — I2510 Atherosclerotic heart disease of native coronary artery without angina pectoris: Secondary | ICD-10-CM

## 2020-03-08 LAB — BASIC METABOLIC PANEL
BUN/Creatinine Ratio: 14 (ref 10–24)
BUN: 12 mg/dL (ref 8–27)
CO2: 19 mmol/L — ABNORMAL LOW (ref 20–29)
Calcium: 8.9 mg/dL (ref 8.6–10.2)
Chloride: 99 mmol/L (ref 96–106)
Creatinine, Ser: 0.86 mg/dL (ref 0.76–1.27)
GFR calc Af Amer: 94 mL/min/{1.73_m2} (ref >59)
GFR calc non Af Amer: 81 mL/min/{1.73_m2} (ref >59)
Glucose: 107 mg/dL — ABNORMAL HIGH (ref 65–99)
Potassium: 4.6 mmol/L (ref 3.5–5.2)
Sodium: 135 mmol/L (ref 134–144)

## 2020-03-08 LAB — LIPID PANEL
Chol/HDL Ratio: 2.3 ratio (ref 0.0–5.0)
Cholesterol, Total: 166 mg/dL (ref 100–199)
HDL: 72 mg/dL (ref >39)
LDL Chol Calc (NIH): 84 mg/dL (ref 0–99)
Triglycerides: 47 mg/dL (ref 0–149)
VLDL Cholesterol Cal: 10 mg/dL (ref 5–40)

## 2020-03-20 ENCOUNTER — Other Ambulatory Visit: Payer: Self-pay | Admitting: Urology

## 2020-03-20 ENCOUNTER — Other Ambulatory Visit (HOSPITAL_COMMUNITY): Payer: Self-pay | Admitting: Urology

## 2020-03-20 DIAGNOSIS — C61 Malignant neoplasm of prostate: Secondary | ICD-10-CM

## 2020-03-26 ENCOUNTER — Encounter: Payer: Self-pay | Admitting: Pharmacist

## 2020-03-26 ENCOUNTER — Other Ambulatory Visit: Payer: Self-pay

## 2020-03-26 ENCOUNTER — Ambulatory Visit (INDEPENDENT_AMBULATORY_CARE_PROVIDER_SITE_OTHER): Payer: Medicare Other | Admitting: Pharmacist

## 2020-03-26 VITALS — BP 134/78 | HR 66

## 2020-03-26 DIAGNOSIS — I10 Essential (primary) hypertension: Secondary | ICD-10-CM | POA: Diagnosis not present

## 2020-03-26 MED ORDER — HYDROCHLOROTHIAZIDE 25 MG PO TABS: 25.0000 mg | ORAL_TABLET | Freq: Every day | ORAL | 1 refills | Status: DC

## 2020-03-26 NOTE — Progress Notes (Signed)
Patient ID: Jason Compton                 DOB: [birth year]                      MRN: [id number]     HPI: Jason Compton is a 84 y.o. male referred by Dr. Oval Linsey to HTN clinic. PMH is significant for significant for CAD, OSA on CPAP, HLD, Pre DM, and celiac disease.  At visit with Dr Oval Linsey three months ago, amlodipine was increased to 10 mg.  At last HTN visit, patient developed swelling in legs and amlodipine was decreased to 5 mg and HCTZ 12.5mg  was started.  Presents today in good spirits.  Did not bring cuff today but reports systolic blood pressure has been as low as 120 and as high as 156.  Did not notice any increased urination and lab work from 7/16 came back WNL.    Current HTN meds: amlodipine 5 mg, hctz 12.5 mg  Previously tried: amlodipine 10 mg, benicar BP goal: <130/80   Wt Readings from Last 3 Encounters:  12/06/19 211 lb 9.6 oz (96 kg)  11/28/19 213 lb (96.6 kg)  10/31/19 209 lb 6.4 oz (95 kg)   BP Readings from Last 3 Encounters:  02/19/20 (!) 142/72  12/06/19 140/73  11/28/19 (!) 144/82   Pulse Readings from Last 3 Encounters:  12/06/19 74  11/28/19 63  10/31/19 65    Renal function: CrCl cannot be calculated (Unknown ideal weight.).  Past Medical History:  Diagnosis Date  . Exogenous obesity   . GERD (gastroesophageal reflux disease)   . History of hiatal hernia   . Hyperlipidemia   . Hypertension   . PVC's (premature ventricular contractions)   . Skin cancer     Current Outpatient Medications on File Prior to Visit  Medication Sig Dispense Refill  . amLODipine (NORVASC) 10 MG tablet Take 1 tablet (10 mg total) by mouth daily. (Patient taking differently: Take 5 mg by mouth daily. ) 90 tablet 3  . aspirin 81 MG tablet Take 81 mg by mouth daily.      . cholecalciferol (VITAMIN D) 1000 UNITS tablet Take 1,000 Units by mouth daily.     . cycloSPORINE (RESTASIS) 0.05 % ophthalmic emulsion Place 1 drop into both eyes 2 (two) times daily.      Marland Kitchen doxycycline (VIBRA-TABS) 100 MG tablet     . ezetimibe (ZETIA) 10 MG tablet TAKE 1 TABLET BY MOUTH  DAILY 90 tablet 2  . hydrochlorothiazide (MICROZIDE) 12.5 MG capsule Take 1 capsule (12.5 mg total) by mouth in the morning. 30 capsule 0  . Multiple Vitamin (MULTIVITAMIN) tablet Take 1 tablet by mouth daily.      Marland Kitchen omeprazole (PRILOSEC) 20 MG capsule Take 20 mg by mouth daily.     Marland Kitchen OVER THE COUNTER MEDICATION hydroEyes 2 tablets twice a day    . rosuvastatin (CRESTOR) 20 MG tablet 1 TABLET BY MOUTH 3 TIMES A WEEK OR AS DIRECTED 60 tablet 3  . triamcinolone cream (KENALOG) 0.1 % Apply 1 application topically 2 (two) times daily. 30 g 0  . vitamin C (ASCORBIC ACID) 500 MG tablet Take 500 mg by mouth daily.       No current facility-administered medications on file prior to visit.    Allergies  Allergen Reactions  . Benicar [Olmesartan] Diarrhea  . Altace [Ramipril]     Doesn't recall reaction     Assessment/Plan:  1. Hypertension - Patient BP today 134/78 which is improved, however still above goal of <130/80.  Will increase HCTZ to 25 mg once daily and continue to have patient monitor BP at home.  Recheck in 1 month before appointment with Dr. Oval Linsey.  Jason Compton, PharmD, BCACP, [place] [address], [place], [place] [address] Phone: [phone]; Fax: [phone] 03/26/2020 8:56 AM

## 2020-03-26 NOTE — Patient Instructions (Signed)
It was good seeing you again.  Your blood pressure is improving but still slightly above the goal of less than 130/80  Continue your amlodipine 5 mg and we will increase your hydrochlorothiazide to 25 mg once daily  Please continue to check your blood pressure at home and call us with any questions  [name], PharmD, [facility], [place] [address], [place], [place] [address] Phone: [phone]; Fax: [phone] 03/26/2020 8:34 AM

## 2020-04-15 ENCOUNTER — Other Ambulatory Visit: Payer: Self-pay | Admitting: Cardiovascular Disease

## 2020-04-17 ENCOUNTER — Ambulatory Visit
Admission: RE | Admit: 2020-04-17 | Discharge: 2020-04-17 | Disposition: A | Discharge status: Home / Self Care | Payer: Medicare Other | Source: Ambulatory Visit | Attending: Urology | Admitting: Urology

## 2020-04-17 DIAGNOSIS — C61 Malignant neoplasm of prostate: Secondary | ICD-10-CM

## 2020-04-17 MED ORDER — GADOBENATE DIMEGLUMINE 529 MG/ML IV SOLN
19.0000 mL | Freq: Once | INTRAVENOUS | Status: AC | PRN
  Administered 2020-04-17: 19 mL via INTRAVENOUS

## 2020-05-05 NOTE — Progress Notes (Signed)
Subjective:    Patient ID: Jason Compton, male    DOB: [birth year], 84 y.o.   MRN: [id number]  HPI The patient is here for follow up of their chronic medical problems, including CAD, htn, hyperlipidemia, prediabetes, OSA.  He is taking all of his medications as prescribed.    He is not exercising regularly.     To have prostate bx on 9/22. Had MRI - area that has grown slightly in the past five years.    Eyes itching.  Saw dr Bing Plume.  Still having some eye issues and has an appointment tomorrow.   Using cpap.  He uses it religiously.      Medications and allergies reviewed with patient and updated if appropriate.  Patient Active Problem List   Diagnosis Date Noted  . Inguinal hernia 05/06/2020  . Cough 10/10/2018  . Herpes simplex 07/25/2018  . Allergic rhinitis 07/25/2018  . OSA on CPAP 05/08/2018  . History of adenomatous polyp of colon 02/15/2018  . Internal hemorrhoids 02/15/2018  . Cervical spondylosis without myelopathy 10/13/2017  . Aortic atherosclerosis ([place]) 04/28/2017  . Coronary artery disease 04/28/2017  . Prediabetes 11/02/2016  . Diverticulosis 07/30/2013  . Hard of hearing 07/30/2013  . Hypertension 03/08/2011  . Hypercholesterolemia 03/08/2011  . PSA elevation 03/08/2011    Current Outpatient Medications on File Prior to Visit  Medication Sig Dispense Refill  . amLODipine (NORVASC) 5 MG tablet Take 5 mg by mouth daily.    Marland Kitchen aspirin 81 MG tablet Take 81 mg by mouth daily.      . cholecalciferol (VITAMIN D) 1000 UNITS tablet Take 1,000 Units by mouth daily.     . cycloSPORINE (RESTASIS) 0.05 % ophthalmic emulsion Place 1 drop into both eyes 2 (two) times daily.    Marland Kitchen ezetimibe (ZETIA) 10 MG tablet TAKE 1 TABLET BY MOUTH  DAILY 90 tablet 2  . hydrochlorothiazide (HYDRODIURIL) 25 MG tablet Take 1 tablet (25 mg total) by mouth daily. 90 tablet 1  . Multiple Vitamin (MULTIVITAMIN) tablet Take 1 tablet by mouth daily.      Marland Kitchen omeprazole (PRILOSEC) 20  MG capsule Take 20 mg by mouth daily.     Marland Kitchen OVER THE COUNTER MEDICATION hydroEyes 2 tablets twice a day    . rosuvastatin (CRESTOR) 20 MG tablet TAKE 1 TABLET BY MOUTH 3  TIMES WEEKLY OR AS DIRECTED 39 tablet 6  . triamcinolone cream (KENALOG) 0.1 % Apply 1 application topically 2 (two) times daily. 30 g 0  . vitamin C (ASCORBIC ACID) 500 MG tablet Take 500 mg by mouth daily.       No current facility-administered medications on file prior to visit.    Past Medical History:  Diagnosis Date  . Exogenous obesity   . GERD (gastroesophageal reflux disease)   . History of hiatal hernia   . Hyperlipidemia   . Hypertension   . PVC's (premature ventricular contractions)   . Skin cancer     Past Surgical History:  Procedure Laterality Date  . CARDIOVASCULAR STRESS TEST  04/18/2009   NORMAL  . CATARACT EXTRACTION      Social History   Socioeconomic History  . Marital status: Married    Spouse name: Not on file  . Number of children: Not on file  . Years of education: Not on file  . Highest education level: Not on file  Occupational History  . Not on file  Tobacco Use  . Smoking status: Never Smoker  . Smokeless  tobacco: Never Used  Substance and Sexual Activity  . Alcohol use: Yes    Alcohol/week: 0.0 standard drinks    Comment: wine  . Drug use: No  . Sexual activity: Not on file  Other Topics Concern  . Not on file  Social History Narrative  . Not on file   Social Determinants of Health   Financial Resource Strain:   . Difficulty of Paying Living Expenses: Not on file  Food Insecurity:   . Worried About Charity fundraiser in the Last Year: Not on file  . Ran Out of Food in the Last Year: Not on file  Transportation Needs:   . Lack of Transportation (Medical): Not on file  . Lack of Transportation (Non-Medical): Not on file  Physical Activity:   . Days of Exercise per Week: Not on file  . Minutes of Exercise per Session: Not on file  Stress:   . Feeling of  Stress : Not on file  Social Connections:   . Frequency of Communication with Friends and Family: Not on file  . Frequency of Social Gatherings with Friends and Family: Not on file  . Attends Religious Services: Not on file  . Active Member of Clubs or Organizations: Not on file  . Attends Archivist Meetings: Not on file  . Marital Status: Not on file    Family History  Problem Relation Age of Onset  . Osteoporosis Mother   . Cancer Father        skin  . Hypertension Brother     Review of Systems  Constitutional: Negative for chills and fever.  Respiratory: Positive for cough (dry cough at night with cpap). Negative for shortness of breath and wheezing.   Cardiovascular: Negative for chest pain, palpitations and leg swelling.  Neurological: Negative for dizziness, light-headedness and headaches.       Objective:   Vitals:   05/06/20 0958  BP: 132/80  Pulse: 78  Temp: 98.8 F (37.1 C)  SpO2: 98%   BP Readings from Last 3 Encounters:  05/06/20 132/80  03/26/20 134/78  02/19/20 (!) 142/72   Wt Readings from Last 3 Encounters:  05/06/20 204 lb 12.8 oz (92.9 kg)  12/06/19 211 lb 9.6 oz (96 kg)  11/28/19 213 lb (96.6 kg)   Body mass index is 31.14 kg/m.   Physical Exam    Constitutional: Appears well-developed and well-nourished. No distress.  HENT:  Head: Normocephalic and atraumatic.  Neck: Neck supple. No tracheal deviation present. No thyromegaly present.  No cervical lymphadenopathy Cardiovascular: Normal rate, regular rhythm and normal heart sounds.   No murmur heard. No carotid bruit .  No edema Pulmonary/Chest: Effort normal and breath sounds normal. No respiratory distress. No has no wheezes. No rales.  Skin: Skin is warm and dry. Not diaphoretic.  Psychiatric: Normal mood and affect. Behavior is normal.      Assessment & Plan:    See Problem List for Assessment and Plan of chronic medical problems.    This visit occurred during the  SARS-CoV-2 public health emergency.  Safety protocols were in place, including screening questions prior to the visit, additional usage of staff PPE, and extensive cleaning of exam room while observing appropriate contact time as indicated for disinfecting solutions.

## 2020-05-05 NOTE — Patient Instructions (Addendum)
  Blood work was ordered.   ° ° °Medications reviewed and updated.  Changes include :   none ° ° ° °Please followup in 6 months ° ° °

## 2020-05-06 ENCOUNTER — Encounter: Payer: Self-pay | Admitting: Internal Medicine

## 2020-05-06 ENCOUNTER — Ambulatory Visit (INDEPENDENT_AMBULATORY_CARE_PROVIDER_SITE_OTHER): Payer: Medicare Other | Admitting: Internal Medicine

## 2020-05-06 ENCOUNTER — Other Ambulatory Visit: Payer: Self-pay

## 2020-05-06 VITALS — BP 132/80 | HR 78 | Temp 98.8°F | Wt 204.8 lb

## 2020-05-06 DIAGNOSIS — E7800 Pure hypercholesterolemia, unspecified: Secondary | ICD-10-CM

## 2020-05-06 DIAGNOSIS — G4733 Obstructive sleep apnea (adult) (pediatric): Secondary | ICD-10-CM

## 2020-05-06 DIAGNOSIS — Z9989 Dependence on other enabling machines and devices: Secondary | ICD-10-CM

## 2020-05-06 DIAGNOSIS — I2510 Atherosclerotic heart disease of native coronary artery without angina pectoris: Secondary | ICD-10-CM

## 2020-05-06 DIAGNOSIS — R7303 Prediabetes: Secondary | ICD-10-CM

## 2020-05-06 DIAGNOSIS — I10 Essential (primary) hypertension: Secondary | ICD-10-CM | POA: Diagnosis not present

## 2020-05-06 DIAGNOSIS — R972 Elevated prostate specific antigen [PSA]: Secondary | ICD-10-CM

## 2020-05-06 DIAGNOSIS — K4090 Unilateral inguinal hernia, without obstruction or gangrene, not specified as recurrent: Secondary | ICD-10-CM | POA: Insufficient documentation

## 2020-05-06 DIAGNOSIS — I700 Atherosclerosis of aorta: Secondary | ICD-10-CM

## 2020-05-06 NOTE — Assessment & Plan Note (Signed)
Chronic On Crestor 20 mg daily and Zetia Encouraged regular exercise Lipids are well controlled

## 2020-05-06 NOTE — Assessment & Plan Note (Signed)
Chronic Taking Zetia and Crestor Recent lipid panel was well controlled Managed by cardiology

## 2020-05-06 NOTE — Assessment & Plan Note (Signed)
Chronic Check a1c Low sugar / carb diet Stressed regular exercise  

## 2020-05-06 NOTE — Assessment & Plan Note (Signed)
Chronic Following with urology Recent MRI of prostate showed an area of growth and will be having a biopsy later this month

## 2020-05-06 NOTE — Assessment & Plan Note (Signed)
Chronic Following with cardiology No concerning symptoms

## 2020-05-06 NOTE — Assessment & Plan Note (Signed)
Chronic Uses CPAP religiously

## 2020-05-06 NOTE — Assessment & Plan Note (Addendum)
Chronic BP well controlled Current regimen effective and well tolerated Continue current medications at current doses CMP

## 2020-05-07 DIAGNOSIS — H04123 Dry eye syndrome of bilateral lacrimal glands: Secondary | ICD-10-CM | POA: Diagnosis not present

## 2020-05-07 DIAGNOSIS — H0102A Squamous blepharitis right eye, upper and lower eyelids: Secondary | ICD-10-CM | POA: Diagnosis not present

## 2020-05-07 DIAGNOSIS — H0102B Squamous blepharitis left eye, upper and lower eyelids: Secondary | ICD-10-CM | POA: Diagnosis not present

## 2020-05-07 LAB — COMPLETE METABOLIC PANEL WITH GFR
AG Ratio: 1.6 (calc) (ref 1.0–2.5)
ALT: 24 U/L (ref 9–46)
AST: 21 U/L (ref 10–35)
Albumin: 4.1 g/dL (ref 3.6–5.1)
Alkaline phosphatase (APISO): 54 U/L (ref 35–144)
BUN: 14 mg/dL (ref 7–25)
CO2: 30 mmol/L (ref 20–32)
Calcium: 9.3 mg/dL (ref 8.6–10.3)
Chloride: 96 mmol/L — ABNORMAL LOW (ref 98–110)
Creat: 0.75 mg/dL (ref 0.70–1.11)
GFR, Est African American: 99 mL/min/{1.73_m2} (ref >=60)
GFR, Est Non African American: 85 mL/min/{1.73_m2} (ref >=60)
Globulin: 2.5 g/dL (calc) (ref 1.9–3.7)
Glucose, Bld: 93 mg/dL (ref 65–99)
Potassium: 4 mmol/L (ref 3.5–5.3)
Sodium: 133 mmol/L — ABNORMAL LOW (ref 135–146)
Total Bilirubin: 1.1 mg/dL (ref 0.2–1.2)
Total Protein: 6.6 g/dL (ref 6.1–8.1)

## 2020-05-07 LAB — HEMOGLOBIN A1C
Hgb A1c MFr Bld: 5.6 % of total Hgb (ref <5.7)
Mean Plasma Glucose: 114 (calc)
eAG (mmol/L): 6.3 (calc)

## 2020-05-12 DIAGNOSIS — G4733 Obstructive sleep apnea (adult) (pediatric): Secondary | ICD-10-CM | POA: Diagnosis not present

## 2020-05-12 NOTE — Progress Notes (Signed)
Patient ID: Jason Compton                 DOB: October 20, 1935                      MRN: [id number]     HPI: Jason Compton is a 84 y.o. male referred by Dr. Oval Linsey to HTN clinic. PMH is significant forsignificant for CAD, OSA on CPAP, HLD,pre-DM,and celiac disease. At last visit on 03/26/20, HCTZ was increased to 25 mg daily for additional BP control.   Patient presents today in good spirits. Reports medication adherence and no side effects with amlodipine and HCTZ. Reports LE swelling has significantly improved since decreasing amlodipine from 10 mg to 5 mg daily. Denies dizziness and headaches. Reports blurred vision in the mornings and visited ophthalmologist who prescribed eye drops. Reports checking BP ~3 times a week and SBP averages around 132 (see below).  Current HTN meds: amlodipine 5 mg daily (PM), HCTZ 25 mg daily (AM) Previously tried: amlodipine 10 mg (LE swelling), olmesartan 50 mg daily (GI upset) BP goal: <130/80 mmHg  Family History: Patient unaware of any significant FH  Social History: Every day wine drinker. Denies tobacco use  Diet: Eats low gluten/gluten free diet. Adds salt to food.    Exercise: Pre covid rode bicycle and treadmill. Now mostly walks  Home BP readings: 9/6: 129/82, 137/90 03/2020: 132/78, 135/85, 141/81, 142/83, 130/74, 138/76, 137/78 HR 81  Wt Readings from Last 3 Encounters:  05/06/20 204 lb 12.8 oz (92.9 kg)  12/06/19 211 lb 9.6 oz (96 kg)  11/28/19 213 lb (96.6 kg)   BP Readings from Last 3 Encounters:  05/13/20 126/86  05/06/20 132/80  03/26/20 134/78   Pulse Readings from Last 3 Encounters:  05/13/20 69  05/06/20 78  03/26/20 66    Renal function: Estimated Creatinine Clearance: 78.7 mL/min (by C-G formula based on SCr of 0.75 mg/dL).  Past Medical History:  Diagnosis Date   Exogenous obesity    GERD (gastroesophageal reflux disease)    History of hiatal hernia    Hyperlipidemia    Hypertension    PVC's  (premature ventricular contractions)    Skin cancer   /  Current Outpatient Medications on File Prior to Visit  Medication Sig Dispense Refill   amLODipine (NORVASC) 5 MG tablet Take 5 mg by mouth daily.     aspirin 81 MG tablet Take 81 mg by mouth daily.       cholecalciferol (VITAMIN D) 1000 UNITS tablet Take 1,000 Units by mouth daily.      cycloSPORINE (RESTASIS) 0.05 % ophthalmic emulsion Place 1 drop into both eyes 2 (two) times daily.     ezetimibe (ZETIA) 10 MG tablet TAKE 1 TABLET BY MOUTH  DAILY 90 tablet 2   hydrochlorothiazide (HYDRODIURIL) 25 MG tablet Take 1 tablet (25 mg total) by mouth daily. 90 tablet 1   Multiple Vitamin (MULTIVITAMIN) tablet Take 1 tablet by mouth daily.       omeprazole (PRILOSEC) 20 MG capsule Take 20 mg by mouth daily.      OVER THE COUNTER MEDICATION hydroEyes 2 tablets twice a day     rosuvastatin (CRESTOR) 20 MG tablet TAKE 1 TABLET BY MOUTH 3  TIMES WEEKLY OR AS DIRECTED 39 tablet 6   triamcinolone cream (KENALOG) 0.1 % Apply 1 application topically 2 (two) times daily. 30 g 0   vitamin C (ASCORBIC ACID) 500 MG tablet Take 500 mg by mouth  daily.       No current facility-administered medications on file prior to visit.    Allergies  Allergen Reactions   Altace [Ramipril]     Doesn't recall reaction   Assessment/Plan:  1. Hypertension - BP near goal of < 130/80 mmHg. Medication adherence appears optimal as clinic BP and two September BP readings are improving. Will make no medication changes today. Encouraged patient to check BP daily at least 1-hour after taking medications and to bring log and BP monitor to next visit. Patient verablized understanding. Encouraged patient to eat a healthy diet full of vegetables, fruits and lean meats (chicken, [place], fish) and to limit salt intake. Encouraged patient to exercise/walk for 20-30 mins everyday. Can consider adding valsartan 80 or 160 mg daily at next visit for additional BP  management. Patient states he will be vacationing to [place] from October 8th to November 4th. Will follow-up with patient in 6-7 weeks. Will check BMET today due to low sodium after increasing HCTZ at last visit.   Lorel Monaco, PharmD PGY2 [facility] [address] [place], [place] [address] Phone: [phone]; Fax: [phone]

## 2020-05-13 ENCOUNTER — Other Ambulatory Visit: Payer: Self-pay

## 2020-05-13 ENCOUNTER — Ambulatory Visit (INDEPENDENT_AMBULATORY_CARE_PROVIDER_SITE_OTHER): Payer: Medicare Other | Admitting: Pharmacist

## 2020-05-13 VITALS — BP 126/86 | HR 69

## 2020-05-13 DIAGNOSIS — I10 Essential (primary) hypertension: Secondary | ICD-10-CM

## 2020-05-13 LAB — BASIC METABOLIC PANEL
BUN/Creatinine Ratio: 13 (ref 10–24)
BUN: 11 mg/dL (ref 8–27)
CO2: 26 mmol/L (ref 20–29)
Calcium: 9.3 mg/dL (ref 8.6–10.2)
Chloride: 95 mmol/L — ABNORMAL LOW (ref 96–106)
Creatinine, Ser: 0.86 mg/dL (ref 0.76–1.27)
GFR calc Af Amer: 93 mL/min/{1.73_m2} (ref >59)
GFR calc non Af Amer: 81 mL/min/{1.73_m2} (ref >59)
Glucose: 113 mg/dL — ABNORMAL HIGH (ref 65–99)
Potassium: 4.4 mmol/L (ref 3.5–5.2)
Sodium: 133 mmol/L — ABNORMAL LOW (ref 134–144)

## 2020-05-13 NOTE — Patient Instructions (Signed)
It was nice to see you today!  Your goal blood pressure is <130/80 mmHg. In clinic, your blood pressure was 126/86 mmHg.  Medication Changes: Continue all hypertension medications  Monitor blood pressure at home daily and keep a log (on your phone or piece of paper) to bring with you to your next visit. Write down date, time, blood pressure and pulse.  Keep up the good work with diet and exercise. Aim for a diet full of vegetables, fruit and lean meats (chicken, [place], fish). Try to limit salt intake by eating fresh or frozen vegetables (instead of canned), rinse canned vegetables prior to cooking and do not add any additional salt to meals.   Please give [place] a call at [phone] with any questions or concerns.

## 2020-05-16 ENCOUNTER — Other Ambulatory Visit: Payer: Self-pay | Admitting: Cardiovascular Disease

## 2020-05-16 ENCOUNTER — Other Ambulatory Visit: Payer: Self-pay

## 2020-05-20 ENCOUNTER — Other Ambulatory Visit: Payer: Self-pay

## 2020-05-21 ENCOUNTER — Other Ambulatory Visit: Payer: Self-pay

## 2020-05-21 ENCOUNTER — Encounter: Payer: Self-pay | Admitting: Cardiovascular Disease

## 2020-05-21 ENCOUNTER — Ambulatory Visit: Payer: Medicare Other | Admitting: Cardiovascular Disease

## 2020-05-21 VITALS — BP 115/70 | HR 79 | Temp 97.3°F | Ht 68.0 in | Wt 207.0 lb

## 2020-05-21 DIAGNOSIS — G4733 Obstructive sleep apnea (adult) (pediatric): Secondary | ICD-10-CM | POA: Diagnosis not present

## 2020-05-21 DIAGNOSIS — I10 Essential (primary) hypertension: Secondary | ICD-10-CM

## 2020-05-21 DIAGNOSIS — Z9989 Dependence on other enabling machines and devices: Secondary | ICD-10-CM

## 2020-05-21 DIAGNOSIS — I700 Atherosclerosis of aorta: Secondary | ICD-10-CM | POA: Diagnosis not present

## 2020-05-21 DIAGNOSIS — E7800 Pure hypercholesterolemia, unspecified: Secondary | ICD-10-CM

## 2020-05-21 DIAGNOSIS — I2510 Atherosclerotic heart disease of native coronary artery without angina pectoris: Secondary | ICD-10-CM

## 2020-05-21 NOTE — Patient Instructions (Signed)
Medication Instructions:  Your physician recommends that you continue on your current medications as directed. Please refer to the Current Medication list given to you today.  *If you need a refill on your cardiac medications before your next appointment, please call your pharmacy*  Lab Work: NONE  Testing/Procedures: NONE  Follow-Up: At [facility], you and your health needs are our priority.  As part of our continuing mission to provide you with exceptional heart care, we have created designated Provider Care Teams.  These Care Teams include your primary Cardiologist (physician) and Advanced Practice Providers (APPs -  Physician Assistants and Nurse Practitioners) who all work together to provide you with the care you need, when you need it.  We recommend signing up for the patient portal called "MyChart".  Sign up information is provided on this After Visit Summary.  MyChart is used to connect with patients for Virtual Visits (Telemedicine).  Patients are able to view lab/test results, encounter notes, upcoming appointments, etc.  Non-urgent messages can be sent to your provider as well.   To learn more about what you can do with MyChart, go to [email or web address].    Your next appointment:   6 month(s)  You will receive a reminder letter in the mail two months in advance. If you don't receive a letter, please call our office to schedule the follow-up appointment.  The format for your next appointment:   In Person  Provider:   You may see Skeet Latch, MD or one of the following Advanced Practice Providers on your designated Care Team:    Kerin Ransom, PA-C  [place], [place]  Coletta Memos, [place]  Other Instructions  TRY TO EXERCISE [place]

## 2020-05-21 NOTE — Progress Notes (Signed)
Cardiology Office Note   Date:  05/21/2020   ID:  Jason Compton, DOB 05/10/1936, MRN [id number]  PCP:  Binnie Rail, MD  Cardiologist:   Skeet Latch, MD   No chief complaint on file.    History of Present Illness: Jason Compton is a 84 y.o. male with non-obstructive CAD, hypertension, hyperlipidemia, OSA on CPAP,and Celiac disease who presents for follow up. Jason Compton was previously a patient of Jason Compton. He continues to use his CPAP regularly.  He notes that he sleeps more soundly and has less nocturia on the CPAP machine. Jason Compton increased atorvastatin to 80mg .  He developed aching in his hips and arms.  He followed up with Jason Compton and started on Zetia.  He slowly has been titrating atorvastatin. He is now taking it four times per week. However he still had myalgias so we will switch to rosuvastatin. Since that time he has been doing well.  He reported increasing exertional dyspnea.  He had a Lexiscan Myoview 05/2019 that was negative for ischemia.    Jason Compton was recently diagnosed with prostate cancer.  He had core biopsy of the prostate and 2:16 samples were abnormal.  He is unsure what course of action he wants to take and will see Jason Compton soon.  He has been otherwise feeling well.  His blood pressure has been well-controlled. At his last appointment amlodipine was increased.  When he followed up with our pharmacist it was nearly controlled so no changes were made.  He has not been getting as much exercise lately.  [place] some renovations at home.  He has been active but no dedicated exercise.  He denies any chest pain or shortness of breath.  He denies lower extremity edema, orthopnea, or PND. He is planning a trip to [facility] soon.  He is excited to be leaving the country for the first time in a while.    Past Medical History:  Diagnosis Date  . Exogenous obesity   . GERD  (gastroesophageal reflux disease)   . History of hiatal hernia   . Hyperlipidemia   . Hypertension   . PVC's (premature ventricular contractions)   . Skin cancer     Past Surgical History:  Procedure Laterality Date  . CARDIOVASCULAR STRESS TEST  04/18/2009   NORMAL  . CATARACT EXTRACTION       Current Outpatient Medications  Medication Sig Dispense Refill  . amLODipine (NORVASC) 5 MG tablet Take 5 mg by mouth daily.    Marland Kitchen aspirin 81 MG tablet Take 81 mg by mouth daily.      . cholecalciferol (VITAMIN D) 1000 UNITS tablet Take 1,000 Units by mouth daily.     . cycloSPORINE (RESTASIS) 0.05 % ophthalmic emulsion Place 1 drop into both eyes 2 (two) times daily.    Marland Kitchen ezetimibe (ZETIA) 10 MG tablet TAKE 1 TABLET BY MOUTH  DAILY 90 tablet 2  . hydrochlorothiazide (HYDRODIURIL) 25 MG tablet Take 1 tablet (25 mg total) by mouth daily. 90 tablet 1  . Multiple Vitamin (MULTIVITAMIN) tablet Take 1 tablet by mouth daily.      Marland Kitchen omeprazole (PRILOSEC) 20 MG capsule Take 20 mg by mouth daily.     Marland Kitchen OVER THE COUNTER MEDICATION hydroEyes 2 tablets twice a day    . rosuvastatin (CRESTOR) 20 MG tablet TAKE 1 TABLET BY MOUTH 3  TIMES WEEKLY OR AS  DIRECTED 39 tablet 6  . triamcinolone cream (KENALOG) 0.1 % Apply 1 application topically 2 (two) times daily. 30 g 0  . vitamin C (ASCORBIC ACID) 500 MG tablet Take 500 mg by mouth daily.       No current facility-administered medications for this visit.    Allergies:   Altace [ramipril]    Social History:  The patient  reports that he has never smoked. He has never used smokeless tobacco. He reports current alcohol use. He reports that he does not use drugs.   Family History:  The patient's family history includes Cancer in his father; Hypertension in his brother; Osteoporosis in his mother.    ROS:  Please see the history of present illness.   Otherwise, review of systems are positive for none.   All other systems are reviewed and negative.     PHYSICAL EXAM: VS:  BP 115/70   Pulse 79   Temp (!) 97.3 F (36.3 C)   Ht 5\' 8"  (1.727 m)   Wt 207 lb (93.9 kg)   SpO2 95%   BMI 31.47 kg/m  , BMI Body mass index is 31.47 kg/m. GENERAL:  Well appearing HEENT: Pupils equal round and reactive, fundi not visualized, oral mucosa unremarkable NECK:  No jugular venous distention, waveform within normal limits, carotid upstroke brisk and symmetric, no bruits LUNGS:  Clear to auscultation bilaterally HEART:  RRR.  PMI not displaced or sustained,S1 and S2 within normal limits, no S3, no S4, no clicks, no rubs, no murmurs ABD:  Flat, positive bowel sounds normal in frequency in pitch, no bruits, no rebound, no guarding, no midline pulsatile mass, no hepatomegaly, no splenomegaly EXT:  2 plus pulses throughout, no edema, no cyanosis no clubbing SKIN:  No rashes no nodules NEURO:  Cranial nerves II through XII grossly intact, motor grossly intact throughout PSYCH:  Cognitively intact, oriented to person place and time  EKG:  EKG is not ordered today. The ekg ordered 03/30/16 demonstrates sinus rhythm rate 71 bpm.  Cannot rule out prior inferior infarct.  Left axis deviation.  Unchanged from prior.   11/19/16: Sinus rhythm. Rate 72 bpm. Low voltage limb leads. Prior inferior infarct 05/08/2018: Sinus rhythm.  LAFB.  Cannot rule out prior inferior infarct.  Low voltage. 11/28/19: Sinus rhythm.  Rate 63 bmp.  LAD.  Low voltage.  Prior inferior infarct.    Lexiscan Myoview 05/2019:  Nuclear stress EF: 62%.  The left ventricular ejection fraction is normal (55-65%).  There was no ST segment deviation noted during stress.  No T wave inversion was noted during stress.  Blood pressure demonstrated a hypertensive response to exercise.  The study is normal.  This is a low risk study.  Recent Labs: 10/31/2019: Hemoglobin 15.0; Platelets 183.0 05/06/2020: ALT 24 05/13/2020: BUN 11; Creatinine, Ser 0.86; Potassium 4.4; Sodium 133    Lipid  Panel    Component Value Date/Time   CHOL 166 03/07/2020 1217   TRIG 47 03/07/2020 1217   HDL 72 03/07/2020 1217   CHOLHDL 2.3 03/07/2020 1217   CHOLHDL 2 10/31/2019 1028   VLDL 10.6 10/31/2019 1028   LDLCALC 84 03/07/2020 1217   LDLDIRECT 189.7 12/10/2011 0901      Wt Readings from Last 3 Encounters:  05/21/20 207 lb (93.9 kg)  05/06/20 204 lb 12.8 oz (92.9 kg)  12/06/19 211 lb 9.6 oz (96 kg)      ASSESSMENT AND PLAN:  # Shortness of breath: Resolved.  Lexiscan Myoview was within  normal limits 05/2019.  # Hypertension:   # Hyponatremia:  Blood pressure well-controlled on amlodipine and hydrochlorothiazide.  We will repeat a basic metabolic panel at the next appointment.  # Hyperlipidemia.  LDL is above his goal of 70.  He is going to work on increasing his exercise.  Continue rosuvastatin and Zetia for now.  He had myalgias with higher doses of rosuvastatin.  # Coronary calcification:  Continue aspirin Zetia, rosuvastatin.   Current medicines are reviewed at length with the patient today.  The patient does not have concerns regarding medicines.  The following changes have been made: Increase amlodipine  Labs/ tests ordered today include:   No orders of the defined types were placed in this encounter.    Disposition:   FU with Icela Glymph C. Oval Linsey, MD, [facility] in 6 months.       Signed, Dannie Woolen C. Oval Linsey, MD, [facility]  05/21/2020 1:31 PM    [facility]

## 2020-06-07 DIAGNOSIS — Z20822 Contact with and (suspected) exposure to covid-19: Secondary | ICD-10-CM | POA: Diagnosis not present

## 2020-06-26 NOTE — Progress Notes (Signed)
GU Location of Tumor / Histology: prostatic adenocarcinoma  If Prostate Cancer, Gleason Score is (4 + 3) and PSA is (10). Prostate volume: 107 grams.  Jason Compton presented in 2009 with a PSA that increased to 6.2. He underwent a prostate biopsy August 2009. One biopsy revealed right lateral base a small focus of adenocarcinoma. Second opinion by Dr. Tommie Ard at [facility] called the GS 3+3. Due to the small volume of cancer, the patient was put on active surveillance.    Biopsies of prostate (if applicable) revealed:   Past/Anticipated interventions by urology, if any: prostate biopsy, active surveillance, referral for consideration of radiation therapy  Past/Anticipated interventions by medical oncology, if any: no  Weight changes, if any: denies  Bowel/Bladder complaints, if any: IPSS 11. SHIM 16. Denies dysuria or hematuria. Denies urinary leakage or incontinence. Denies any bowel complaints.   Nausea/Vomiting, if any: denies  Pain issues, if any:  denies  SAFETY ISSUES:  Prior radiation? denies  Pacemaker/ICD? denies  Possible current pregnancy? no, male patient  Is the patient on methotrexate? denies  Current Complaints / other details:  84 year old male. Married with 3 sons.

## 2020-06-27 ENCOUNTER — Other Ambulatory Visit: Payer: Self-pay

## 2020-06-27 ENCOUNTER — Ambulatory Visit
Admission: RE | Admit: 2020-06-27 | Discharge: 2020-06-27 | Disposition: A | Discharge status: Home / Self Care | Payer: Medicare Other | Source: Ambulatory Visit | Attending: Radiation Oncology | Admitting: Radiation Oncology

## 2020-06-27 ENCOUNTER — Encounter: Payer: Self-pay | Admitting: Radiation Oncology

## 2020-06-27 ENCOUNTER — Encounter: Payer: Self-pay | Admitting: Medical Oncology

## 2020-06-27 VITALS — BP 148/86 | HR 75 | Temp 97.6°F | Resp 20 | Wt 210.0 lb

## 2020-06-27 DIAGNOSIS — C61 Malignant neoplasm of prostate: Secondary | ICD-10-CM

## 2020-06-27 HISTORY — DX: Malignant neoplasm of prostate: C61

## 2020-06-27 NOTE — Progress Notes (Signed)
Radiation Oncology         [phone] ________________________________  Initial outpatient Consultation  Name: Jason Compton MRN: [id number]  Date: 06/27/2020  DOB: [birth year]  HW:EXHBZ, Claudina Lick, MD  Franchot Gallo, MD   REFERRING PHYSICIAN: Franchot Gallo, MD  DIAGNOSIS: 84 y.o. gentleman with Stage T1c adenocarcinoma of the prostate with Gleason score of 4+3, and PSA of 10.0.    ICD-10-CM   1. Malignant neoplasm of prostate ([place])  C61     HISTORY OF PRESENT ILLNESS: Jason Compton is a 84 y.o. male with a diagnosis of prostate cancer. He was initially diagnosed with prostate cancer in 03/2008 with a PSA of 5.77. Initial pathology showed Gleason 3+4 in 1/12 cores. The biopsy sample was sent to [facility] for a second opinion and felt to be low volume Gleason 3+3 in the positive core. As such, he was placed on active surveillance. He continued in follow up with repeat PSA which fluctuated over the years but remained relatively stable and a repeat prostate biopsy in 2011 showed HGPIN only.  An MRI prostate in February 2015 revealed a 3 mm lesion in the right lateral peripheral zone as well as a 12 mm lesion in the posterior mid central gland which was favored to be benign.  There was no evidence of extracapsular extension, lymphadenopathy or seminal vesicle involvement.  He did not have further urology follow-up until October 2019 when his PSA was slightly further elevated at 7.85.  A repeat prostate MRI on 12/14/2018 demonstrated a 5 to 7 mm PI-RADS 3 lesion in the left anterior mid peripheral zone.  He elected to continue in active surveillance but did not have further urologic follow-up until July 2021 and PSA at that time was further elevated at 10.0.  A repeat prostate MRI on 04/17/2020 which showed a 1.4 cm PI-RADS 4 lesion in the left medial apical peripheral zone, increased in size from previous study but no evidence of extracapsular extension or metastatic disease in the  pelvis.  He proceeded to MRI fusion biopsy on 05/14/2020.  The prostate volume measured 107 cc.  Out of 16 core biopsies, 2 were positive.  The maximum Gleason score was 4+3, and this was seen in left base and left mid.  All 4 of 4 samples from the region of interest on MRI were negative.  The patient reviewed the biopsy results with his urologist and he has kindly been referred today for discussion of potential radiation treatment options.   PREVIOUS RADIATION THERAPY: No  PAST MEDICAL HISTORY:  Past Medical History:  Diagnosis Date  . Exogenous obesity   . GERD (gastroesophageal reflux disease)   . History of hiatal hernia   . Hyperlipidemia   . Hypertension   . Prostate cancer ([place])   . PVC's (premature ventricular contractions)   . Skin cancer       PAST SURGICAL HISTORY: Past Surgical History:  Procedure Laterality Date  . CARDIOVASCULAR STRESS TEST  04/18/2009   NORMAL  . CATARACT EXTRACTION      FAMILY HISTORY:  Family History  Problem Relation Age of Onset  . Osteoporosis Mother   . Cancer Father        skin  . Skin cancer Father   . Hypertension Brother   . Skin cancer Brother   . Breast cancer Neg Hx   . Colon cancer Neg Hx   . Pancreatic cancer Neg Hx   . Prostate cancer Neg Hx     SOCIAL  HISTORY:  Social History   Socioeconomic History  . Marital status: Married    Spouse name: Not on file  . Number of children: Not on file  . Years of education: Not on file  . Highest education level: Not on file  Occupational History  . Occupation: Chief Financial Officer    Comment: retired  Tobacco Use  . Smoking status: Never Smoker  . Smokeless tobacco: Never Used  Vaping Use  . Vaping Use: Never used  Substance and Sexual Activity  . Alcohol use: Yes    Alcohol/week: 0.0 standard drinks    Comment: wine  . Drug use: No  . Sexual activity: Yes  Other Topics Concern  . Not on file  Social History Narrative  . Not on file   Social Determinants of Health    Financial Resource Strain:   . Difficulty of Paying Living Expenses: Not on file  Food Insecurity:   . Worried About Charity fundraiser in the Last Year: Not on file  . Ran Out of Food in the Last Year: Not on file  Transportation Needs:   . Lack of Transportation (Medical): Not on file  . Lack of Transportation (Non-Medical): Not on file  Physical Activity:   . Days of Exercise per Week: Not on file  . Minutes of Exercise per Session: Not on file  Stress:   . Feeling of Stress : Not on file  Social Connections:   . Frequency of Communication with Friends and Family: Not on file  . Frequency of Social Gatherings with Friends and Family: Not on file  . Attends Religious Services: Not on file  . Active Member of Clubs or Organizations: Not on file  . Attends Archivist Meetings: Not on file  . Marital Status: Not on file  Intimate Partner Violence:   . Fear of Current or Ex-Partner: Not on file  . Emotionally Abused: Not on file  . Physically Abused: Not on file  . Sexually Abused: Not on file    ALLERGIES: Altace [ramipril]  MEDICATIONS:  Current Outpatient Medications  Medication Sig Dispense Refill  . amLODipine (NORVASC) 5 MG tablet Take 5 mg by mouth daily.    Marland Kitchen aspirin 81 MG tablet Take 81 mg by mouth daily.      . cholecalciferol (VITAMIN D) 1000 UNITS tablet Take 1,000 Units by mouth daily.     . cycloSPORINE (RESTASIS) 0.05 % ophthalmic emulsion Place 1 drop into both eyes 2 (two) times daily.    Marland Kitchen ezetimibe (ZETIA) 10 MG tablet TAKE 1 TABLET BY MOUTH  DAILY 90 tablet 2  . hydrochlorothiazide (HYDRODIURIL) 25 MG tablet Take 1 tablet (25 mg total) by mouth daily. 90 tablet 1  . Multiple Vitamin (MULTIVITAMIN) tablet Take 1 tablet by mouth daily.      Marland Kitchen omeprazole (PRILOSEC) 20 MG capsule Take 20 mg by mouth daily.     Marland Kitchen OVER THE COUNTER MEDICATION hydroEyes 2 tablets twice a day    . rosuvastatin (CRESTOR) 20 MG tablet TAKE 1 TABLET BY MOUTH 3  TIMES  WEEKLY OR AS DIRECTED 39 tablet 6  . triamcinolone cream (KENALOG) 0.1 % Apply 1 application topically 2 (two) times daily. 30 g 0  . vitamin C (ASCORBIC ACID) 500 MG tablet Take 500 mg by mouth daily.       No current facility-administered medications for this encounter.    REVIEW OF SYSTEMS:  On review of systems, the patient reports that he is doing well  overall. He denies any chest pain, shortness of breath, cough, fevers, chills, night sweats, unintended weight changes. He denies any bowel disturbances, and denies abdominal pain, nausea or vomiting. He denies any new musculoskeletal or joint aches or pains. His IPSS was 11, indicating moderate urinary symptoms. His SHIM was 16, indicating he has moderate erectile dysfunction. A complete review of systems is obtained and is otherwise negative.    PHYSICAL EXAM:  Wt Readings from Last 3 Encounters:  06/27/20 210 lb (95.3 kg)  05/21/20 207 lb (93.9 kg)  05/06/20 204 lb 12.8 oz (92.9 kg)   Temp Readings from Last 3 Encounters:  06/27/20 97.6 F (36.4 C) (Oral)  05/21/20 (!) 97.3 F (36.3 C)  05/06/20 98.8 F (37.1 C) (Oral)   BP Readings from Last 3 Encounters:  06/27/20 (!) 148/86  05/21/20 115/70  05/13/20 126/86   Pulse Readings from Last 3 Encounters:  06/27/20 75  05/21/20 79  05/13/20 69   Pain Assessment Pain Score: 0-No pain/10  In general this is a well appearing Caucasian male in no acute distress. He is alert and oriented x4 and appropriate throughout the examination. HEENT reveals that the patient is normocephalic, atraumatic. EOMs are intact. PERRLA. Skin is intact without any evidence of gross lesions. Cardiopulmonary assessment is negative for acute distress and he exhibits normal effort. The abdomen is soft, non tender, non distended. Lower extremities are negative for pretibial pitting edema, deep calf tenderness, cyanosis or clubbing.   KPS = 100  100 - Normal; no complaints; no evidence of disease. 90    - Able to carry on normal activity; minor signs or symptoms of disease. 80   - Normal activity with effort; some signs or symptoms of disease. 42   - Cares for self; unable to carry on normal activity or to do active work. 60   - Requires occasional assistance, but is able to care for most of his personal needs. 50   - Requires considerable assistance and frequent medical care. 61   - Disabled; requires special care and assistance. 27   - Severely disabled; hospital admission is indicated although death not imminent. 69   - Very sick; hospital admission necessary; active supportive treatment necessary. 10   - Moribund; fatal processes progressing rapidly. 0     - Dead  Karnofsky DA, Abelmann [place], Craver LS and Burchenal [facility] [phone]) The use of the nitrogen mustards in the palliative treatment of carcinoma: with particular reference to bronchogenic carcinoma Cancer 1 634-56  LABORATORY DATA:  Lab Results  Component Value Date   WBC 4.3 10/31/2019   HGB 15.0 10/31/2019   HCT 45.1 10/31/2019   MCV 100.2 (H) 10/31/2019   PLT 183.0 10/31/2019   Lab Results  Component Value Date   NA 133 (L) 05/13/2020   K 4.4 05/13/2020   CL 95 (L) 05/13/2020   CO2 26 05/13/2020   Lab Results  Component Value Date   ALT 24 05/06/2020   AST 21 05/06/2020   ALKPHOS 54 10/31/2019   BILITOT 1.1 05/06/2020     RADIOGRAPHY: No results found.    IMPRESSION/PLAN: 1. 84 y.o. gentleman with Stage T1cc adenocarcinoma of the prostate with Gleason score of 4+3, and PSA of 10.0. We discussed the patient's workup and outlined the nature of prostate cancer in this setting. The patient's T stage, Gleason's score, and PSA put him into the unfavorable intermediate risk group. Accordingly, he is eligible for a variety of potential treatment options including brachytherapy,  5.5 weeks of external radiation, or prostatectomy. We discussed the available radiation techniques, and focused on the details and logistics of  delivery. The patient is not an ideal candidate for brachytherapy with a prostate volume of 107 cc.  Therefore, we discussed and outlined the risks, benefits, short and long-term effects associated with external beam radiotherapy and compared and contrasted these with prostatectomy. We discussed the role of SpaceOAR gel in reducing the rectal toxicity associated with radiotherapy.  He and his wife were encouraged to ask questions that were answered to their stated satisfaction.  At the conclusion of our conversation, the patient is interested in moving forward with 5.5 weeks of external beam therapy but prefers to delay the start of treatment until he returns from a planned family visit to to see their new grandbaby and a trip to [place] for Christmas. We will share our discussion with Dr. Diona Fanti and make arrangements for fiducial markers and SpaceOAR gel placement, prior to CT simulation, to reduce rectal toxicity from radiotherapy. The patient appears to have a good understanding of his disease and our treatment recommendations which are of curative intent and is in agreement with the stated plan. Therefore, we will move forward with treatment planning accordingly, in anticipation of beginning IMRT in late December 2021 or early January 2022, according to patient preference.    Nicholos Johns, PA-C    Tyler Pita, MD  [facility] Direct Dial: [phone]  Fax: [phone] [email or web address]  Skype  LinkedIn   This document serves as a record of services personally performed by Tyler Pita, MD and Freeman Caldron, PA-C. It was created on their behalf by Wilburn Mylar, a trained medical scribe. The creation of this record is based on the scribe's personal observations and the provider's statements to them. This document has been checked and approved by the attending provider.

## 2020-06-27 NOTE — Progress Notes (Signed)
Introduced myself to patient and his wife as the prostate nurse navigator and dicussed my role. He is here  to discuss his radiation treatment options. No barriers to care identified at this time. I gave him my contact information and asked him to call me with questions or concerns.

## 2020-06-29 DIAGNOSIS — C61 Malignant neoplasm of prostate: Secondary | ICD-10-CM | POA: Insufficient documentation

## 2020-06-30 ENCOUNTER — Encounter: Payer: Self-pay | Admitting: Medical Oncology

## 2020-06-30 ENCOUNTER — Telehealth: Payer: Self-pay | Admitting: *Deleted

## 2020-06-30 DIAGNOSIS — L821 Other seborrheic keratosis: Secondary | ICD-10-CM | POA: Diagnosis not present

## 2020-06-30 DIAGNOSIS — L570 Actinic keratosis: Secondary | ICD-10-CM | POA: Diagnosis not present

## 2020-06-30 DIAGNOSIS — D1801 Hemangioma of skin and subcutaneous tissue: Secondary | ICD-10-CM | POA: Diagnosis not present

## 2020-06-30 DIAGNOSIS — Z85828 Personal history of other malignant neoplasm of skin: Secondary | ICD-10-CM | POA: Diagnosis not present

## 2020-06-30 NOTE — Progress Notes (Signed)
Malachy Mood- wife, called stating she called [facility] this morning to schedule placement of gold markers. She was told Dr. Tammi Klippel 's office needs to contact them. I explained Enid Derry will schedule theses appointments  and contact them. According to the office note, patient has requested treatment not start until late December or early January due to planned trips. She states he has decided to move forward with treatment and not wait. He also has dates that he would like for markers to be placed and begin treatment. I explained that appointments will be based on availability and treatment plans. I will ask Enid Derry to contact her to discuss dates before scheduling. She voiced understanding and appreciation.

## 2020-06-30 NOTE — Telephone Encounter (Signed)
RETURNED PATIENT'S WIFE PHONE CALL- SPOKE WITH PATIENT'S WIFE

## 2020-07-01 ENCOUNTER — Ambulatory Visit: Payer: Medicare Other

## 2020-07-01 ENCOUNTER — Encounter: Payer: Self-pay | Admitting: Licensed Clinical Social Worker

## 2020-07-01 NOTE — Progress Notes (Signed)
[place] Psychosocial Distress Screening Clinical Social Work  Clinical Social Work was referred by distress screening protocol.  The patient scored a 5 on the Psychosocial Distress Thermometer which indicates moderate distress. Clinical Social Worker contacted patient by phone to assess for distress and other psychosocial needs.  Patient reports feeling low distress after having questions answered by medical team. He is just ready to get started and move forward with treatment so he can be healthy again for a vacation he and his wife have planned for March. No housing, food, or transportation needs at this time.  ONCBCN DISTRESS SCREENING 06/27/2020  Screening Type Initial Screening  Distress experienced in past week (1-10) 5  Practical problem type Transportation  Physician notified of physical symptoms Yes  Referral to clinical psychology No  Referral to clinical social work No  Referral to dietition No  Referral to financial advocate No  Referral to support programs Yes  Referral to palliative care No    Clinical Social Worker follow up needed: No.  If yes, follow up plan:  MICHELLE E ZAVALA, LCSW

## 2020-07-04 ENCOUNTER — Other Ambulatory Visit: Payer: Self-pay | Admitting: Urology

## 2020-07-04 DIAGNOSIS — C61 Malignant neoplasm of prostate: Secondary | ICD-10-CM

## 2020-07-07 ENCOUNTER — Telehealth: Payer: Self-pay | Admitting: *Deleted

## 2020-07-07 NOTE — Telephone Encounter (Signed)
RETURNED PATIENT'S PHONE CALL, SPOKE WITH PATIENT. ?

## 2020-07-25 ENCOUNTER — Telehealth: Payer: Self-pay | Admitting: Internal Medicine

## 2020-07-25 NOTE — Progress Notes (Signed)
  Chronic Care Management   Outreach Note  07/25/2020 Name: Jason Compton MRN: [id number] DOB: 12-17-1935  Referred by: Binnie Rail, MD Reason for referral : No chief complaint on file.   An unsuccessful telephone outreach was attempted today. The patient was referred to the pharmacist for assistance with care management and care coordination.   Follow Up Plan:   Carley Perdue [employer] Scheduler

## 2020-07-29 ENCOUNTER — Telehealth: Payer: Self-pay | Admitting: Internal Medicine

## 2020-07-29 NOTE — Progress Notes (Signed)
  Chronic Care Management   Note  07/29/2020 Name: Jason Compton MRN: [id number] DOB: 15-Nov-1935  Jason Compton is a 84 y.o. year old male who is a primary care patient of Burns, Claudina Lick, MD. I reached out to Jason Compton by phone Compton in response to a referral sent by Mr. Jason Compton's PCP, Binnie Rail, MD.   Jason Compton including:  1. CCM service includes personalized support from designated clinical staff supervised by his physician, including individualized plan of care and coordination with other care providers 2. 24/7 contact phone numbers for assistance for urgent and routine care needs. 3. Service will only be billed when office clinical staff spend 20 minutes or more in a month to coordinate care. 4. Only one practitioner may furnish and bill the service in a calendar month. 5. The patient may stop CCM services at any time (effective at the end of the month) by phone call to the office staff.   Patient did not agree to enrollment in care management services and does not wish to consider at this time.  Follow up plan:   Jason Compton [employer] Scheduler

## 2020-08-01 ENCOUNTER — Telehealth: Payer: Self-pay | Admitting: *Deleted

## 2020-08-01 NOTE — Telephone Encounter (Signed)
RETURNED PATIENT'S PHONE CALL, SPOKE WITH PATIENT. ?

## 2020-08-04 ENCOUNTER — Encounter: Payer: Self-pay | Admitting: Internal Medicine

## 2020-08-04 ENCOUNTER — Telehealth (INDEPENDENT_AMBULATORY_CARE_PROVIDER_SITE_OTHER): Payer: Medicare Other | Admitting: Internal Medicine

## 2020-08-04 ENCOUNTER — Telehealth: Payer: Self-pay | Admitting: *Deleted

## 2020-08-04 DIAGNOSIS — R059 Cough, unspecified: Secondary | ICD-10-CM | POA: Diagnosis not present

## 2020-08-04 MED ORDER — HYDROCOD POLST-CPM POLST ER 10-8 MG/5ML PO SUER
5.0000 mL | Freq: Two times a day (BID) | ORAL | 0 refills | Status: DC | PRN
Start: 2020-08-04 — End: 2020-11-17

## 2020-08-04 NOTE — Telephone Encounter (Signed)
CALLED PATIENT TO REMIND OF SIM AND MRI FOR 08-05-20, SPOKE WITH PATIENT AND HE IS AWARE OF THESE APPTS.

## 2020-08-04 NOTE — Progress Notes (Signed)
Virtual Visit via Video Note  I connected with Jason Compton on 08/04/20 at  3:30 PM EST by a video enabled telemedicine application and verified that I am speaking with the correct person using two identifiers.   I discussed the limitations of evaluation and management by telemedicine and the availability of in person appointments. The patient expressed understanding and agreed to proceed.  Present for the visit:  Myself, Dr Billey Gosling, Angelita Ingles in his wife Jason Compton.  The patient is currently at home and I am in the office.    No referring provider.    History of Present Illness: This is an acute visit for cough.  2-3 weeks ago he had typical cold symptoms.  At that time he did take a Covid test and it was negative.  He had coughing, postnasal drip.  He never had a fever.  The cold symptoms went away, but since then he has a lingering dry cough.  He also has a tightness in his throat-not really a sore throat, just tightness.  The cough is really only bothersome at night.  He uses CPAP and when he lays down he may cough a few times and then be fine throughout the night.  He denies any wheezing or shortness of breath.  The cough is slowly improving.  He otherwise feels fine.   Review of Systems  Constitutional: Negative for fever.  HENT: Negative for sore throat (tightness in chest).        No PND  Respiratory: Positive for cough (dry - worse at night). Negative for shortness of breath and wheezing.   Neurological: Negative for dizziness and headaches.     Social History   Socioeconomic History  . Marital status: Married    Spouse name: Not on file  . Number of children: Not on file  . Years of education: Not on file  . Highest education level: Not on file  Occupational History  . Occupation: Chief Financial Officer    Comment: retired  Tobacco Use  . Smoking status: Never Smoker  . Smokeless tobacco: Never Used  Vaping Use  . Vaping Use: Never used  Substance and Sexual Activity   . Alcohol use: Yes    Alcohol/week: 0.0 standard drinks    Comment: wine  . Drug use: No  . Sexual activity: Yes  Other Topics Concern  . Not on file  Social History Narrative  . Not on file   Social Determinants of Health   Financial Resource Strain: Not on file  Food Insecurity: Not on file  Transportation Needs: Not on file  Physical Activity: Not on file  Stress: Not on file  Social Connections: Not on file     Observations/Objective: Appears well in NAD Breathing normally Skin appears warm and dry  Assessment and Plan:  See Problem List for Assessment and Plan of chronic medical problems.   Follow Up Instructions:    I discussed the assessment and treatment plan with the patient. The patient was provided an opportunity to ask questions and all were answered. The patient agreed with the plan and demonstrated an understanding of the instructions.   The patient was advised to call back or seek an in-person evaluation if the symptoms worsen or if the condition fails to improve as anticipated.    Binnie Rail, MD

## 2020-08-04 NOTE — Assessment & Plan Note (Signed)
Acute Post viral in nature No evidence of active viral or bacterial infection His cough is improving Discussed symptomatic treatment only Can take Mucinex Will prescribe Tussionex cough syrup Cough is relatively mild-no need for steroids at this time and he would like to avoid Call if no improvement

## 2020-08-05 ENCOUNTER — Ambulatory Visit
Admission: RE | Admit: 2020-08-05 | Discharge: 2020-08-05 | Disposition: A | Discharge status: Home / Self Care | Payer: Medicare Other | Source: Ambulatory Visit | Attending: Radiation Oncology | Admitting: Radiation Oncology

## 2020-08-05 ENCOUNTER — Ambulatory Visit (HOSPITAL_COMMUNITY)
Admission: RE | Admit: 2020-08-05 | Discharge: 2020-08-05 | Disposition: A | Discharge status: Home / Self Care | Payer: Medicare Other | Source: Ambulatory Visit | Attending: Urology | Admitting: Urology

## 2020-08-05 ENCOUNTER — Other Ambulatory Visit: Payer: Self-pay

## 2020-08-05 ENCOUNTER — Encounter: Payer: Self-pay | Admitting: Medical Oncology

## 2020-08-05 DIAGNOSIS — Z510 Encounter for antineoplastic radiation therapy: Secondary | ICD-10-CM | POA: Insufficient documentation

## 2020-08-05 DIAGNOSIS — C61 Malignant neoplasm of prostate: Secondary | ICD-10-CM | POA: Diagnosis not present

## 2020-08-05 NOTE — Progress Notes (Signed)
  Radiation Oncology         [phone]) [phone] ________________________________  Name: Jason Compton MRN: [id number]  Date: 08/05/2020  DOB: Mar 15, 1936  SIMULATION AND TREATMENT PLANNING NOTE    ICD-10-CM   1. Malignant neoplasm of prostate ([place])  C61     DIAGNOSIS:  84 y.o. gentleman with Stage T1c adenocarcinoma of the prostate with Gleason score of 4+3, and PSA of 10.0.  NARRATIVE:  The patient was brought to the [facility].  Identity was confirmed.  All relevant records and images related to the planned course of therapy were reviewed.  The patient freely provided informed written consent to proceed with treatment after reviewing the details related to the planned course of therapy. The consent form was witnessed and verified by the simulation staff.  Then, the patient was set-up in a stable reproducible supine position for radiation therapy.  A vacuum lock pillow device was custom fabricated to position his legs in a reproducible immobilized position.  Then, I performed a urethrogram under sterile conditions to identify the prostatic apex.  CT images were obtained.  Surface markings were placed.  The CT images were loaded into the planning software.  Then the prostate target and avoidance structures including the rectum, bladder, bowel and hips were contoured.  Treatment planning then occurred.  The radiation prescription was entered and confirmed.  A total of one complex treatment devices was fabricated. I have requested : Intensity Modulated Radiotherapy (IMRT) is medically necessary for this case for the following reason:  Rectal sparing.Marland Kitchen  PLAN:  The patient will receive 70 Gy in 28 fractions.  ________________________________  Sheral Apley Tammi Klippel, M.D.

## 2020-08-08 DIAGNOSIS — Z510 Encounter for antineoplastic radiation therapy: Secondary | ICD-10-CM | POA: Diagnosis not present

## 2020-08-11 DIAGNOSIS — G4733 Obstructive sleep apnea (adult) (pediatric): Secondary | ICD-10-CM | POA: Diagnosis not present

## 2020-08-12 ENCOUNTER — Ambulatory Visit
Admission: RE | Admit: 2020-08-12 | Discharge: 2020-08-12 | Disposition: A | Discharge status: Home / Self Care | Payer: Medicare Other | Source: Ambulatory Visit | Attending: Radiation Oncology | Admitting: Radiation Oncology

## 2020-08-12 ENCOUNTER — Other Ambulatory Visit: Payer: Self-pay

## 2020-08-12 DIAGNOSIS — Z510 Encounter for antineoplastic radiation therapy: Secondary | ICD-10-CM | POA: Diagnosis not present

## 2020-08-12 MED ORDER — AMLODIPINE BESYLATE 5 MG PO TABS: 5.0000 mg | ORAL_TABLET | Freq: Every day | ORAL | 3 refills | Status: DC

## 2020-08-13 ENCOUNTER — Ambulatory Visit
Admission: RE | Admit: 2020-08-13 | Discharge: 2020-08-13 | Disposition: A | Discharge status: Home / Self Care | Payer: Medicare Other | Source: Ambulatory Visit | Attending: Radiation Oncology | Admitting: Radiation Oncology

## 2020-08-13 ENCOUNTER — Other Ambulatory Visit: Payer: Self-pay

## 2020-08-13 ENCOUNTER — Ambulatory Visit: Payer: Medicare Other

## 2020-08-13 DIAGNOSIS — Z510 Encounter for antineoplastic radiation therapy: Secondary | ICD-10-CM | POA: Diagnosis not present

## 2020-08-14 ENCOUNTER — Ambulatory Visit
Admission: RE | Admit: 2020-08-14 | Discharge: 2020-08-14 | Disposition: A | Discharge status: Home / Self Care | Payer: Medicare Other | Source: Ambulatory Visit | Attending: Radiation Oncology | Admitting: Radiation Oncology

## 2020-08-14 ENCOUNTER — Encounter: Payer: Self-pay | Admitting: Medical Oncology

## 2020-08-14 ENCOUNTER — Ambulatory Visit: Payer: Medicare Other

## 2020-08-14 ENCOUNTER — Other Ambulatory Visit: Payer: Self-pay

## 2020-08-14 DIAGNOSIS — Z510 Encounter for antineoplastic radiation therapy: Secondary | ICD-10-CM | POA: Diagnosis not present

## 2020-08-18 ENCOUNTER — Ambulatory Visit: Payer: Medicare Other

## 2020-08-19 ENCOUNTER — Ambulatory Visit
Admission: RE | Admit: 2020-08-19 | Discharge: 2020-08-19 | Disposition: A | Discharge status: Home / Self Care | Payer: Medicare Other | Source: Ambulatory Visit | Attending: Radiation Oncology | Admitting: Radiation Oncology

## 2020-08-19 DIAGNOSIS — Z510 Encounter for antineoplastic radiation therapy: Secondary | ICD-10-CM | POA: Diagnosis not present

## 2020-08-20 ENCOUNTER — Ambulatory Visit
Admission: RE | Admit: 2020-08-20 | Discharge: 2020-08-20 | Disposition: A | Discharge status: Home / Self Care | Payer: Medicare Other | Source: Ambulatory Visit | Attending: Radiation Oncology | Admitting: Radiation Oncology

## 2020-08-20 DIAGNOSIS — Z510 Encounter for antineoplastic radiation therapy: Secondary | ICD-10-CM | POA: Diagnosis not present

## 2020-08-21 ENCOUNTER — Ambulatory Visit
Admission: RE | Admit: 2020-08-21 | Discharge: 2020-08-21 | Disposition: A | Discharge status: Home / Self Care | Payer: Medicare Other | Source: Ambulatory Visit | Attending: Radiation Oncology | Admitting: Radiation Oncology

## 2020-08-21 DIAGNOSIS — Z510 Encounter for antineoplastic radiation therapy: Secondary | ICD-10-CM | POA: Diagnosis not present

## 2020-08-25 ENCOUNTER — Other Ambulatory Visit: Payer: Self-pay

## 2020-08-25 ENCOUNTER — Ambulatory Visit
Admission: RE | Admit: 2020-08-25 | Discharge: 2020-08-25 | Disposition: A | Discharge status: Home / Self Care | Payer: Medicare Other | Source: Ambulatory Visit | Attending: Radiation Oncology | Admitting: Radiation Oncology

## 2020-08-25 DIAGNOSIS — Z510 Encounter for antineoplastic radiation therapy: Secondary | ICD-10-CM | POA: Diagnosis not present

## 2020-08-25 DIAGNOSIS — C61 Malignant neoplasm of prostate: Secondary | ICD-10-CM | POA: Insufficient documentation

## 2020-08-26 ENCOUNTER — Other Ambulatory Visit: Payer: Self-pay | Admitting: Cardiovascular Disease

## 2020-08-26 ENCOUNTER — Ambulatory Visit
Admission: RE | Admit: 2020-08-26 | Discharge: 2020-08-26 | Disposition: A | Discharge status: Home / Self Care | Payer: Medicare Other | Source: Ambulatory Visit | Attending: Radiation Oncology | Admitting: Radiation Oncology

## 2020-08-26 ENCOUNTER — Other Ambulatory Visit: Payer: Self-pay

## 2020-08-26 DIAGNOSIS — I10 Essential (primary) hypertension: Secondary | ICD-10-CM

## 2020-08-26 DIAGNOSIS — Z510 Encounter for antineoplastic radiation therapy: Secondary | ICD-10-CM | POA: Diagnosis not present

## 2020-08-27 ENCOUNTER — Ambulatory Visit
Admission: RE | Admit: 2020-08-27 | Discharge: 2020-08-27 | Disposition: A | Discharge status: Home / Self Care | Payer: Medicare Other | Source: Ambulatory Visit | Attending: Radiation Oncology | Admitting: Radiation Oncology

## 2020-08-27 DIAGNOSIS — Z510 Encounter for antineoplastic radiation therapy: Secondary | ICD-10-CM | POA: Diagnosis not present

## 2020-08-28 ENCOUNTER — Ambulatory Visit
Admission: RE | Admit: 2020-08-28 | Discharge: 2020-08-28 | Disposition: A | Discharge status: Home / Self Care | Payer: Medicare Other | Source: Ambulatory Visit | Attending: Radiation Oncology | Admitting: Radiation Oncology

## 2020-08-28 DIAGNOSIS — Z510 Encounter for antineoplastic radiation therapy: Secondary | ICD-10-CM | POA: Diagnosis not present

## 2020-08-29 ENCOUNTER — Ambulatory Visit
Admission: RE | Admit: 2020-08-29 | Discharge: 2020-08-29 | Disposition: A | Discharge status: Home / Self Care | Payer: Medicare Other | Source: Ambulatory Visit | Attending: Radiation Oncology | Admitting: Radiation Oncology

## 2020-08-29 ENCOUNTER — Other Ambulatory Visit: Payer: Self-pay

## 2020-08-29 DIAGNOSIS — Z510 Encounter for antineoplastic radiation therapy: Secondary | ICD-10-CM | POA: Diagnosis not present

## 2020-09-01 ENCOUNTER — Ambulatory Visit
Admission: RE | Admit: 2020-09-01 | Discharge: 2020-09-01 | Disposition: A | Discharge status: Home / Self Care | Payer: Medicare Other | Source: Ambulatory Visit | Attending: Radiation Oncology | Admitting: Radiation Oncology

## 2020-09-01 DIAGNOSIS — Z510 Encounter for antineoplastic radiation therapy: Secondary | ICD-10-CM | POA: Diagnosis not present

## 2020-09-02 ENCOUNTER — Ambulatory Visit
Admission: RE | Admit: 2020-09-02 | Discharge: 2020-09-02 | Disposition: A | Discharge status: Home / Self Care | Payer: Medicare Other | Source: Ambulatory Visit | Attending: Radiation Oncology | Admitting: Radiation Oncology

## 2020-09-02 DIAGNOSIS — Z510 Encounter for antineoplastic radiation therapy: Secondary | ICD-10-CM | POA: Diagnosis not present

## 2020-09-03 ENCOUNTER — Ambulatory Visit
Admission: RE | Admit: 2020-09-03 | Discharge: 2020-09-03 | Disposition: A | Discharge status: Home / Self Care | Payer: Medicare Other | Source: Ambulatory Visit | Attending: Radiation Oncology | Admitting: Radiation Oncology

## 2020-09-03 DIAGNOSIS — Z510 Encounter for antineoplastic radiation therapy: Secondary | ICD-10-CM | POA: Diagnosis not present

## 2020-09-04 ENCOUNTER — Ambulatory Visit
Admission: RE | Admit: 2020-09-04 | Discharge: 2020-09-04 | Disposition: A | Discharge status: Home / Self Care | Payer: Medicare Other | Source: Ambulatory Visit | Attending: Radiation Oncology | Admitting: Radiation Oncology

## 2020-09-04 DIAGNOSIS — Z510 Encounter for antineoplastic radiation therapy: Secondary | ICD-10-CM | POA: Diagnosis not present

## 2020-09-05 ENCOUNTER — Other Ambulatory Visit: Payer: Self-pay

## 2020-09-05 ENCOUNTER — Ambulatory Visit
Admission: RE | Admit: 2020-09-05 | Discharge: 2020-09-05 | Disposition: A | Discharge status: Home / Self Care | Payer: Medicare Other | Source: Ambulatory Visit | Attending: Radiation Oncology | Admitting: Radiation Oncology

## 2020-09-05 DIAGNOSIS — Z510 Encounter for antineoplastic radiation therapy: Secondary | ICD-10-CM | POA: Diagnosis not present

## 2020-09-06 ENCOUNTER — Ambulatory Visit: Payer: Medicare Other

## 2020-09-08 ENCOUNTER — Ambulatory Visit
Admission: RE | Admit: 2020-09-08 | Discharge: 2020-09-08 | Disposition: A | Discharge status: Home / Self Care | Payer: Medicare Other | Source: Ambulatory Visit | Attending: Radiation Oncology | Admitting: Radiation Oncology

## 2020-09-08 ENCOUNTER — Other Ambulatory Visit: Payer: Self-pay

## 2020-09-08 DIAGNOSIS — Z510 Encounter for antineoplastic radiation therapy: Secondary | ICD-10-CM | POA: Diagnosis not present

## 2020-09-09 ENCOUNTER — Other Ambulatory Visit: Payer: Self-pay | Admitting: Internal Medicine

## 2020-09-09 ENCOUNTER — Ambulatory Visit
Admission: RE | Admit: 2020-09-09 | Discharge: 2020-09-09 | Disposition: A | Discharge status: Home / Self Care | Payer: Medicare Other | Source: Ambulatory Visit | Attending: Radiation Oncology | Admitting: Radiation Oncology

## 2020-09-09 ENCOUNTER — Other Ambulatory Visit: Payer: Self-pay

## 2020-09-09 DIAGNOSIS — Z510 Encounter for antineoplastic radiation therapy: Secondary | ICD-10-CM | POA: Diagnosis not present

## 2020-09-10 ENCOUNTER — Other Ambulatory Visit: Payer: Self-pay

## 2020-09-10 ENCOUNTER — Ambulatory Visit
Admission: RE | Admit: 2020-09-10 | Discharge: 2020-09-10 | Disposition: A | Discharge status: Home / Self Care | Payer: Medicare Other | Source: Ambulatory Visit | Attending: Radiation Oncology | Admitting: Radiation Oncology

## 2020-09-10 DIAGNOSIS — Z510 Encounter for antineoplastic radiation therapy: Secondary | ICD-10-CM | POA: Diagnosis not present

## 2020-09-11 ENCOUNTER — Ambulatory Visit
Admission: RE | Admit: 2020-09-11 | Discharge: 2020-09-11 | Disposition: A | Discharge status: Home / Self Care | Payer: Medicare Other | Source: Ambulatory Visit | Attending: Radiation Oncology | Admitting: Radiation Oncology

## 2020-09-11 ENCOUNTER — Other Ambulatory Visit: Payer: Self-pay

## 2020-09-11 DIAGNOSIS — Z510 Encounter for antineoplastic radiation therapy: Secondary | ICD-10-CM | POA: Diagnosis not present

## 2020-09-12 ENCOUNTER — Ambulatory Visit
Admission: RE | Admit: 2020-09-12 | Discharge: 2020-09-12 | Disposition: A | Discharge status: Home / Self Care | Payer: Medicare Other | Source: Ambulatory Visit | Attending: Radiation Oncology | Admitting: Radiation Oncology

## 2020-09-12 ENCOUNTER — Other Ambulatory Visit: Payer: Self-pay

## 2020-09-12 DIAGNOSIS — Z510 Encounter for antineoplastic radiation therapy: Secondary | ICD-10-CM | POA: Diagnosis not present

## 2020-09-15 ENCOUNTER — Ambulatory Visit
Admission: RE | Admit: 2020-09-15 | Discharge: 2020-09-15 | Disposition: A | Discharge status: Home / Self Care | Payer: Medicare Other | Source: Ambulatory Visit | Attending: Radiation Oncology | Admitting: Radiation Oncology

## 2020-09-15 DIAGNOSIS — Z510 Encounter for antineoplastic radiation therapy: Secondary | ICD-10-CM | POA: Diagnosis not present

## 2020-09-16 ENCOUNTER — Ambulatory Visit
Admission: RE | Admit: 2020-09-16 | Discharge: 2020-09-16 | Disposition: A | Discharge status: Home / Self Care | Payer: Medicare Other | Source: Ambulatory Visit | Attending: Radiation Oncology | Admitting: Radiation Oncology

## 2020-09-16 DIAGNOSIS — Z510 Encounter for antineoplastic radiation therapy: Secondary | ICD-10-CM | POA: Diagnosis not present

## 2020-09-17 ENCOUNTER — Ambulatory Visit
Admission: RE | Admit: 2020-09-17 | Discharge: 2020-09-17 | Disposition: A | Discharge status: Home / Self Care | Payer: Medicare Other | Source: Ambulatory Visit | Attending: Radiation Oncology | Admitting: Radiation Oncology

## 2020-09-17 DIAGNOSIS — Z510 Encounter for antineoplastic radiation therapy: Secondary | ICD-10-CM | POA: Diagnosis not present

## 2020-09-18 ENCOUNTER — Ambulatory Visit
Admission: RE | Admit: 2020-09-18 | Discharge: 2020-09-18 | Disposition: A | Discharge status: Home / Self Care | Payer: Medicare Other | Source: Ambulatory Visit | Attending: Radiation Oncology | Admitting: Radiation Oncology

## 2020-09-18 DIAGNOSIS — Z510 Encounter for antineoplastic radiation therapy: Secondary | ICD-10-CM | POA: Diagnosis not present

## 2020-09-19 ENCOUNTER — Ambulatory Visit
Admission: RE | Admit: 2020-09-19 | Discharge: 2020-09-19 | Disposition: A | Discharge status: Home / Self Care | Payer: Medicare Other | Source: Ambulatory Visit | Attending: Radiation Oncology | Admitting: Radiation Oncology

## 2020-09-19 ENCOUNTER — Other Ambulatory Visit: Payer: Self-pay

## 2020-09-19 DIAGNOSIS — Z510 Encounter for antineoplastic radiation therapy: Secondary | ICD-10-CM | POA: Diagnosis not present

## 2020-09-22 ENCOUNTER — Telehealth: Payer: Self-pay | Admitting: Internal Medicine

## 2020-09-22 ENCOUNTER — Ambulatory Visit
Admission: RE | Admit: 2020-09-22 | Discharge: 2020-09-22 | Disposition: A | Discharge status: Home / Self Care | Payer: Medicare Other | Source: Ambulatory Visit | Attending: Radiation Oncology | Admitting: Radiation Oncology

## 2020-09-22 ENCOUNTER — Other Ambulatory Visit: Payer: Self-pay

## 2020-09-22 ENCOUNTER — Ambulatory Visit: Payer: Medicare Other

## 2020-09-22 DIAGNOSIS — H9190 Unspecified hearing loss, unspecified ear: Secondary | ICD-10-CM

## 2020-09-22 DIAGNOSIS — Z510 Encounter for antineoplastic radiation therapy: Secondary | ICD-10-CM | POA: Diagnosis not present

## 2020-09-22 NOTE — Telephone Encounter (Signed)
Spouse calling to request new referral to [facility] Patient has appointment 2/7 New hearing evaluation

## 2020-09-23 ENCOUNTER — Ambulatory Visit: Payer: Medicare Other

## 2020-09-24 ENCOUNTER — Ambulatory Visit
Admission: RE | Admit: 2020-09-24 | Discharge: 2020-09-24 | Disposition: A | Discharge status: Home / Self Care | Payer: Medicare Other | Source: Ambulatory Visit | Attending: Radiation Oncology | Admitting: Radiation Oncology

## 2020-09-24 ENCOUNTER — Encounter: Payer: Self-pay | Admitting: Radiation Oncology

## 2020-09-24 ENCOUNTER — Other Ambulatory Visit: Payer: Self-pay

## 2020-09-24 DIAGNOSIS — Z510 Encounter for antineoplastic radiation therapy: Secondary | ICD-10-CM | POA: Insufficient documentation

## 2020-09-24 DIAGNOSIS — C61 Malignant neoplasm of prostate: Secondary | ICD-10-CM | POA: Diagnosis not present

## 2020-09-29 DIAGNOSIS — H903 Sensorineural hearing loss, bilateral: Secondary | ICD-10-CM | POA: Diagnosis not present

## 2020-09-30 DIAGNOSIS — L111 Transient acantholytic dermatosis [Grover]: Secondary | ICD-10-CM | POA: Diagnosis not present

## 2020-09-30 DIAGNOSIS — M7130 Other bursal cyst, unspecified site: Secondary | ICD-10-CM | POA: Diagnosis not present

## 2020-09-30 DIAGNOSIS — Z85828 Personal history of other malignant neoplasm of skin: Secondary | ICD-10-CM | POA: Diagnosis not present

## 2020-09-30 DIAGNOSIS — L570 Actinic keratosis: Secondary | ICD-10-CM | POA: Diagnosis not present

## 2020-10-06 NOTE — Progress Notes (Signed)
  Radiation Oncology         [phone]) [phone] ________________________________  Name: Jason Compton MRN: [id number]  Date: 09/24/2020  DOB: [birth year]  End of Treatment Note  Diagnosis:   85 y.o. gentleman with Stage T1c adenocarcinoma of the prostate with Gleason score of 4+3, and PSA of 10.0.     Indication for treatment:  Curative, Definitive Radiotherapy       Radiation treatment dates:   08/12/20-09/24/20  Site/dose:   The prostate was treated to 70 Gy in 28 fractions of 2.5 Gy  Beams/energy:   The patient was treated with IMRT using volumetric arc therapy delivering 6 MV X-rays to clockwise and counterclockwise circumferential arcs with a 90 degree collimator offset to avoid dose scalloping.  Image guidance was performed with daily cone beam CT prior to each fraction to align to gold markers in the prostate and assure proper bladder and rectal fill volumes.  Immobilization was achieved with BodyFix custom mold.  Narrative: The patient tolerated radiation treatment relatively well.   The patient experienced some minor urinary irritation and modest fatigue.  Patient stated nocturia 3 times per night. Patient stated having stinging during urination.  Patient stated taking AZO to help with stinging. Patient stated not emptying his bladder completely and returning to the bathroom within 30 minutes. Patient stated urgency and is able to hold urine.   Plan: The patient has completed radiation treatment. He will return to radiation oncology clinic for routine followup in one month. I advised him to call or return sooner if he has any questions or concerns related to his recovery or treatment.  Patient scheduled for lab work on 5/3 then to follow up with Dr. Diona Fanti on 5/10. ________________________________  Sheral Apley. Tammi Klippel, M.D.

## 2020-10-08 ENCOUNTER — Telehealth: Payer: Self-pay | Admitting: Internal Medicine

## 2020-10-08 NOTE — Telephone Encounter (Signed)
Spoke with patient today. 

## 2020-10-08 NOTE — Telephone Encounter (Signed)
Patients wife calling, they were around a friend yesterday that tested positive today and her husband tested positive today from a at home test. They are wondering what they need to do from here. He is having no symptoms at this time.

## 2020-11-05 ENCOUNTER — Encounter: Payer: Self-pay | Admitting: Cardiovascular Disease

## 2020-11-05 ENCOUNTER — Ambulatory Visit: Payer: Medicare Other | Admitting: Cardiovascular Disease

## 2020-11-05 ENCOUNTER — Other Ambulatory Visit: Payer: Self-pay

## 2020-11-05 VITALS — BP 122/78 | HR 71 | Ht 68.0 in | Wt 208.0 lb

## 2020-11-05 DIAGNOSIS — Z9989 Dependence on other enabling machines and devices: Secondary | ICD-10-CM

## 2020-11-05 DIAGNOSIS — I700 Atherosclerosis of aorta: Secondary | ICD-10-CM

## 2020-11-05 DIAGNOSIS — I2510 Atherosclerotic heart disease of native coronary artery without angina pectoris: Secondary | ICD-10-CM | POA: Diagnosis not present

## 2020-11-05 DIAGNOSIS — I10 Essential (primary) hypertension: Secondary | ICD-10-CM | POA: Diagnosis not present

## 2020-11-05 DIAGNOSIS — G4733 Obstructive sleep apnea (adult) (pediatric): Secondary | ICD-10-CM

## 2020-11-05 DIAGNOSIS — E7800 Pure hypercholesterolemia, unspecified: Secondary | ICD-10-CM | POA: Diagnosis not present

## 2020-11-05 NOTE — Patient Instructions (Signed)
Medication Instructions:  ?Your physician recommends that you continue on your current medications as directed. Please refer to the Current Medication list given to you today.  ? ?*If you need a refill on your cardiac medications before your next appointment, please call your pharmacy* ? ?Lab Work: ?NONE ? ?Testing/Procedures: ?NONE ? ?Follow-Up: ?At [facility], you and your health needs are our priority.  As part of our continuing mission to provide you with exceptional heart care, we have created designated Provider Care Teams.  These Care Teams include your primary Cardiologist (physician) and Advanced Practice Providers (APPs -  Physician Assistants and Nurse Practitioners) who all work together to provide you with the care you need, when you need it. ? ?We recommend signing up for the patient portal called "MyChart".  Sign up information is provided on this After Visit Summary.  MyChart is used to connect with patients for Virtual Visits (Telemedicine).  Patients are able to view lab/test results, encounter notes, upcoming appointments, etc.  Non-urgent messages can be sent to your provider as well.   ?To learn more about what you can do with MyChart, go to [email or web address].   ? ?Your next appointment:   ?12 month(s) ? ?The format for your next appointment:   ?In Person ? ?Provider:   ?Tiffany [place], MD  ? ? ? ? ? ? ?

## 2020-11-05 NOTE — Progress Notes (Signed)
Cardiology Office Note  Date:  11/05/2020   ID:  Jason Compton, DOB 06-07-1936, MRN [id number]  PCP:  Binnie Rail, MD  Cardiologist:   Skeet Latch, MD   No chief complaint on file.  History of Present Illness: Jason Compton is a 85 y.o. male with non-obstructive CAD, hypertension, hyperlipidemia, OSA on CPAP,and Celiac disease who presents for follow up. Jason Compton was previously a patient of [name]. He continues to use his CPAP regularly.  He notes that he sleeps more soundly and has less nocturia on the CPAP machine. Jason Compton increased atorvastatin to 80mg .  He developed aching in his hips and arms.  He followed up with Jason Compton and started on Zetia.  He slowly has been titrating atorvastatin. He is now taking it four times per week. However he still had myalgias so we will switch to rosuvastatin. Since that time he has been doing well.  He reported increasing exertional dyspnea.  He had a Lexiscan Myoview 05/2019 that was negative for ischemia.    Jason Compton was recently diagnosed with prostate cancer.  He had core biopsy of the prostate and 2:16 samples were abnormal.  He underwent radiation with Dr. Tammi Klippel. He tolerated it well and is feeling great.  He plans to start travelling again soon.  He plans to visit the [facility] this spring.  He has been feeling well.  He ahs no edema, orthopnea or PND.  He has no more myalgias.  He continues to use his CPAP regularly.  He has nocturia 1-3 times per night.  He hasn't been walking as much but hopes to start now that the weather is improving.     Past Medical History:  Diagnosis Date  . Exogenous obesity   . GERD (gastroesophageal reflux disease)   . History of hiatal hernia   . Hyperlipidemia   . Hypertension   . Prostate cancer ([place])   . PVC's (premature ventricular contractions)   . Skin cancer     Past Surgical History:  Procedure Laterality Date  . CARDIOVASCULAR STRESS TEST  04/18/2009   NORMAL   . CATARACT EXTRACTION       Current Outpatient Medications  Medication Sig Dispense Refill  . amLODipine (NORVASC) 5 MG tablet Take 1 tablet (5 mg total) by mouth daily. 90 tablet 3  . aspirin 81 MG tablet Take 81 mg by mouth daily.    . chlorpheniramine-HYDROcodone (TUSSIONEX PENNKINETIC ER) 10-8 MG/5ML SUER Take 5 mLs by mouth every 12 (twelve) hours as needed for cough. 100 mL 0  . cholecalciferol (VITAMIN D) 1000 UNITS tablet Take 1,000 Units by mouth daily.    . cycloSPORINE (RESTASIS) 0.05 % ophthalmic emulsion Place 1 drop into both eyes 2 (two) times daily.    Marland Kitchen ezetimibe (ZETIA) 10 MG tablet TAKE 1 TABLET BY MOUTH  DAILY 90 tablet 2  . hydrochlorothiazide (HYDRODIURIL) 25 MG tablet TAKE 1 TABLET BY MOUTH  DAILY 90 tablet 3  . Multiple Vitamin (MULTIVITAMIN) tablet Take 1 tablet by mouth daily.    Marland Kitchen omeprazole (PRILOSEC) 20 MG capsule Take 20 mg by mouth daily.     Marland Kitchen OVER THE COUNTER MEDICATION hydroEyes 2 tablets twice a day    . rosuvastatin (CRESTOR) 20 MG tablet TAKE 1 TABLET BY MOUTH 3  TIMES WEEKLY OR AS DIRECTED 39 tablet 6  . triamcinolone (KENALOG) 0.1 % APPLY EXTERNALLY TO THE AFFECTED AREA TWICE DAILY 30 g 0  . vitamin C (ASCORBIC ACID) 500  MG tablet Take 500 mg by mouth daily.     No current facility-administered medications for this visit.    Allergies:   Altace [ramipril]    Social History:  The patient  reports that he has never smoked. He has never used smokeless tobacco. He reports current alcohol use. He reports that he does not use drugs.   Family History:  The patient's family history includes Cancer in his father; Hypertension in his brother; Osteoporosis in his mother; Skin cancer in his brother and father.    ROS:  Please see the history of present illness.   Otherwise, review of systems are positive for none.   All other systems are reviewed and negative.    PHYSICAL EXAM: VS:  BP 122/78   Pulse 71   Ht 5\' 8"  (1.727 m)   Wt 208 lb (94.3 kg)    SpO2 98%   BMI 31.63 kg/m  , BMI Body mass index is 31.63 kg/m. GENERAL:  Well appearing HEENT: Pupils equal round and reactive, fundi not visualized, oral mucosa unremarkable NECK:  No jugular venous distention, waveform within normal limits, carotid upstroke brisk and symmetric, no bruits LUNGS:  Clear to auscultation bilaterally HEART:  RRR.  PMI not displaced or sustained,S1 and S2 within normal limits, no S3, no S4, no clicks, no rubs, no murmurs ABD:  Flat, positive bowel sounds normal in frequency in pitch, no bruits, no rebound, no guarding, no midline pulsatile mass, no hepatomegaly, no splenomegaly EXT:  2 plus pulses throughout, no edema, no cyanosis no clubbing SKIN:  No rashes no nodules NEURO:  Cranial nerves II through XII grossly intact, motor grossly intact throughout PSYCH:  Cognitively intact, oriented to person place and time  EKG:  EKG is ordered today. The ekg ordered 03/30/16 demonstrates sinus rhythm rate 71 bpm.  Cannot rule out prior inferior infarct.  Left axis deviation.  Unchanged from prior.   11/19/16: Sinus rhythm. Rate 72 bpm. Low voltage limb leads. Prior inferior infarct 05/08/2018: Sinus rhythm.  LAFB.  Cannot rule out prior inferior infarct.  Low voltage. 11/28/19: Sinus rhythm.  Rate 63 bmp.  LAD.  Low voltage.  Prior inferior infarct.   11/05/20: Sinus rhythm.  Rate 71 bpm.  LAFB.  Lexiscan Myoview 05/2019:  Nuclear stress EF: 62%.  The left ventricular ejection fraction is normal (55-65%).  There was no ST segment deviation noted during stress.  No T wave inversion was noted during stress.  Blood pressure demonstrated a hypertensive response to exercise.  The study is normal.  This is a low risk study.  Recent Labs: 05/06/2020: ALT 24 05/13/2020: BUN 11; Creatinine, Ser 0.86; Potassium 4.4; Sodium 133    Lipid Panel    Component Value Date/Time   CHOL 166 03/07/2020 1217   TRIG 47 03/07/2020 1217   HDL 72 03/07/2020 1217   CHOLHDL 2.3  03/07/2020 1217   CHOLHDL 2 10/31/2019 1028   VLDL 10.6 10/31/2019 1028   LDLCALC 84 03/07/2020 1217   LDLDIRECT 189.7 12/10/2011 0901      Wt Readings from Last 3 Encounters:  11/05/20 208 lb (94.3 kg)  06/27/20 210 lb (95.3 kg)  05/21/20 207 lb (93.9 kg)      ASSESSMENT AND PLAN:  # Shortness of breath: Resolved.  Lexiscan Myoview was within normal limits 05/2019.  # Hypertension:   # Hyponatremia:  Blood pressure well-controlled on amlodipine and hydrochlorothiazide.  Discussed increasing his exercise to at least 150 minutes weekly.  # Hyperlipidemia.  LDL is above his goal of 70.  He is going to work on increasing his exercise.  Continue rosuvastatin and Zetia for now.  He had myalgias with higher doses of rosuvastatin.  Therefore, we will not increase the dose at this time.  # Coronary calcification:  Continue aspirin Zetia, rosuvastatin.  Limit fried foods, fatty foods, red meat, and cheese.  # OSA: Continue CPAP   Current medicines are reviewed at length with the patient today.  The patient does not have concerns regarding medicines.  The following changes have been made: None  Labs/ tests ordered today include:   No orders of the defined types were placed in this encounter.    Disposition:   FU with Deandria Klute C. Oval Linsey, MD, [facility] in 1 year     Signed, Quaron Delacruz C. Oval Linsey, MD, [facility]  11/05/2020 1:36 PM    [place]

## 2020-11-10 ENCOUNTER — Other Ambulatory Visit: Payer: Self-pay

## 2020-11-10 ENCOUNTER — Encounter: Payer: Self-pay | Admitting: Cardiovascular Disease

## 2020-11-10 ENCOUNTER — Ambulatory Visit: Payer: Medicare Other | Admitting: Cardiovascular Disease

## 2020-11-10 VITALS — BP 132/74 | HR 65 | Ht 68.0 in | Wt 209.8 lb

## 2020-11-10 DIAGNOSIS — I2510 Atherosclerotic heart disease of native coronary artery without angina pectoris: Secondary | ICD-10-CM | POA: Diagnosis not present

## 2020-11-10 DIAGNOSIS — E669 Obesity, unspecified: Secondary | ICD-10-CM

## 2020-11-10 DIAGNOSIS — I2584 Coronary atherosclerosis due to calcified coronary lesion: Secondary | ICD-10-CM

## 2020-11-10 DIAGNOSIS — C61 Malignant neoplasm of prostate: Secondary | ICD-10-CM

## 2020-11-10 DIAGNOSIS — G4733 Obstructive sleep apnea (adult) (pediatric): Secondary | ICD-10-CM | POA: Diagnosis not present

## 2020-11-10 DIAGNOSIS — Z9989 Dependence on other enabling machines and devices: Secondary | ICD-10-CM | POA: Diagnosis not present

## 2020-11-10 DIAGNOSIS — I10 Essential (primary) hypertension: Secondary | ICD-10-CM

## 2020-11-10 DIAGNOSIS — E785 Hyperlipidemia, unspecified: Secondary | ICD-10-CM | POA: Diagnosis not present
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
## 2020-11-10 NOTE — Patient Instructions (Signed)
Medication Instructions:  Your Physician recommend you continue on your current medication as directed.    *If you need a refill on your cardiac medications before your next appointment, please call your pharmacy*   Lab Work: None   Testing/Procedures: None   Follow-Up: At [facility], you and your health needs are our priority.  As part of our continuing mission to provide you with exceptional heart care, we have created designated Provider Care Teams.  These Care Teams include your primary Cardiologist (physician) and Advanced Practice Providers (APPs -  Physician Assistants and Nurse Practitioners) who all work together to provide you with the care you need, when you need it.  We recommend signing up for the patient portal called "MyChart".  Sign up information is provided on this After Visit Summary.  MyChart is used to connect with patients for Virtual Visits (Telemedicine).  Patients are able to view lab/test results, encounter notes, upcoming appointments, etc.  Non-urgent messages can be sent to your provider as well.   To learn more about what you can do with MyChart, go to [email or web address].    Your next appointment:   1 year(s) (Sleep Clinic)  The format for your next appointment:   In Person  Provider:   Shelva Majestic, MD

## 2020-11-17 NOTE — Progress Notes (Signed)
Subjective:    Patient ID: Jason Compton, male    DOB: 09/25/1935, 85 y.o.   MRN: [id number]  HPI The patient is here for follow up of their chronic medical problems, including CAD, htn, hyperlipidemia, prediabetes, OSA, history of prostate cancer  He had the prostate bx - confirmed prostate ca.  He has completed radiation.   Lump in left chest - soft, non-tender.  Has been there for a while.  Not really bigger.  He does not exercise.  He naps 30 min max.   Medications and allergies reviewed with patient and updated if appropriate.  Patient Active Problem List   Diagnosis Date Noted  . Malignant neoplasm of prostate ([place]) 06/29/2020  . Inguinal hernia 05/06/2020  . Cough 10/10/2018  . Herpes simplex 07/25/2018  . Allergic rhinitis 07/25/2018  . OSA on CPAP 05/08/2018  . History of adenomatous polyp of colon 02/15/2018  . Internal hemorrhoids 02/15/2018  . Cervical spondylosis without myelopathy 10/13/2017  . Aortic atherosclerosis ([place]) 04/28/2017  . Coronary artery disease 04/28/2017  . Prediabetes 11/02/2016  . Diverticulosis 07/30/2013  . Hard of hearing 07/30/2013  . Hypertension 03/08/2011  . Hypercholesterolemia 03/08/2011    Current Outpatient Medications on File Prior to Visit  Medication Sig Dispense Refill  . amLODipine (NORVASC) 5 MG tablet Take 1 tablet (5 mg total) by mouth daily. 90 tablet 3  . cholecalciferol (VITAMIN D) 1000 UNITS tablet Take 1,000 Units by mouth daily.    . cycloSPORINE (RESTASIS) 0.05 % ophthalmic emulsion Place 1 drop into both eyes 2 (two) times daily.    Marland Kitchen ezetimibe (ZETIA) 10 MG tablet TAKE 1 TABLET BY MOUTH  DAILY 90 tablet 2  . fluorouracil (EFUDEX) 5 % cream Apply topically at bedtime.    . hydrochlorothiazide (HYDRODIURIL) 25 MG tablet TAKE 1 TABLET BY MOUTH  DAILY 90 tablet 3  . Multiple Vitamin (MULTIVITAMIN) tablet Take 1 tablet by mouth daily.    Marland Kitchen omeprazole (PRILOSEC) 20 MG capsule Take 20 mg by mouth daily.     Marland Kitchen  OVER THE COUNTER MEDICATION hydroEyes 2 tablets twice a day    . rosuvastatin (CRESTOR) 20 MG tablet TAKE 1 TABLET BY MOUTH 3  TIMES WEEKLY OR AS DIRECTED 39 tablet 6  . triamcinolone (KENALOG) 0.1 % APPLY EXTERNALLY TO THE AFFECTED AREA TWICE DAILY 30 g 0  . vitamin C (ASCORBIC ACID) 500 MG tablet Take 500 mg by mouth daily.     No current facility-administered medications on file prior to visit.    Past Medical History:  Diagnosis Date  . Exogenous obesity   . GERD (gastroesophageal reflux disease)   . History of hiatal hernia   . Hyperlipidemia   . Hypertension   . Prostate cancer ([place])   . PVC's (premature ventricular contractions)   . Skin cancer     Past Surgical History:  Procedure Laterality Date  . CARDIOVASCULAR STRESS TEST  04/18/2009   NORMAL  . CATARACT EXTRACTION      Social History   Socioeconomic History  . Marital status: Married    Spouse name: Not on file  . Number of children: Not on file  . Years of education: Not on file  . Highest education level: Not on file  Occupational History  . Occupation: Chief Financial Officer    Comment: retired  Tobacco Use  . Smoking status: Never Smoker  . Smokeless tobacco: Never Used  Vaping Use  . Vaping Use: Never used  Substance and Sexual Activity  .  Alcohol use: Yes    Alcohol/week: 0.0 standard drinks    Comment: wine  . Drug use: No  . Sexual activity: Yes  Other Topics Concern  . Not on file  Social History Narrative  . Not on file   Social Determinants of Health   Financial Resource Strain: Not on file  Food Insecurity: Not on file  Transportation Needs: Not on file  Physical Activity: Not on file  Stress: Not on file  Social Connections: Not on file    Family History  Problem Relation Age of Onset  . Osteoporosis Mother   . Cancer Father        skin  . Skin cancer Father   . Hypertension Brother   . Skin cancer Brother   . Breast cancer Neg Hx   . Colon cancer Neg Hx   . Pancreatic cancer Neg Hx    . Prostate cancer Neg Hx     Review of Systems  Constitutional: Negative for fever.  Respiratory: Negative for cough, shortness of breath and wheezing.   Cardiovascular: Negative for chest pain, palpitations and leg swelling.  Neurological: Negative for light-headedness and headaches.       Objective:   Vitals:   11/18/20 0937  BP: 130/74  Pulse: 69  Temp: 98.5 F (36.9 C)  SpO2: 95%   BP Readings from Last 3 Encounters:  11/18/20 130/74  11/10/20 132/74  11/05/20 122/78   Wt Readings from Last 3 Encounters:  11/18/20 205 lb (93 kg)  11/10/20 209 lb 12.8 oz (95.2 kg)  11/05/20 208 lb (94.3 kg)   Body mass index is 31.17 kg/m.   Physical Exam    Constitutional: Appears well-developed and well-nourished. No distress.  HENT:  Head: Normocephalic and atraumatic.  Neck: Neck supple. No tracheal deviation present. No thyromegaly present.  No cervical lymphadenopathy Cardiovascular: Normal rate, regular rhythm and normal heart sounds.   No murmur heard. No carotid bruit .  No edema Pulmonary/Chest: Effort normal and breath sounds normal. No respiratory distress. No has no wheezes. No rales.  Skin: Skin is warm and dry. Not diaphoretic.  Superficial, mobile, soft which she lump left upper chest just under the clavicle-nontender Psychiatric: Normal mood and affect. Behavior is normal.      Assessment & Plan:      See Problem List for Assessment and Plan of chronic medical problems.    This visit occurred during the SARS-CoV-2 public health emergency.  Safety protocols were in place, including screening questions prior to the visit, additional usage of staff PPE, and extensive cleaning of exam room while observing appropriate contact time as indicated for disinfecting solutions.

## 2020-11-17 NOTE — Patient Instructions (Addendum)
Blood work was ordered.     Medications changes include :   none    Please followup in 1 year for a physical     The lump in your upper chest is a lipoma.     Lipoma  A lipoma is a noncancerous (benign) tumor that is made up of fat cells. This is a very common type of soft-tissue growth. Lipomas are usually found under the skin (subcutaneous). They may occur in any tissue of the body that contains fat. Common areas for lipomas to appear include the back, arms, shoulders, buttocks, and thighs. Lipomas grow slowly, and they are usually painless. Most lipomas do not cause problems and do not require treatment. What are the causes? The cause of this condition is not known. What increases the risk? You are more likely to develop this condition if:  You are 57-86 years old.  You have a family history of lipomas. What are the signs or symptoms? A lipoma usually appears as a small, round bump under the skin. In most cases, the lump will:  Feel soft or rubbery.  Not cause pain or other symptoms. However, if a lipoma is located in an area where it pushes on nerves, it can become painful or cause other symptoms. How is this diagnosed? A lipoma can usually be diagnosed with a physical exam. You may also have tests to confirm the diagnosis and to rule out other conditions. Tests may include:  Imaging tests, such as a CT scan or an MRI.  Removal of a tissue sample to be looked at under a microscope (biopsy). How is this treated? Treatment for this condition depends on the size of the lipoma and whether it is causing any symptoms.  For small lipomas that are not causing problems, no treatment is needed.  If a lipoma is bigger or it causes problems, surgery may be done to remove the lipoma. Lipomas can also be removed to improve appearance. Most often, the procedure is done after applying a medicine that numbs the area (local anesthetic).  Liposuction may be done to reduce the size  of the lipoma before it is removed through surgery, or it may be done to remove the lipoma. Lipomas are removed with this method in order to limit incision size and scarring. A liposuction tube is inserted through a small incision into the lipoma, and the contents of the lipoma are removed through the tube with suction. Follow these instructions at home:  Watch your lipoma for any changes.  Keep all follow-up visits as told by your health care provider. This is important. Contact a health care provider if:  Your lipoma becomes larger or hard.  Your lipoma becomes painful, red, or increasingly swollen. These could be signs of infection or a more serious condition. Get help right away if:  You develop tingling or numbness in an area near the lipoma. This could indicate that the lipoma is causing nerve damage. Summary  A lipoma is a noncancerous tumor that is made up of fat cells.  Most lipomas do not cause problems and do not require treatment.  If a lipoma is bigger or it causes problems, surgery may be done to remove the lipoma.  Contact a health care provider if your lipoma becomes larger or hard, or if it becomes painful, red, or increasingly swollen. Pain, redness, and swelling could be signs of infection or a more serious condition. This information is not intended to replace advice given to you by your  health care provider. Make sure you discuss any questions you have with your health care provider. Document Revised: 03/26/2019 Document Reviewed: 03/26/2019 Elsevier Patient Education  [employer].

## 2020-11-18 ENCOUNTER — Ambulatory Visit (INDEPENDENT_AMBULATORY_CARE_PROVIDER_SITE_OTHER): Payer: Medicare Other | Admitting: Internal Medicine

## 2020-11-18 ENCOUNTER — Encounter: Payer: Self-pay | Admitting: Internal Medicine

## 2020-11-18 ENCOUNTER — Other Ambulatory Visit: Payer: Self-pay

## 2020-11-18 VITALS — BP 130/74 | HR 69 | Temp 98.5°F | Ht 68.0 in | Wt 205.0 lb

## 2020-11-18 DIAGNOSIS — I2510 Atherosclerotic heart disease of native coronary artery without angina pectoris: Secondary | ICD-10-CM | POA: Diagnosis not present

## 2020-11-18 DIAGNOSIS — G4733 Obstructive sleep apnea (adult) (pediatric): Secondary | ICD-10-CM

## 2020-11-18 DIAGNOSIS — D179 Benign lipomatous neoplasm, unspecified: Secondary | ICD-10-CM

## 2020-11-18 DIAGNOSIS — R7303 Prediabetes: Secondary | ICD-10-CM | POA: Diagnosis not present

## 2020-11-18 DIAGNOSIS — I700 Atherosclerosis of aorta: Secondary | ICD-10-CM

## 2020-11-18 DIAGNOSIS — E7800 Pure hypercholesterolemia, unspecified: Secondary | ICD-10-CM

## 2020-11-18 DIAGNOSIS — Z9989 Dependence on other enabling machines and devices: Secondary | ICD-10-CM

## 2020-11-18 DIAGNOSIS — I10 Essential (primary) hypertension: Secondary | ICD-10-CM

## 2020-11-18 LAB — COMPREHENSIVE METABOLIC PANEL
ALT: 22 U/L (ref 0–53)
AST: 24 U/L (ref 0–37)
Albumin: 4.2 g/dL (ref 3.5–5.2)
Alkaline Phosphatase: 52 U/L (ref 39–117)
BUN: 10 mg/dL (ref 6–23)
CO2: 30 mEq/L (ref 19–32)
Calcium: 9.4 mg/dL (ref 8.4–10.5)
Chloride: 95 mEq/L — ABNORMAL LOW (ref 96–112)
Creatinine, Ser: 0.79 mg/dL (ref 0.40–1.50)
GFR: 82.69 mL/min (ref >60.00)
Glucose, Bld: 113 mg/dL — ABNORMAL HIGH (ref 70–99)
Potassium: 4.1 mEq/L (ref 3.5–5.1)
Sodium: 133 mEq/L — ABNORMAL LOW (ref 135–145)
Total Bilirubin: 1 mg/dL (ref 0.2–1.2)
Total Protein: 7.1 g/dL (ref 6.0–8.3)

## 2020-11-18 LAB — HEMOGLOBIN A1C: Hgb A1c MFr Bld: 5.6 % (ref 4.6–6.5)

## 2020-11-18 LAB — LIPID PANEL
Cholesterol: 166 mg/dL (ref 0–200)
HDL: 74.2 mg/dL (ref >39.00)
LDL Cholesterol: 79 mg/dL (ref 0–99)
NonHDL: 91.4
Total CHOL/HDL Ratio: 2
Triglycerides: 64 mg/dL (ref 0.0–149.0)
VLDL: 12.8 mg/dL (ref 0.0–40.0)

## 2020-11-18 NOTE — Assessment & Plan Note (Signed)
Chronic Following with cardiology No concerning symptoms to suggest angina Continue current medications

## 2020-11-18 NOTE — Assessment & Plan Note (Signed)
Chronic Blood pressure well controlled Continue hydrochlorothiazide 25 mg daily, amlodipine 5 mg daily CMP

## 2020-11-18 NOTE — Assessment & Plan Note (Signed)
Chronic Continue Zetia 10 mg daily and Crestor 20 mg 3 times a week Check lipid panel, CMP Encouraged regular exercise

## 2020-11-18 NOTE — Assessment & Plan Note (Signed)
Chronic Uses nightly

## 2020-11-18 NOTE — Assessment & Plan Note (Signed)
Palpable lump in left upper chest chest below mid clavicle was consistent with a lipoma Reassured Discussed that he could have this removed if he wished

## 2020-11-18 NOTE — Assessment & Plan Note (Signed)
Chronic Continue Zetia 10 mg daily and Crestor 20 mg 3 times a week Eats reasonably healthy Not exercising-encourage more regular exercise Check lipid panel

## 2020-11-18 NOTE — Assessment & Plan Note (Signed)
Chronic Check a1c Low sugar / carb diet Stressed regular exercise  

## 2020-11-24 ENCOUNTER — Ambulatory Visit: Payer: Medicare Other

## 2020-11-24 DIAGNOSIS — G4733 Obstructive sleep apnea (adult) (pediatric): Secondary | ICD-10-CM | POA: Diagnosis not present

## 2020-12-03 ENCOUNTER — Ambulatory Visit: Payer: Medicare Other | Attending: Internal Medicine

## 2020-12-03 ENCOUNTER — Other Ambulatory Visit: Payer: Self-pay

## 2020-12-03 DIAGNOSIS — Z23 Encounter for immunization: Secondary | ICD-10-CM

## 2020-12-03 NOTE — Progress Notes (Signed)
   Covid-19 Vaccination Clinic  Name:  RUFFUS KAMAKA    MRN: [id number] DOB: 06-02-1936  12/03/2020  Mr. Broughton was observed post Covid-19 immunization for 15 minutes without incident. He was provided with Vaccine Information Sheet and instruction to access the V-Safe system.   Mr. Donahue was instructed to call 911 with any severe reactions post vaccine: Marland Kitchen Difficulty breathing  . Swelling of face and throat  . A fast heartbeat  . A bad rash all over body  . Dizziness and weakness   Immunizations Administered    Name Date Dose VIS Date Route   PFIZER Comrnaty(Gray TOP) Covid-19 Vaccine 12/03/2020 12:03 PM 0.3 mL 07/31/2020 Intramuscular   Manufacturer: [place]   Lot: [id number]   NDC: [phone]

## 2020-12-08 ENCOUNTER — Other Ambulatory Visit (HOSPITAL_BASED_OUTPATIENT_CLINIC_OR_DEPARTMENT_OTHER): Payer: Self-pay

## 2020-12-08 MED ORDER — COVID-19 MRNA VACCINE (PFIZER) 30 MCG/0.3ML IM SUSP
INTRAMUSCULAR | 0 refills | Status: DC
  Filled 2020-12-08: qty 0.3, 1d supply, fill #0

## 2020-12-22 ENCOUNTER — Other Ambulatory Visit: Payer: Self-pay

## 2020-12-22 ENCOUNTER — Telehealth (INDEPENDENT_AMBULATORY_CARE_PROVIDER_SITE_OTHER): Payer: Medicare Other | Admitting: Internal Medicine

## 2020-12-22 ENCOUNTER — Ambulatory Visit: Payer: Medicare Other | Admitting: Cardiovascular Disease

## 2020-12-22 ENCOUNTER — Encounter: Payer: Self-pay | Admitting: Internal Medicine

## 2020-12-22 ENCOUNTER — Telehealth: Payer: Self-pay | Admitting: Internal Medicine

## 2020-12-22 DIAGNOSIS — U071 COVID-19: Secondary | ICD-10-CM

## 2020-12-22 DIAGNOSIS — J069 Acute upper respiratory infection, unspecified: Secondary | ICD-10-CM | POA: Insufficient documentation

## 2020-12-22 MED ORDER — HYDROCOD POLST-CPM POLST ER 10-8 MG/5ML PO SUER: 5.0000 mL | Freq: Two times a day (BID) | ORAL | 0 refills | Status: DC | PRN

## 2020-12-22 NOTE — Telephone Encounter (Signed)
Patients wife called and said that he tested positive for Covid 19. She said that he is running a fever. She said that he took an at home covid test. She is requesting a call back at [phone]. Please advise

## 2020-12-22 NOTE — Telephone Encounter (Signed)
Put in referral for Covid infusion for patient to see if he qualifies for treatment.

## 2020-12-22 NOTE — Progress Notes (Signed)
Virtual Visit via Video Note  I connected with Jason Compton on 12/22/20 at 10:00 AM EDT by a video enabled telemedicine application and verified that I am speaking with the correct person using two identifiers.   I discussed the limitations of evaluation and management by telemedicine and the availability of in person appointments. The patient expressed understanding and agreed to proceed.  Present for the visit:  Myself, Dr Billey Gosling, Angelita Ingles and his wife Jason Compton.  The patient is currently at home and I am in the office.    No referring provider.    History of Present Illness: He is here for an acute visit for cold symptoms.  His symptoms started yesterday.  Saturday cleaned out porch at [place] that was full of pollen.  He did wear a mask during this time.  He is experiencing occasional, deep cough x 4 weeks.  He does have other allergy-like symptoms so could be related to allergies.  A while ago he was having a cough when he was not taking the omeprazole on a daily basis, but he has been taking it daily and he feels his GERD is controlled.  His cough increased two days ago.  He has some wheezing that his wife has heard.  There is no mucus production or shortness of breath.  He does have some mild nasal congestion and had a low-grade fever last night.  He did have some loose stools starting last Thursday, 4 days ago.  He has tried taking Tussinex, Advil  COVID home test negative.      BP this morning 133/72, pulse 72.  Temp last night 99.9, today 98.9   Review of Systems  Constitutional: Positive for fever (low grade).  HENT: Positive for congestion (mild). Negative for ear pain, sinus pain and sore throat.        No PND  Respiratory: Positive for cough (x 4 weeks, worse after exposure to pollen two days ago) and wheezing. Negative for sputum production and shortness of breath.   Gastrointestinal: Negative for abdominal pain and diarrhea (water stool x few  days).  Musculoskeletal:       Shoulder are discomfort x 1 day  Neurological: Negative for dizziness and headaches.      Social History   Socioeconomic History  . Marital status: Married    Spouse name: Not on file  . Number of children: Not on file  . Years of education: Not on file  . Highest education level: Not on file  Occupational History  . Occupation: Chief Financial Officer    Comment: retired  Tobacco Use  . Smoking status: Never Smoker  . Smokeless tobacco: Never Used  Vaping Use  . Vaping Use: Never used  Substance and Sexual Activity  . Alcohol use: Yes    Alcohol/week: 0.0 standard drinks    Comment: wine  . Drug use: No  . Sexual activity: Yes  Other Topics Concern  . Not on file  Social History Narrative  . Not on file   Social Determinants of Health   Financial Resource Strain: Not on file  Food Insecurity: Not on file  Transportation Needs: Not on file  Physical Activity: Not on file  Stress: Not on file  Social Connections: Not on file     Observations/Objective: Appears well in NAD Breathing normally.  No coughing or audible wheezing.  No respiratory distress Skin appears warm and dry  Assessment and Plan:  See Problem List for Assessment and Plan of chronic  medical problems.   Follow Up Instructions:    I discussed the assessment and treatment plan with the patient. The patient was provided an opportunity to ask questions and all were answered. The patient agreed with the plan and demonstrated an understanding of the instructions.   The patient was advised to call back or seek an in-person evaluation if the symptoms worsen or if the condition fails to improve as anticipated.    Binnie Rail, MD

## 2020-12-22 NOTE — Telephone Encounter (Signed)
Spoke with patient today and all questions answered.  

## 2020-12-22 NOTE — Assessment & Plan Note (Signed)
Acute Symptoms consistent with viral URI Has low-grade fever, dry cough, some wheeze and mild nasal congestion.  Has had some loose stool for the past 4 days before his other symptoms started-?  Related or not Home COVID test negative Overall symptoms mild Continue symptomatic treatment Will renew Tussionex cough syrup Discussed inhaler versus few days of steroids for reactive airway disease/flare of allergic rhinitis/URI, but he deferred and would rather hold off No antibiotic needed at this time He will update me on his symptoms of the next couple of days since his symptoms have just started and it is difficult to say in what direction they will be going

## 2020-12-23 ENCOUNTER — Telehealth: Payer: Self-pay | Admitting: Unknown Physician Specialty

## 2020-12-23 ENCOUNTER — Telehealth: Payer: Self-pay

## 2020-12-23 NOTE — Telephone Encounter (Signed)
Outpatient Oral COVID Treatment Note  I connected with Jason Compton on 12/23/2020/9:55 AM by telephone and verified that I am speaking with the correct person using two identifiers.  I discussed the limitations, risks, security, and privacy concerns of performing an evaluation and management service by telephone and the availability of in person appointments. I also discussed with the patient that there may be a patient responsible charge related to this service. The patient expressed understanding and agreed to proceed.  Patient location: home Provider location: home  Diagnosis: COVID-19 infection  Purpose of visit: Discussion of potential use of Molnupiravir or Paxlovid, a new treatment for mild to moderate COVID-19 viral infection in non-hospitalized patients.   Subjective: Patient is a 85 y.o. male who has been diagnosed with COVID 19 viral infection.  Their symptoms began on 4/30 with cough.    Past Medical History:  Diagnosis Date  . Exogenous obesity   . GERD (gastroesophageal reflux disease)   . History of hiatal hernia   . Hyperlipidemia   . Hypertension   . Prostate cancer ([place])   . PVC's (premature ventricular contractions)   . Skin cancer     Allergies  Allergen Reactions  . Altace [Ramipril]     Doesn't recall reaction     Current Outpatient Medications:  .  amLODipine (NORVASC) 5 MG tablet, Take 1 tablet (5 mg total) by mouth daily., Disp: 90 tablet, Rfl: 3 .  chlorpheniramine-HYDROcodone (TUSSIONEX PENNKINETIC ER) 10-8 MG/5ML SUER, Take 5 mLs by mouth every 12 (twelve) hours as needed for cough., Disp: 100 mL, Rfl: 0 .  cholecalciferol (VITAMIN D) 1000 UNITS tablet, Take 1,000 Units by mouth daily., Disp: , Rfl:  .  COVID-19 mRNA vaccine, Pfizer, 30 MCG/0.3ML injection, Inject into the muscle., Disp: 0.3 mL, Rfl: 0 .  cycloSPORINE (RESTASIS) 0.05 % ophthalmic emulsion, Place 1 drop into both eyes 2 (two) times daily., Disp: , Rfl:  .  ezetimibe (ZETIA) 10 MG  tablet, TAKE 1 TABLET BY MOUTH  DAILY, Disp: 90 tablet, Rfl: 2 .  fluorouracil (EFUDEX) 5 % cream, Apply topically at bedtime., Disp: , Rfl:  .  hydrochlorothiazide (HYDRODIURIL) 25 MG tablet, TAKE 1 TABLET BY MOUTH  DAILY, Disp: 90 tablet, Rfl: 3 .  Multiple Vitamin (MULTIVITAMIN) tablet, Take 1 tablet by mouth daily., Disp: , Rfl:  .  omeprazole (PRILOSEC) 20 MG capsule, Take 20 mg by mouth daily. , Disp: , Rfl:  .  OVER THE COUNTER MEDICATION, hydroEyes 2 tablets twice a day, Disp: , Rfl:  .  rosuvastatin (CRESTOR) 20 MG tablet, TAKE 1 TABLET BY MOUTH 3  TIMES WEEKLY OR AS DIRECTED, Disp: 39 tablet, Rfl: 6 .  triamcinolone (KENALOG) 0.1 %, APPLY EXTERNALLY TO THE AFFECTED AREA TWICE DAILY, Disp: 30 g, Rfl: 0 .  vitamin C (ASCORBIC ACID) 500 MG tablet, Take 500 mg by mouth daily., Disp: , Rfl:   Objective: Patient appears/sounds well.  They are in no apparent distress.  Breathing is non labored.  Mood and behavior are normal.  Laboratory Data:  Recent Results (from the past 2160 hour(s))  Lipid panel     Status: None   Collection Time: 11/18/20 10:58 AM  Result Value Ref Range   Cholesterol 166 0 - 200 mg/dL    Comment: ATP III Classification       Desirable:  < 200 mg/dL               Borderline High:  200 - 239 mg/dL  High:  > = 240 mg/dL   Triglycerides 64.0 0.0 - 149.0 mg/dL    Comment: Normal:  <150 mg/dLBorderline High:  150 - 199 mg/dL   HDL 74.20 >39.00 mg/dL   VLDL 12.8 0.0 - 40.0 mg/dL   LDL Cholesterol 79 0 - 99 mg/dL   Total CHOL/HDL Ratio 2     Comment:                Men          Women1/2 Average Risk     3.4          3.3Average Risk          5.0          4.42X Average Risk          9.6          7.13X Average Risk          15.0          11.0                       NonHDL 91.40     Comment: NOTE:  Non-HDL goal should be 30 mg/dL higher than patient's LDL goal (i.e. LDL goal of < 70 mg/dL, would have non-HDL goal of < 100 mg/dL)  Hemoglobin A1c     Status: None    Collection Time: 11/18/20 10:58 AM  Result Value Ref Range   Hgb A1c MFr Bld 5.6 4.6 - 6.5 %    Comment: Glycemic Control Guidelines for People with Diabetes:Non Diabetic:  <6%Goal of Therapy: <7%Additional Action Suggested:  >8%   Comprehensive metabolic panel     Status: Abnormal   Collection Time: 11/18/20 10:58 AM  Result Value Ref Range   Sodium 133 (L) 135 - 145 mEq/L   Potassium 4.1 3.5 - 5.1 mEq/L   Chloride 95 (L) 96 - 112 mEq/L   CO2 30 19 - 32 mEq/L   Glucose, Bld 113 (H) 70 - 99 mg/dL   BUN 10 6 - 23 mg/dL   Creatinine, Ser 0.79 0.40 - 1.50 mg/dL   Total Bilirubin 1.0 0.2 - 1.2 mg/dL   Alkaline Phosphatase 52 39 - 117 U/L   AST 24 0 - 37 U/L   ALT 22 0 - 53 U/L   Total Protein 7.1 6.0 - 8.3 g/dL   Albumin 4.2 3.5 - 5.2 g/dL   GFR 82.69 >60.00 mL/min    Comment: Calculated using the CKD-EPI Creatinine Equation (2021)   Calcium 9.4 8.4 - 10.5 mg/dL     Assessment: 85 y.o. male with mild/moderate COVID 19 viral infection diagnosed on 4/29 at high risk for progression to severe COVID 19.  Plan:  This patient is a 85 y.o. male that meets the following criteria for Emergency Use Authorization of: Paxlovid 1. Age >12 yr AND > 40 kg 2. SARS-COV-2 positive test 3. Symptom onset < 5 days 4. Mild-to-moderate COVID disease with high risk for severe progression to hospitalization or death  I have spoken and communicated the following to the patient or parent/caregiver regarding: 1. Paxlovid is an unapproved drug that is authorized for use under an Emergency Use Authorization.  2. There are no adequate, approved, available products for the treatment of COVID-19 in adults who have mild-to-moderate COVID-19 and are at high risk for progressing to severe COVID-19, including hospitalization or death. 3. Other therapeutics are currently authorized. For additional information on all products authorized for treatment or  prevention of COVID-19, please see  [email or web address].  4. There are benefits and risks of taking this treatment as outlined in the "Fact Sheet for Patients and Caregivers."  5. "Fact Sheet for Patients and Caregivers" was reviewed with patient. A hard copy will be provided to patient from pharmacy prior to the patient receiving treatment. 6. Patients should continue to self-isolate and use infection control measures (e.g., wear mask, isolate, social distance, avoid sharing personal items, clean and disinfect "high touch" surfaces, and frequent handwashing) according to CDC guidelines.  7. The patient or parent/caregiver has the option to accept or refuse treatment. 8. Patient medication history was reviewed for potential drug interactions:Interaction with home meds: Crestor and Amlodipine Patient's GFR was calculated to be 86 After reviewing above information with the patient, the patient declines treatment.  Also declined Molnupiravir  Follow up instructions:    . Take prescription BID x 5 days as directed . Reach out to pharmacist for counseling on medication if desired . For concerns regarding further COVID symptoms please follow up with your PCP or urgent care . For urgent or life-threatening issues, seek care at your local emergency department  The patient was provided an opportunity to ask questions, and all were answered. The patient agreed with the plan and demonstrated an understanding of the instructions.    The patient was advised to call their PCP or seek an in-person evaluation if the symptoms worsen or if the condition fails to improve as anticipated.   I provided 15 minutes of non face-to-face telephone visit time during this encounter, and > 50% was spent counseling as documented under my assessment & plan.  Kathrine Haddock, NP 12/23/2020 /9:55 AM

## 2020-12-23 NOTE — Telephone Encounter (Signed)
Called to discuss with patient about COVID-19 symptoms and the use of one of the available treatments for those with mild to moderate Covid symptoms and at a high risk of hospitalization.  Pt appears to qualify for outpatient treatment due to co-morbid conditions and/or a member of an at-risk group in accordance with the FDA Emergency Use Authorization.    Symptom onset: 12/21/20 cough,fever Vaccinated: Yes Booster? Yes Immunocompromised?  Qualifiers: HTN,CAD NIH Criteria:   Pt. Would like to speak with APP.  Marcello Moores

## 2020-12-30 DIAGNOSIS — M7130 Other bursal cyst, unspecified site: Secondary | ICD-10-CM | POA: Diagnosis not present

## 2020-12-30 DIAGNOSIS — L570 Actinic keratosis: Secondary | ICD-10-CM | POA: Diagnosis not present

## 2020-12-30 DIAGNOSIS — Z85828 Personal history of other malignant neoplasm of skin: Secondary | ICD-10-CM | POA: Diagnosis not present

## 2021-01-20 DIAGNOSIS — G4733 Obstructive sleep apnea (adult) (pediatric): Secondary | ICD-10-CM | POA: Diagnosis not present

## 2021-01-25 NOTE — Progress Notes (Signed)
Subjective:    Patient ID: Jason Compton, male    DOB: Oct 11, 1935, 85 y.o.   MRN: [id number]  HPI The patient is here for follow up.  He had covid last month .     Cough - more severe last week.  Not sure if it was related to covid or not.  Deep cough.  He has had the cough since last January even probably before that.  At that time he was not on the omeprazole.  He restarted the omeprazole in January and it did improve his cough, but he still has it.  There does not seem to be any pattern to the cough.  His wife states it can happen in a day or at night.  It sounds very aggressive and deep.  Occur spontaneously.  He states he feels a tickle in the back of his throat and that causes him to cough.  He denies any postnasal drip.  He denies any GERD.  B/l arm rash -  Last two weeks - arms and hands. - no itch.  No obvious cause.  Denies sun exposure.  Denies new products.  Denies new medications.  Medications and allergies reviewed with patient and updated if appropriate.  Patient Active Problem List   Diagnosis Date Noted  . URI (upper respiratory infection) 12/22/2020  . Lipoma 11/18/2020  . Malignant neoplasm of prostate ([place]) 06/29/2020  . Inguinal hernia 05/06/2020  . Cough 10/10/2018  . Herpes simplex 07/25/2018  . Allergic rhinitis 07/25/2018  . OSA on CPAP 05/08/2018  . History of adenomatous polyp of colon 02/15/2018  . Internal hemorrhoids 02/15/2018  . Cervical spondylosis without myelopathy 10/13/2017  . Aortic atherosclerosis ([place]) 04/28/2017  . Coronary artery disease 04/28/2017  . Prediabetes 11/02/2016  . Diverticulosis 07/30/2013  . Hard of hearing 07/30/2013  . Hypertension 03/08/2011  . Hypercholesterolemia 03/08/2011    Current Outpatient Medications on File Prior to Visit  Medication Sig Dispense Refill  . amLODipine (NORVASC) 5 MG tablet Take 1 tablet (5 mg total) by mouth daily. 90 tablet 3  . chlorpheniramine-HYDROcodone (TUSSIONEX PENNKINETIC  ER) 10-8 MG/5ML SUER Take 5 mLs by mouth every 12 (twelve) hours as needed for cough. 100 mL 0  . cholecalciferol (VITAMIN D) 1000 UNITS tablet Take 1,000 Units by mouth daily.    Marland Kitchen COVID-19 mRNA vaccine, Pfizer, 30 MCG/0.3ML injection Inject into the muscle. 0.3 mL 0  . cycloSPORINE (RESTASIS) 0.05 % ophthalmic emulsion Place 1 drop into both eyes 2 (two) times daily.    Marland Kitchen ezetimibe (ZETIA) 10 MG tablet TAKE 1 TABLET BY MOUTH  DAILY 90 tablet 2  . fluorouracil (EFUDEX) 5 % cream Apply topically at bedtime.    . hydrochlorothiazide (HYDRODIURIL) 25 MG tablet TAKE 1 TABLET BY MOUTH  DAILY 90 tablet 3  . Multiple Vitamin (MULTIVITAMIN) tablet Take 1 tablet by mouth daily.    Marland Kitchen omeprazole (PRILOSEC) 20 MG capsule Take 20 mg by mouth daily.     Marland Kitchen OVER THE COUNTER MEDICATION hydroEyes 2 tablets twice a day    . rosuvastatin (CRESTOR) 20 MG tablet TAKE 1 TABLET BY MOUTH 3  TIMES WEEKLY OR AS DIRECTED 39 tablet 6  . triamcinolone (KENALOG) 0.1 % APPLY EXTERNALLY TO THE AFFECTED AREA TWICE DAILY 30 g 0  . vitamin C (ASCORBIC ACID) 500 MG tablet Take 500 mg by mouth daily.     No current facility-administered medications on file prior to visit.    Past Medical History:  Diagnosis  Date  . Exogenous obesity   . GERD (gastroesophageal reflux disease)   . History of hiatal hernia   . Hyperlipidemia   . Hypertension   . Prostate cancer ([place])   . PVC's (premature ventricular contractions)   . Skin cancer     Past Surgical History:  Procedure Laterality Date  . CARDIOVASCULAR STRESS TEST  04/18/2009   NORMAL  . CATARACT EXTRACTION      Social History   Socioeconomic History  . Marital status: Married    Spouse name: Not on file  . Number of children: Not on file  . Years of education: Not on file  . Highest education level: Not on file  Occupational History  . Occupation: Chief Financial Officer    Comment: retired  Tobacco Use  . Smoking status: Never Smoker  . Smokeless tobacco: Never Used   Vaping Use  . Vaping Use: Never used  Substance and Sexual Activity  . Alcohol use: Yes    Alcohol/week: 0.0 standard drinks    Comment: wine  . Drug use: No  . Sexual activity: Yes  Other Topics Concern  . Not on file  Social History Narrative  . Not on file   Social Determinants of Health   Financial Resource Strain: Not on file  Food Insecurity: Not on file  Transportation Needs: Not on file  Physical Activity: Not on file  Stress: Not on file  Social Connections: Not on file    Family History  Problem Relation Age of Onset  . Osteoporosis Mother   . Cancer Father        skin  . Skin cancer Father   . Hypertension Brother   . Skin cancer Brother   . Breast cancer Neg Hx   . Colon cancer Neg Hx   . Pancreatic cancer Neg Hx   . Prostate cancer Neg Hx     Review of Systems  Constitutional: Negative for fever.  HENT: Negative for congestion, postnasal drip, sinus pain, sneezing, sore throat and voice change.   Eyes: Positive for itching.  Respiratory: Positive for cough. Negative for wheezing.   Gastrointestinal: Negative for abdominal pain and nausea.       Denies gerd  Skin: Positive for rash.       Objective:   Vitals:   01/26/21 1506  BP: 118/72  Pulse: 65  Temp: 98.4 F (36.9 C)  SpO2: 97%   BP Readings from Last 3 Encounters:  01/26/21 118/72  11/18/20 130/74  11/10/20 132/74   Wt Readings from Last 3 Encounters:  01/26/21 208 lb (94.3 kg)  11/18/20 205 lb (93 kg)  11/10/20 209 lb 12.8 oz (95.2 kg)   Body mass index is 31.63 kg/m.   Physical Exam    Constitutional: Appears well-developed and well-nourished. No distress.   Skin: Skin is warm and dry. Not diaphoretic.  Ocular rash bilateral forearms and posterior hands.  Not much in the upper arms or anyplace else.      Assessment & Plan:    See Problem List for Assessment and Plan of chronic medical problems.    This visit occurred during the SARS-CoV-2 public health  emergency.  Safety protocols were in place, including screening questions prior to the visit, additional usage of staff PPE, and extensive cleaning of exam room while observing appropriate contact time as indicated for disinfecting solutions.

## 2021-01-26 ENCOUNTER — Encounter: Payer: Self-pay | Admitting: Internal Medicine

## 2021-01-26 ENCOUNTER — Ambulatory Visit (INDEPENDENT_AMBULATORY_CARE_PROVIDER_SITE_OTHER): Payer: Medicare Other | Admitting: Internal Medicine

## 2021-01-26 ENCOUNTER — Other Ambulatory Visit: Payer: Self-pay

## 2021-01-26 DIAGNOSIS — R059 Cough, unspecified: Secondary | ICD-10-CM | POA: Diagnosis not present

## 2021-01-26 DIAGNOSIS — R21 Rash and other nonspecific skin eruption: Secondary | ICD-10-CM | POA: Diagnosis not present

## 2021-01-26 MED ORDER — OMEPRAZOLE 40 MG PO CPDR: 40.0000 mg | DELAYED_RELEASE_CAPSULE | Freq: Every day | ORAL | 1 refills | Status: DC

## 2021-01-26 NOTE — Patient Instructions (Signed)
Increase omeprazole to 40 mg daily.       [employer]'s Current Therapy 2021 (pp. 213-216). [place], [place]: Elsevier.">  Gastroesophageal Reflux Disease, Adult Gastroesophageal reflux (GER) happens when acid from the stomach flows up into the tube that connects the mouth and the stomach (esophagus). Normally, food travels down the esophagus and stays in the stomach to be digested. However, when a person has GER, food and stomach acid sometimes move back up into the esophagus. If this becomes a more serious problem, the person may be diagnosed with a disease called gastroesophageal reflux disease (GERD). GERD occurs when the reflux:  Happens often.  Causes frequent or severe symptoms.  Causes problems such as damage to the esophagus. When stomach acid comes in contact with the esophagus, the acid may cause inflammation in the esophagus. Over time, GERD may create small holes (ulcers) in the lining of the esophagus. What are the causes? This condition is caused by a problem with the muscle between the esophagus and the stomach (lower esophageal sphincter, or LES). Normally, the LES muscle closes after food passes through the esophagus to the stomach. When the LES is weakened or abnormal, it does not close properly, and that allows food and stomach acid to go back up into the esophagus. The LES can be weakened by certain dietary substances, medicines, and medical conditions, including:  Tobacco use.  Pregnancy.  Having a hiatal hernia.  Alcohol use.  Certain foods and beverages, such as coffee, chocolate, onions, and peppermint. What increases the risk? You are more likely to develop this condition if you:  Have an increased body weight.  Have a connective tissue disorder.  Take NSAIDs, such as ibuprofen. What are the signs or symptoms? Symptoms of this condition include:  Heartburn.  Difficult or painful swallowing and the feeling of having a lump in the throat.  A bitter taste  in the mouth.  Bad breath and having a large amount of saliva.  Having an upset or bloated stomach and belching.  Chest pain. Different conditions can cause chest pain. Make sure you see your health care provider if you experience chest pain.  Shortness of breath or wheezing.  Ongoing (chronic) cough or a nighttime cough.  Wearing away of tooth enamel.  Weight loss. How is this diagnosed? This condition may be diagnosed based on a medical history and a physical exam. To determine if you have mild or severe GERD, your health care provider may also monitor how you respond to treatment. You may also have tests, including:  A test to examine your stomach and esophagus with a small camera (endoscopy).  A test that measures the acidity level in your esophagus.  A test that measures how much pressure is on your esophagus.  A barium swallow or modified barium swallow test to show the shape, size, and functioning of your esophagus. How is this treated? Treatment for this condition may vary depending on how severe your symptoms are. Your health care provider may recommend:  Changes to your diet.  Medicine.  Surgery. The goal of treatment is to help relieve your symptoms and to prevent complications. Follow these instructions at home: Eating and drinking  Follow a diet as recommended by your health care provider. This may involve avoiding foods and drinks such as: ? Coffee and tea, with or without caffeine. ? Drinks that contain alcohol. ? Energy drinks and sports drinks. ? Carbonated drinks or sodas. ? Chocolate and cocoa. ? Peppermint and mint flavorings. ? Garlic and  onions. ? Horseradish. ? Spicy and acidic foods, including peppers, chili powder, curry powder, vinegar, hot sauces, and barbecue sauce. ? Citrus fruit juices and citrus fruits, such as oranges, lemons, and limes. ? Tomato-based foods, such as red sauce, chili, salsa, and pizza with red sauce. ? Fried and fatty  foods, such as donuts, french fries, potato chips, and high-fat dressings. ? High-fat meats, such as hot dogs and fatty cuts of red and white meats, such as rib eye steak, sausage, ham, and bacon. ? High-fat dairy items, such as whole milk, butter, and cream cheese.  Eat small, frequent meals instead of large meals.  Avoid drinking large amounts of liquid with your meals.  Avoid eating meals during the 2-3 hours before bedtime.  Avoid lying down right after you eat.  Do not exercise right after you eat.   Lifestyle  Do not use any products that contain nicotine or tobacco. These products include cigarettes, chewing tobacco, and vaping devices, such as e-cigarettes. If you need help quitting, ask your health care provider.  Try to reduce your stress by using methods such as yoga or meditation. If you need help reducing stress, ask your health care provider.  If you are overweight, reduce your weight to an amount that is healthy for you. Ask your health care provider for guidance about a safe weight loss goal.   General instructions  Pay attention to any changes in your symptoms.  Take over-the-counter and prescription medicines only as told by your health care provider. Do not take aspirin, ibuprofen, or other NSAIDs unless your health care provider told you to take these medicines.  Wear loose-fitting clothing. Do not wear anything tight around your waist that causes pressure on your abdomen.  Raise (elevate) the head of your bed about 6 inches (15 cm). You can use a wedge to do this.  Avoid bending over if this makes your symptoms worse.  Keep all follow-up visits. This is important. Contact a health care provider if:  You have: ? New symptoms. ? Unexplained weight loss. ? Difficulty swallowing or it hurts to swallow. ? Wheezing or a persistent cough. ? A hoarse voice.  Your symptoms do not improve with treatment. Get help right away if:  You have sudden pain in your  arms, neck, jaw, teeth, or back.  You suddenly feel sweaty, dizzy, or light-headed.  You have chest pain or shortness of breath.  You vomit and the vomit is green, yellow, or black, or it looks like blood or coffee grounds.  You faint.  You have stool that is red, bloody, or black.  You cannot swallow, drink, or eat. These symptoms may represent a serious problem that is an emergency. Do not wait to see if the symptoms will go away. Get medical help right away. Call your local emergency services (911 in the [place] Do not drive yourself to the hospital. Summary  Gastroesophageal reflux happens when acid from the stomach flows up into the esophagus. GERD is a disease in which the reflux happens often, causes frequent or severe symptoms, or causes problems such as damage to the esophagus.  Treatment for this condition may vary depending on how severe your symptoms are. Your health care provider may recommend diet and lifestyle changes, medicine, or surgery.  Contact a health care provider if you have new or worsening symptoms.  Take over-the-counter and prescription medicines only as told by your health care provider. Do not take aspirin, ibuprofen, or other NSAIDs unless your  health care provider told you to do so.  Keep all follow-up visits as told by your health care provider. This is important. This information is not intended to replace advice given to you by your health care provider. Make sure you discuss any questions you have with your health care provider. Document Revised: 02/18/2020 Document Reviewed: 02/18/2020 Elsevier Patient Education  [place].

## 2021-01-26 NOTE — Assessment & Plan Note (Signed)
Acute Present for 2 weeks without improvement Not itchy No obvious cause Does not look like any concerning rash If no improvement would recommend seeing dermatology

## 2021-01-26 NOTE — Assessment & Plan Note (Signed)
Acute on chronic Likely GERD related There could also be other contributing factors Increase omeprazole to 40 mg daily and take this for at least 1 month to see if this helps Discussed revisions and diet Encouraged weight loss Ideally we would like to decrease the omeprazole back down to 20 mg at some point, but we will see how he does first with a higher dose for a good month If no improvement consider ENT referral-he has had this evaluated by ENT in the past for GI since we do think this is GERD related

## 2021-04-01 ENCOUNTER — Telehealth: Payer: Self-pay | Admitting: Internal Medicine

## 2021-04-01 NOTE — Telephone Encounter (Signed)
Left message for patient to call me back at [phone] to schedule Medicare Annual Wellness Visit.  No hx of AWV eligible as of 08/23/2009  Please schedule AWV-I at anytime with [facility] if patient calls the office back.   44 Minutes appointment   Any questions, please call me at [phone]

## 2021-04-20 DIAGNOSIS — G4733 Obstructive sleep apnea (adult) (pediatric): Secondary | ICD-10-CM | POA: Diagnosis not present

## 2021-04-28 DIAGNOSIS — Z961 Presence of intraocular lens: Secondary | ICD-10-CM | POA: Diagnosis not present

## 2021-04-28 DIAGNOSIS — H04123 Dry eye syndrome of bilateral lacrimal glands: Secondary | ICD-10-CM | POA: Diagnosis not present

## 2021-04-28 DIAGNOSIS — H0102A Squamous blepharitis right eye, upper and lower eyelids: Secondary | ICD-10-CM | POA: Diagnosis not present

## 2021-04-28 DIAGNOSIS — H0102B Squamous blepharitis left eye, upper and lower eyelids: Secondary | ICD-10-CM | POA: Diagnosis not present

## 2021-06-27 ENCOUNTER — Other Ambulatory Visit: Payer: Self-pay | Admitting: Cardiovascular Disease

## 2021-06-29 NOTE — Telephone Encounter (Signed)
Rx(s) sent to pharmacy electronically.  

## 2021-07-02 DIAGNOSIS — L570 Actinic keratosis: Secondary | ICD-10-CM | POA: Diagnosis not present

## 2021-07-02 DIAGNOSIS — L111 Transient acantholytic dermatosis [Grover]: Secondary | ICD-10-CM | POA: Diagnosis not present

## 2021-07-02 DIAGNOSIS — Z85828 Personal history of other malignant neoplasm of skin: Secondary | ICD-10-CM | POA: Diagnosis not present

## 2021-07-02 DIAGNOSIS — L821 Other seborrheic keratosis: Secondary | ICD-10-CM | POA: Diagnosis not present

## 2021-07-03 ENCOUNTER — Other Ambulatory Visit: Payer: Self-pay

## 2021-07-03 ENCOUNTER — Other Ambulatory Visit (HOSPITAL_BASED_OUTPATIENT_CLINIC_OR_DEPARTMENT_OTHER): Payer: Self-pay

## 2021-07-03 ENCOUNTER — Ambulatory Visit: Payer: Medicare Other | Attending: Internal Medicine

## 2021-07-03 DIAGNOSIS — Z23 Encounter for immunization: Secondary | ICD-10-CM

## 2021-07-03 MED ORDER — PFIZER COVID-19 VAC BIVALENT 30 MCG/0.3ML IM SUSP
INTRAMUSCULAR | 0 refills | Status: DC
  Filled 2021-07-03: qty 0.3, 1d supply, fill #0

## 2021-07-03 NOTE — Progress Notes (Signed)
   Covid-19 Vaccination Clinic  Name:  Jason Compton    MRN: [id number] DOB: 1936/03/26  07/03/2021  [name] was observed post Covid-19 immunization for 15 minutes without incident. He was provided with Vaccine Information Sheet and instruction to access the V-Safe system.   [name] was instructed to call 911 with any severe reactions post vaccine: Difficulty breathing  Swelling of face and throat  A fast heartbeat  A bad rash all over body  Dizziness and weakness   Immunizations Administered     Name Date Dose VIS Date Route   Pfizer Covid-19 Vaccine Bivalent Booster 07/03/2021  2:54 PM 0.3 mL 04/22/2021 Intramuscular   Manufacturer: [place]   Lot: [id number]   [place]: [phone]

## 2021-07-20 ENCOUNTER — Encounter: Payer: Self-pay | Admitting: Internal Medicine

## 2021-07-20 ENCOUNTER — Other Ambulatory Visit: Payer: Self-pay | Admitting: Internal Medicine

## 2021-07-20 MED ORDER — HYDROCOD POLST-CPM POLST ER 10-8 MG/5ML PO SUER: 5.0000 mL | Freq: Two times a day (BID) | ORAL | 0 refills | Status: DC | PRN

## 2021-07-21 ENCOUNTER — Telehealth: Payer: Medicare Other | Admitting: Family Medicine

## 2021-08-11 ENCOUNTER — Other Ambulatory Visit: Payer: Self-pay | Admitting: Cardiovascular Disease

## 2021-08-31 ENCOUNTER — Telehealth: Payer: Self-pay | Admitting: Cardiovascular Disease

## 2021-08-31 ENCOUNTER — Other Ambulatory Visit (HOSPITAL_BASED_OUTPATIENT_CLINIC_OR_DEPARTMENT_OTHER): Payer: Self-pay

## 2021-08-31 ENCOUNTER — Other Ambulatory Visit: Payer: Self-pay | Admitting: Cardiovascular Disease

## 2021-08-31 ENCOUNTER — Other Ambulatory Visit (HOSPITAL_BASED_OUTPATIENT_CLINIC_OR_DEPARTMENT_OTHER): Payer: Self-pay | Admitting: Cardiovascular Disease

## 2021-08-31 MED ORDER — ROSUVASTATIN CALCIUM 20 MG PO TABS: ORAL_TABLET | ORAL | 0 refills | Status: DC

## 2021-08-31 MED ORDER — ROSUVASTATIN CALCIUM 20 MG PO TABS: ORAL_TABLET | ORAL | 3 refills | Status: DC

## 2021-08-31 NOTE — Telephone Encounter (Signed)
Patient's wife called stating that the pharmacy had requested a refill for rosuvastatin (CRESTOR) 20 MG tablet.  I see it was sent on 08/11/21, the pharmacy is stating they haven't received anything. She is wondering if a paper copy of the script can be picked up.

## 2021-08-31 NOTE — Telephone Encounter (Signed)
Patient called to problems with refills. Long term refills sent to [facility] with a bridge dose sent to local pharmacy. Reodered due to prescriptions printing and not being sent electronically! Patient informed

## 2021-09-19 ENCOUNTER — Other Ambulatory Visit: Payer: Self-pay | Admitting: Cardiovascular Disease

## 2021-09-19 DIAGNOSIS — I10 Essential (primary) hypertension: Secondary | ICD-10-CM

## 2021-11-19 ENCOUNTER — Encounter: Payer: Medicare Other | Admitting: Internal Medicine

## 2021-11-22 ENCOUNTER — Other Ambulatory Visit: Payer: Self-pay | Admitting: Cardiovascular Disease

## 2021-11-22 DIAGNOSIS — I10 Essential (primary) hypertension: Secondary | ICD-10-CM

## 2021-11-24 ENCOUNTER — Encounter: Payer: Medicare Other | Admitting: Internal Medicine

## 2021-11-24 DIAGNOSIS — H0102A Squamous blepharitis right eye, upper and lower eyelids: Secondary | ICD-10-CM | POA: Diagnosis not present

## 2021-11-24 DIAGNOSIS — H0102B Squamous blepharitis left eye, upper and lower eyelids: Secondary | ICD-10-CM | POA: Diagnosis not present

## 2021-11-24 DIAGNOSIS — H04123 Dry eye syndrome of bilateral lacrimal glands: Secondary | ICD-10-CM | POA: Diagnosis not present

## 2021-11-24 DIAGNOSIS — Z961 Presence of intraocular lens: Secondary | ICD-10-CM | POA: Diagnosis not present

## 2021-12-02 ENCOUNTER — Encounter: Payer: Self-pay | Admitting: Internal Medicine

## 2021-12-02 NOTE — Patient Instructions (Addendum)
?Pneumonia 23 given ? ? ?Blood work was ordered.   ? ? ?Medications changes include :   None ? ? ? ? ?Return in about 6 months (around 06/04/2022) for follow up. ? ? ?Health Maintenance, Male ?Adopting a healthy lifestyle and getting preventive care are important in promoting health and wellness. Ask your health care provider about: ?The right schedule for you to have regular tests and exams. ?Things you can do on your own to prevent diseases and keep yourself healthy. ?What should I know about diet, weight, and exercise? ?Eat a healthy diet ? ?Eat a diet that includes plenty of vegetables, fruits, low-fat dairy products, and lean protein. ?Do not eat a lot of foods that are high in solid fats, added sugars, or sodium. ?Maintain a healthy weight ?Body mass index (BMI) is a measurement that can be used to identify possible weight problems. It estimates body fat based on height and weight. Your health care provider can help determine your BMI and help you achieve or maintain a healthy weight. ?Get regular exercise ?Get regular exercise. This is one of the most important things you can do for your health. Most adults should: ?Exercise for at least 150 minutes each week. The exercise should increase your heart rate and make you sweat (moderate-intensity exercise). ?Do strengthening exercises at least twice a week. This is in addition to the moderate-intensity exercise. ?Spend less time sitting. Even light physical activity can be beneficial. ?Watch cholesterol and blood lipids ?Have your blood tested for lipids and cholesterol at 84 years of age, then have this test every 5 years. ?You may need to have your cholesterol levels checked more often if: ?Your lipid or cholesterol levels are high. ?You are older than 84 years of age. ?You are at high risk for heart disease. ?What should I know about cancer screening? ?Many types of cancers can be detected early and may often be prevented. Depending on your health history and  family history, you may need to have cancer screening at various ages. This may include screening for: ?Colorectal cancer. ?Prostate cancer. ?Skin cancer. ?Lung cancer. ?What should I know about heart disease, diabetes, and high blood pressure? ?Blood pressure and heart disease ?High blood pressure causes heart disease and increases the risk of stroke. This is more likely to develop in people who have high blood pressure readings or are overweight. ?Talk with your health care provider about your target blood pressure readings. ?Have your blood pressure checked: ?Every 3-5 years if you are 12-58 years of age. ?Every year if you are 64 years old or older. ?If you are between the ages of 51 and 53 and are a current or former smoker, ask your health care provider if you should have a one-time screening for abdominal aortic aneurysm (AAA). ?Diabetes ?Have regular diabetes screenings. This checks your fasting blood sugar level. Have the screening done: ?Once every three years after age 35 if you are at a normal weight and have a low risk for diabetes. ?More often and at a younger age if you are overweight or have a high risk for diabetes. ?What should I know about preventing infection? ?Hepatitis B ?If you have a higher risk for hepatitis B, you should be screened for this virus. Talk with your health care provider to find out if you are at risk for hepatitis B infection. ?Hepatitis C ?Blood testing is recommended for: ?Everyone born from [birth year] through 1963. ?Anyone with known risk factors for hepatitis C. ?Sexually transmitted infections (  STIs) ?You should be screened each year for STIs, including gonorrhea and chlamydia, if: ?You are sexually active and are younger than 84 years of age. ?You are older than 84 years of age and your health care provider tells you that you are at risk for this type of infection. ?Your sexual activity has changed since you were last screened, and you are at increased risk for chlamydia or  gonorrhea. Ask your health care provider if you are at risk. ?Ask your health care provider about whether you are at high risk for HIV. Your health care provider may recommend a prescription medicine to help prevent HIV infection. If you choose to take medicine to prevent HIV, you should first get tested for HIV. You should then be tested every 3 months for as long as you are taking the medicine. ?Follow these instructions at home: ?Alcohol use ?Do not drink alcohol if your health care provider tells you not to drink. ?If you drink alcohol: ?Limit how much you have to 0-2 drinks a day. ?Know how much alcohol is in your drink. In the [place] one drink equals one 12 oz bottle of beer (355 mL), one 5 oz glass of wine (148 mL), or one 1? oz glass of hard liquor (44 mL). ?Lifestyle ?Do not use any products that contain nicotine or tobacco. These products include cigarettes, chewing tobacco, and vaping devices, such as e-cigarettes. If you need help quitting, ask your health care provider. ?Do not use street drugs. ?Do not share needles. ?Ask your health care provider for help if you need support or information about quitting drugs. ?General instructions ?Schedule regular health, dental, and eye exams. ?Stay current with your vaccines. ?Tell your health care provider if: ?You often feel depressed. ?You have ever been abused or do not feel safe at home. ?Summary ?Adopting a healthy lifestyle and getting preventive care are important in promoting health and wellness. ?Follow your health care provider's instructions about healthy diet, exercising, and getting tested or screened for diseases. ?Follow your health care provider's instructions on monitoring your cholesterol and blood pressure. ?This information is not intended to replace advice given to you by your health care provider. Make sure you discuss any questions you have with your health care provider. ?Document Revised: 12/29/2020 Document Reviewed: 12/29/2020 ?Elsevier  Patient Education ? [place]. ? ?

## 2021-12-02 NOTE — Progress Notes (Signed)
? ? ?Subjective:  ? ? Patient ID: Jason Compton, male    DOB: 1935/10/11, 86 y.o.   MRN: [id number] ? ? ?This visit occurred during the SARS-CoV-2 public health emergency.  Safety protocols were in place, including screening questions prior to the visit, additional usage of staff PPE, and extensive cleaning of exam room while observing appropriate contact time as indicated for disinfecting solutions. ? ? ?HPI ?Jason Compton is here for  ?Chief Complaint  ?Patient presents with  ? Annual Exam  ? ? ?Overall he feels well-no concerns.  He has minimal cough and is unsure if it is related to allergies.  Recent travel.  His wife has an upper respiratory infection.  His cough is mild.  He is on low-dose doxycycline for his eyes. ? ? ? ?Medications and allergies reviewed with patient and updated if appropriate. ? ? ?Current Outpatient Medications on File Prior to Visit  ?Medication Sig Dispense Refill  ? amLODipine (NORVASC) 5 MG tablet TAKE 1 TABLET BY MOUTH  DAILY 90 tablet 3  ? cholecalciferol (VITAMIN D) 1000 UNITS tablet Take 1,000 Units by mouth daily.    ? cycloSPORINE (RESTASIS) 0.05 % ophthalmic emulsion Place 1 drop into both eyes 2 (two) times daily.    ? doxycycline (ADOXA) 50 MG tablet Take 50 mg by mouth 2 (two) times daily.    ? ezetimibe (ZETIA) 10 MG tablet TAKE 1 TABLET BY MOUTH  DAILY 90 tablet 1  ? fluorouracil (EFUDEX) 5 % cream Apply topically at bedtime.    ? hydrochlorothiazide (HYDRODIURIL) 25 MG tablet TAKE 1 TABLET BY MOUTH DAILY 90 tablet 0  ? Multiple Vitamin (MULTIVITAMIN) tablet Take 1 tablet by mouth daily.    ? omeprazole (PRILOSEC) 40 MG capsule Take 1 capsule (40 mg total) by mouth daily. 90 capsule 1  ? OVER THE COUNTER MEDICATION hydroEyes 2 tablets twice a day    ? rosuvastatin (CRESTOR) 20 MG tablet TAKE 1 TABLET BY MOUTH 3  TIMES WEEKLY OR AS DIRECTED 90 tablet 3  ? triamcinolone (KENALOG) 0.1 % APPLY EXTERNALLY TO THE AFFECTED AREA TWICE DAILY 30 g 0  ? vitamin C (ASCORBIC ACID) 500 MG  tablet Take 500 mg by mouth daily.    ? ?No current facility-administered medications on file prior to visit.  ? ? ?Review of Systems  ?Constitutional:  Negative for fever.  ?HENT:  Positive for postnasal drip. Negative for congestion and sore throat.   ?Eyes:  Positive for itching. Negative for visual disturbance.  ?Respiratory:  Positive for cough (mild, dry). Negative for shortness of breath and wheezing.   ?Cardiovascular:  Negative for chest pain, palpitations and leg swelling.  ?Gastrointestinal:  Negative for abdominal pain, constipation, diarrhea and nausea.  ?     GERD controlled  ?Genitourinary:  Negative for dysuria.  ?Musculoskeletal:  Positive for arthralgias.  ?Skin:  Negative for rash.  ?Neurological:  Negative for light-headedness and headaches.  ?Psychiatric/Behavioral:  Negative for dysphoric mood.   ? ?   ?Objective:  ? ?Vitals:  ? 12/03/21 0835  ?BP: 116/80  ?Pulse: 69  ?Temp: 98.3 ?F (36.8 ?C)  ?SpO2: 99%  ? ?Filed Weights  ? 12/03/21 0835  ?Weight: 205 lb (93 kg)  ? ?Body mass index is 31.17 kg/m?. ? ?BP Readings from Last 3 Encounters:  ?12/03/21 116/80  ?01/26/21 118/72  ?11/18/20 130/74  ? ? ?Wt Readings from Last 3 Encounters:  ?12/03/21 205 lb (93 kg)  ?01/26/21 208 lb (94.3 kg)  ?11/18/20 205 lb (  93 kg)  ? ? ?  ?Physical Exam ?Constitutional: He appears well-developed and well-nourished. No distress.  ?HENT:  ?Head: Normocephalic and atraumatic.  ?Right Ear: External ear normal.  ?Left Ear: External ear normal.  ?Mouth/Throat: Oropharynx is clear and moist.  ?Normal ear canals and TM b/l  ?Eyes: Conjunctivae and EOM are normal.  ?Neck: Neck supple. No tracheal deviation present. No thyromegaly present.  ?No carotid bruit  ?Cardiovascular: Normal rate, regular rhythm, normal heart sounds and intact distal pulses.   ?No murmur heard. ?Pulmonary/Chest: Effort normal and breath sounds normal. No respiratory distress. He has no wheezes. He has no rales.  ?Abdominal: Soft. He exhibits no  distension. There is no tenderness.  ?Genitourinary: deferred  ?Musculoskeletal: He exhibits no edema.  ?Lymphadenopathy:   He has no cervical adenopathy.  ?Skin: Skin is warm and dry. He is not diaphoretic.  ?Psychiatric: He has a normal mood and affect. His behavior is normal.  ? ? ? ? ?   ?Assessment & Plan:  ? ?Physical exam: ?Screening blood work  ordered ?Exercise   regular-walking ?Weight he has lost a little weight-working on weight loss ?Substance abuse   none ? ? ?Reviewed recommended immunizations.  Pneumovax 23 today ? ? ?Health Maintenance  ?Topic Date Due  ? Pneumonia Vaccine 79+ Years old (2 - PPSV23 if available, else PCV20) 11/10/2018  ? INFLUENZA VACCINE  03/23/2022  ? TETANUS/TDAP  11/10/2027  ? COVID-19 Vaccine  Completed  ? Zoster Vaccines- Shingrix  Completed  ? HPV VACCINES  Aged Out  ? ? ? ?See Problem List for Assessment and Plan of chronic medical problems. ? ? ?

## 2021-12-03 ENCOUNTER — Ambulatory Visit (INDEPENDENT_AMBULATORY_CARE_PROVIDER_SITE_OTHER): Payer: Medicare Other | Admitting: Internal Medicine

## 2021-12-03 VITALS — BP 116/80 | HR 69 | Temp 98.3°F | Ht 68.0 in | Wt 205.0 lb

## 2021-12-03 DIAGNOSIS — E7800 Pure hypercholesterolemia, unspecified: Secondary | ICD-10-CM

## 2021-12-03 DIAGNOSIS — I700 Atherosclerosis of aorta: Secondary | ICD-10-CM | POA: Diagnosis not present

## 2021-12-03 DIAGNOSIS — K219 Gastro-esophageal reflux disease without esophagitis: Secondary | ICD-10-CM | POA: Diagnosis not present

## 2021-12-03 DIAGNOSIS — I10 Essential (primary) hypertension: Secondary | ICD-10-CM | POA: Diagnosis not present

## 2021-12-03 DIAGNOSIS — Z0000 Encounter for general adult medical examination without abnormal findings: Secondary | ICD-10-CM

## 2021-12-03 DIAGNOSIS — I2510 Atherosclerotic heart disease of native coronary artery without angina pectoris: Secondary | ICD-10-CM

## 2021-12-03 DIAGNOSIS — Z23 Encounter for immunization: Secondary | ICD-10-CM

## 2021-12-03 DIAGNOSIS — R7303 Prediabetes: Secondary | ICD-10-CM | POA: Diagnosis not present

## 2021-12-03 DIAGNOSIS — E871 Hypo-osmolality and hyponatremia: Secondary | ICD-10-CM

## 2021-12-03 DIAGNOSIS — C61 Malignant neoplasm of prostate: Secondary | ICD-10-CM

## 2021-12-03 LAB — COMPREHENSIVE METABOLIC PANEL
ALT: 23 U/L (ref 0–53)
AST: 20 U/L (ref 0–37)
Albumin: 4.2 g/dL (ref 3.5–5.2)
Alkaline Phosphatase: 56 U/L (ref 39–117)
BUN: 12 mg/dL (ref 6–23)
CO2: 30 mEq/L (ref 19–32)
Calcium: 8.9 mg/dL (ref 8.4–10.5)
Chloride: 90 mEq/L — ABNORMAL LOW (ref 96–112)
Creatinine, Ser: 0.74 mg/dL (ref 0.40–1.50)
GFR: 83.73 mL/min (ref >60.00)
Glucose, Bld: 102 mg/dL — ABNORMAL HIGH (ref 70–99)
Potassium: 3.9 mEq/L (ref 3.5–5.1)
Sodium: 126 mEq/L — ABNORMAL LOW (ref 135–145)
Total Bilirubin: 0.9 mg/dL (ref 0.2–1.2)
Total Protein: 6.9 g/dL (ref 6.0–8.3)

## 2021-12-03 LAB — LIPID PANEL
Cholesterol: 144 mg/dL (ref 0–200)
HDL: 70.3 mg/dL (ref >39.00)
LDL Cholesterol: 63 mg/dL (ref 0–99)
NonHDL: 73.91
Total CHOL/HDL Ratio: 2
Triglycerides: 57 mg/dL (ref 0.0–149.0)
VLDL: 11.4 mg/dL (ref 0.0–40.0)

## 2021-12-03 LAB — HEMOGLOBIN A1C: Hgb A1c MFr Bld: 6 % (ref 4.6–6.5)

## 2021-12-03 LAB — CBC WITH DIFFERENTIAL/PLATELET
Basophils Absolute: 0 10*3/uL (ref 0.0–0.1)
Basophils Relative: 0.5 % (ref 0.0–3.0)
Eosinophils Absolute: 0.1 10*3/uL (ref 0.0–0.7)
Eosinophils Relative: 1.1 % (ref 0.0–5.0)
HCT: 46.1 % (ref 39.0–52.0)
Hemoglobin: 16.1 g/dL (ref 13.0–17.0)
Lymphocytes Relative: 19.6 % (ref 12.0–46.0)
Lymphs Abs: 1 10*3/uL (ref 0.7–4.0)
MCHC: 34.9 g/dL (ref 30.0–36.0)
MCV: 97.2 fl (ref 78.0–100.0)
Monocytes Absolute: 0.6 10*3/uL (ref 0.1–1.0)
Monocytes Relative: 12.4 % — ABNORMAL HIGH (ref 3.0–12.0)
Neutro Abs: 3.3 10*3/uL (ref 1.4–7.7)
Neutrophils Relative %: 66.4 % (ref 43.0–77.0)
Platelets: 212 10*3/uL (ref 150.0–400.0)
RBC: 4.74 Mil/uL (ref 4.22–5.81)
RDW: 12.9 % (ref 11.5–15.5)
WBC: 5 10*3/uL (ref 4.0–10.5)

## 2021-12-03 LAB — TSH: TSH: 1.28 u[IU]/mL (ref 0.35–5.50)

## 2021-12-03 NOTE — Assessment & Plan Note (Signed)
Chronic Check a1c Low sugar / carb diet Stressed regular exercise  

## 2021-12-03 NOTE — Addendum Note (Signed)
Addended by: Marcina Millard on: 12/03/2021 10:44 AM ? ? Modules accepted: Orders ? ?

## 2021-12-03 NOTE — Assessment & Plan Note (Addendum)
Chronic ?GERD controlled ?Continue omeprazole 40 mg daily ?Will try to decrease to otc omeprazole 20 mg daily ? ?

## 2021-12-03 NOTE — Addendum Note (Signed)
Addended by: Binnie Rail on: 12/03/2021 08:52 PM ? ? Modules accepted: Orders ? ?

## 2021-12-03 NOTE — Assessment & Plan Note (Addendum)
Chronic ?Regular exercise and healthy diet encouraged ?Check lipid panel  ?Continue Zetia 10 mg daily, Crestor 20 mg 3 times weekly ?

## 2021-12-03 NOTE — Assessment & Plan Note (Addendum)
S/p radiation treatment  ?

## 2021-12-03 NOTE — Assessment & Plan Note (Signed)
Chronic ?Follows with cardiology ?No symptoms of angina ?Continue Zetia 10 mg daily, Crestor 20 mg 3 times weekly ?Continue regular exercise, heart healthy diet ?

## 2021-12-03 NOTE — Assessment & Plan Note (Signed)
Chronic ?Blood pressure well controlled ?CMP ?Continue HCTZ 25 mg daily, amlodipine 5 mg daily ?

## 2021-12-03 NOTE — Assessment & Plan Note (Addendum)
Chronic ?Continue healthy diet, regular exercise ?Continue rosuvastatin 20 mg 3 times weekly, continue Zetia 10 mg daily ?Last LDL near goal-unable to take any more statin due to side effects ?Lab Results  ?Component Value Date  ? [place] 79 11/18/2020  ? ? ?

## 2021-12-23 ENCOUNTER — Ambulatory Visit: Payer: Medicare Other | Admitting: Cardiovascular Disease

## 2021-12-23 ENCOUNTER — Encounter: Payer: Self-pay | Admitting: Cardiovascular Disease

## 2021-12-23 VITALS — BP 128/70 | HR 70 | Ht 68.0 in | Wt 204.6 lb

## 2021-12-23 DIAGNOSIS — C61 Malignant neoplasm of prostate: Secondary | ICD-10-CM

## 2021-12-23 DIAGNOSIS — E669 Obesity, unspecified: Secondary | ICD-10-CM

## 2021-12-23 DIAGNOSIS — I2510 Atherosclerotic heart disease of native coronary artery without angina pectoris: Secondary | ICD-10-CM

## 2021-12-23 DIAGNOSIS — Z9989 Dependence on other enabling machines and devices: Secondary | ICD-10-CM | POA: Diagnosis not present

## 2021-12-23 DIAGNOSIS — G4733 Obstructive sleep apnea (adult) (pediatric): Secondary | ICD-10-CM | POA: Diagnosis not present

## 2021-12-23 DIAGNOSIS — E785 Hyperlipidemia, unspecified: Secondary | ICD-10-CM

## 2021-12-23 DIAGNOSIS — I10 Essential (primary) hypertension: Secondary | ICD-10-CM

## 2021-12-23 NOTE — Progress Notes (Signed)
? ?  ? ?Cardiology Office Note   ? ?Date:  12/28/2021  ? ?ID:  Jason Compton, DOB 1936/03/03, MRN [id number] ? ?PCP:  Binnie Rail, MD  ?Cardiologist:  Shelva Majestic, MD (sleep); Dr. Oval Linsey ? ?History of Present Illness:  ?Jason Compton is a 86 y.o. male who presents to the office today for 14 month follow-up sleep evaluation following.  ? ?Jason Compton is a former patient of [name], and sees Dr. Oval Linsey for cardiology care.  He has a history of hypertension, hyperlipidemia, and celiac disease.  When seen by Dr. Oval Linsey.  His wife noted that he had very loud snoring and also was aware of witnessed apnea.  He was referred for a sleep study which was done on 06/29/2017.  This revealed severe obstructive sleep apnea with an AHI of 60.2.  There was absence of REM sleep on the diagnostic portion of the study.  Was moderate oxygen desaturation to a nadir of 83%.  There was moderate snoring and moderate periodic limb movements of sleep.  He was started on CPAP therapy with a set up date of 08/25/2017 and has a ResMed AirSense 10 AutoSet which initially was set at a minimum pressure of 10 and maximum pressure of 20.  He has been using nasal pillow mask.  A download from January 28 through 10/18/2017 reveals excellent compliance with 93% of days.  Use.  There was only 2 days when he was out of town that he did not use his machine.  He was averaging 7 hours of sleep per night.  AHI was 2.7.  95th percentile pressure was 13.6 with a maximum of 14.8. ? ?I saw him for initial sleep evaluation in February 2019.  Since initiating CPAP, his nocturia has improved from 5 times per night to 0-1 time per night.  His sleep is more restorative.  He is dreaming.  He denies residual daytime sleepiness.  During that evaluation an Epworth Sleepiness Scale score was calculated since initiating CPAP and this endorsed at 8.  ? ? ?Epworth Sleepiness Scale: ?Situation   Chance of Dozing/Sleeping (0 = never , 1 = slight chance ,  2 = moderate chance , 3 = high chance )  ? sitting and reading 1  ? watching TV 2  ? sitting inactive in a public place 1  ? being a passenger in a motor vehicle for an hour or more 0  ? lying down in the afternoon 3  ? sitting and talking to someone 0  ? sitting quietly after lunch (no alcohol) 1  ? while stopped for a few minutes in traffic as the driver 0  ? Total Score  8  ? ?When seen at his initial sleep evaluation his compliance was excellent.  He noted marked improvement in sleep quality.  AHI was 2.7.  He was using a nasal mask. ? ?I saw him on October 24, 2018. He has continued to travel extensively.  He had purchased a portable ResMed air mini CPAP unit to take with his travel.  He uses the full air sense 10 AutoSet unit at home.  He admits to 100% compliance with use with combined treatment modalities.  A download for the month of December showed excellent compliance the days which he did not use his main treatment he was using his portable unit away from home.  AHI was excellent at 0.5.  He was set at a minimum pressure of 10 a maximum of 20 cm with 95th  percentile average pressure 12.9 and maximum average pressure 13.0. ? ?Prior to his office visit on October 24 2018 he had just returned from an extensive trip to [place] and [place] for 1 month and used his portable unit from mid January through mid February.  He is sleeping well.  He denies residual daytime sleepiness.  A repeat Epworth Sleepiness Scale score was calculated in the office  and this endorsed at 7. He was using CPAP with excellent compliance. His previous nocturia of 5 times a night had been dramatically reduced to 01 time per night with therapy. ? ?He underwent a nuclear stress test in October 2020 which was low risk. Over the past year he has felt well and he denies chest pain or shortness of breath. Due to the COVID-19 pandemic he obviously has not traveled. A new download was obtained in the office today from March 16 through  December 05, 2019 which showed 100% compliance with average usage around 8 hours per night. His ResMed air sense 10 AutoSet unit is set at a minimum pressure of 10 and maximum of 20. AHI is excellent at 0.7 with his 95% average pressure at 12.8 and maximum average pressure 13.7. He sleeps well. He denies residual daytime sleepiness. An Epworth Sleepiness Scale was recalculated in the office today and this endorsed at 7.  ? ?He has been diagnosed with prostate CA and underwent radiation therapy with Dr. Tammi Klippel. ? ?I last saw him on November 10, 2020.  From a sleep  perspective, he continued to use CPAP with excellent compliance.  He does not sleep without it.  A download was obtained from February 17 through November 07, 2020.  Average use is 7 hours and 30 minutes per night.  His pressure is set at a minimum pressure of 10 with maximum up to 20 cm of water.  AHI is excellent at 0.3 and his 95th percentile pressure is 12.8 with a maximum average pressure of 13.6 cm of water.  At times there is borderline leak.  He has been using a ResMed N 30 mask with the tubing coming to the mask.  I discussed with him potential opportunity to try the N 30i mask where the tubing originates from the crown of his head which will allow for easier movement turning from side to side without potential tubing getting in the way.  He continues to be on Lodine 5 mg and HCTZ 25 mg for hypertension and had recent ankle swelling which improved with dose reduction of amlodipine.  He is on Zetia and rosuvastatin for hyperlipidemia.  ? ?Since I last saw him, he has continued to do well.  He and his wife continue to travel.  He uses CPAP with 100% compliance and does not sleep without it.  When he travels he has a ResMed mini travel CPAP unit.  His most recent download continues to show excellent compliance in the days he did not use therapy was because he was traveling and had his many CPAP with him.  His pressure is set at a range of 10 to 20 cm and AHI  is excellent at 0.2.  His 95th percentile pressure is 12.6 with maximum average pressure 13.5.  He continues to see Billey Gosling, MD for primary care.  He continues to be on amlodipine 5 mg, HCTZ 25 mg for hypertension, Zetia 10 mg and rosuvastatin 20 mg for hyperlipidemia.  He is on omeprazole for GERD.  He presents for evaluation. ?  ?Past  Medical History:  ?Diagnosis Date  ? Exogenous obesity   ? GERD (gastroesophageal reflux disease)   ? History of hiatal hernia   ? Hyperlipidemia   ? Hypertension   ? Prostate cancer ([place])   ? PVC's (premature ventricular contractions)   ? Skin cancer   ? ? ?Past Surgical History:  ?Procedure Laterality Date  ? CARDIOVASCULAR STRESS TEST  04/18/2009  ? NORMAL  ? CATARACT EXTRACTION    ? ? ?Current Medications: ?Outpatient Medications Prior to Visit  ?Medication Sig Dispense Refill  ? amLODipine (NORVASC) 5 MG tablet TAKE 1 TABLET BY MOUTH  DAILY 90 tablet 3  ? cholecalciferol (VITAMIN D) 1000 UNITS tablet Take 1,000 Units by mouth daily.    ? cycloSPORINE (RESTASIS) 0.05 % ophthalmic emulsion Place 1 drop into both eyes 2 (two) times daily.    ? ezetimibe (ZETIA) 10 MG tablet TAKE 1 TABLET BY MOUTH  DAILY 90 tablet 1  ? famotidine (PEPCID) 10 MG tablet Take 10 mg by mouth daily.    ? fluorouracil (EFUDEX) 5 % cream Apply topically at bedtime.    ? hydrochlorothiazide (HYDRODIURIL) 25 MG tablet TAKE 1 TABLET BY MOUTH DAILY 90 tablet 0  ? omeprazole (PRILOSEC) 40 MG capsule Take 1 capsule (40 mg total) by mouth daily. (Patient taking differently: Take 20 mg by mouth daily.) 90 capsule 1  ? OVER THE COUNTER MEDICATION hydroEyes 2 tablets twice a day    ? rosuvastatin (CRESTOR) 20 MG tablet TAKE 1 TABLET BY MOUTH 3  TIMES WEEKLY OR AS DIRECTED 90 tablet 3  ? triamcinolone (KENALOG) 0.1 % APPLY EXTERNALLY TO THE AFFECTED AREA TWICE DAILY 30 g 0  ? vitamin C (ASCORBIC ACID) 500 MG tablet Take 500 mg by mouth daily.    ? Multiple Vitamin (MULTIVITAMIN) tablet Take 1 tablet by mouth  daily. (Patient not taking: Reported on 12/23/2021)    ? doxycycline (ADOXA) 50 MG tablet Take 50 mg by mouth 2 (two) times daily.    ? ?No facility-administered medications prior to visit.  ?  ? ?Allergies:

## 2021-12-23 NOTE — Patient Instructions (Signed)
Medication Instructions:  ? ?No changes ? ?*If you need a refill on your cardiac medications before your next appointment, please call your pharmacy* ? ? ?Lab Work: ? ?Not  needed ? ? ?Testing/Procedures: ? ?Not needed ? ?Follow-Up: ?At [facility], you and your health needs are our priority.  As part of our continuing mission to provide you with exceptional heart care, we have created designated Provider Care Teams.  These Care Teams include your primary Cardiologist (physician) and Advanced Practice Providers (APPs -  Physician Assistants and Nurse Practitioners) who all work together to provide you with the care you need, when you need it. ? ?  ? ?Your next appointment:   ?12 month(s) sleep clinic ? ?The format for your next appointment:   ?In Person ? ?Provider:   ?Dr Shelva Majestic ? ? ?Other Instructions  ?No  pressure changes were done to your C-PAP  ?

## 2021-12-28 ENCOUNTER — Encounter: Payer: Self-pay | Admitting: Cardiovascular Disease

## 2022-01-15 ENCOUNTER — Other Ambulatory Visit: Payer: Self-pay | Admitting: Cardiovascular Disease

## 2022-01-15 NOTE — Telephone Encounter (Signed)
Rx(s) sent to pharmacy electronically.  

## 2022-03-15 ENCOUNTER — Other Ambulatory Visit: Payer: Self-pay | Admitting: Cardiovascular Disease

## 2022-03-15 DIAGNOSIS — I10 Essential (primary) hypertension: Secondary | ICD-10-CM

## 2022-03-16 NOTE — Telephone Encounter (Signed)
Rx(s) sent to pharmacy electronically.  

## 2022-05-05 ENCOUNTER — Other Ambulatory Visit: Payer: Self-pay | Admitting: Cardiovascular Disease

## 2022-05-05 NOTE — Telephone Encounter (Signed)
Rx request sent to pharmacy.  

## 2022-05-05 NOTE — Telephone Encounter (Signed)
Please call pt to schedule overdue follow-up with Dr. Oval Linsey for refills. Last seen 10/2020. Thank you!

## 2022-05-13 DIAGNOSIS — Z961 Presence of intraocular lens: Secondary | ICD-10-CM | POA: Diagnosis not present

## 2022-05-13 DIAGNOSIS — H0102B Squamous blepharitis left eye, upper and lower eyelids: Secondary | ICD-10-CM | POA: Diagnosis not present

## 2022-05-13 DIAGNOSIS — H0102A Squamous blepharitis right eye, upper and lower eyelids: Secondary | ICD-10-CM | POA: Diagnosis not present

## 2022-05-13 DIAGNOSIS — H04123 Dry eye syndrome of bilateral lacrimal glands: Secondary | ICD-10-CM | POA: Diagnosis not present

## 2022-05-19 ENCOUNTER — Encounter (HOSPITAL_BASED_OUTPATIENT_CLINIC_OR_DEPARTMENT_OTHER): Payer: Self-pay | Admitting: Cardiovascular Disease

## 2022-05-28 ENCOUNTER — Ambulatory Visit (HOSPITAL_BASED_OUTPATIENT_CLINIC_OR_DEPARTMENT_OTHER): Payer: Medicare Other | Admitting: Nurse Practitioner

## 2022-05-28 ENCOUNTER — Encounter (HOSPITAL_BASED_OUTPATIENT_CLINIC_OR_DEPARTMENT_OTHER): Payer: Self-pay | Admitting: Nurse Practitioner

## 2022-05-28 VITALS — BP 122/88 | HR 71 | Ht 68.0 in | Wt 208.0 lb

## 2022-05-28 DIAGNOSIS — I700 Atherosclerosis of aorta: Secondary | ICD-10-CM

## 2022-05-28 DIAGNOSIS — G4733 Obstructive sleep apnea (adult) (pediatric): Secondary | ICD-10-CM | POA: Diagnosis not present

## 2022-05-28 DIAGNOSIS — I10 Essential (primary) hypertension: Secondary | ICD-10-CM | POA: Diagnosis not present

## 2022-05-28 DIAGNOSIS — R400 Somnolence: Secondary | ICD-10-CM

## 2022-05-28 DIAGNOSIS — I2510 Atherosclerotic heart disease of native coronary artery without angina pectoris: Secondary | ICD-10-CM

## 2022-05-28 DIAGNOSIS — E785 Hyperlipidemia, unspecified: Secondary | ICD-10-CM | POA: Diagnosis not present

## 2022-05-28 NOTE — Progress Notes (Signed)
Cardiology Office Note:    Date:  05/28/2022   ID:  Jason Compton, DOB 20-Apr-1936, MRN [id number]  PCP:  Binnie Rail, MD   [facility] Prov[facility] Cardiologist:  Skeet Latch, MD Sleep Medicine:  Shelva Majestic, MD     Referring MD: Binnie Rail, MD   CC: Here for follow-up  History of Present Illness:    Jason Compton is a 86 y.o. male with a hx of the following:   CAD Aortic atherosclerosis HTN OSA on CPAP GERD Prostate cancer Prediabetes  Previous cardiovascular history includes nonobstructive CAD.  Most recent nuclear medicine stress test performed due to increasing exertional dyspnea in 2020 was normal.  Last seen by Dr. Oval Linsey on November 05, 2020.  Was compliant with CPAP.  He had titrated Lipitor to 80 mg and developed myalgia and arms and hips, was started on Zetia and has been titrating Lipitor.  Only takes Lipitor 4 times per week.  Continue to have myalgias with this reduced dosing so he was switched to Crestor.  Prior to this visit with Dr. Oval Linsey, he was recently diagnosed with prostate cancer.  Underwent radiation with Dr. Tammi Klippel and tolerated well.  Overall was feeling well and doing good from a cardiac perspective.  Was planning on increasing activity.  Today he presents for overdue follow-up with his wife.  He says he is doing really well.  Would like to discuss options for cholesterol medication as he has stopped Crestor last Friday and myalgias have resolved. History of statin intolerance.  Denies any chest pain, shortness of breath, palpitations, syncope, presyncope, dizziness, orthopnea, PND, claudication, or bleeding.  BP well controlled at home.  Trying to get a routine for physical activity.  Enjoys traveling with his wife, recently got back from a tour of the [place].  Currently building one story house, so he stays busy traveling and making arrangements for that.  Also enjoys playing golf.  Wife who is present in the room is  asking about Raynaud's phenomenon, patient's brother has been diagnosed with this, says fingers are cool sometimes. Wife says patient does nap more than usual.  Would like to know more information regarding Raynaud's.  Discussed avoiding triggers including extreme temperatures and stress. He is on amlodipine, so this should help avoid vasoconstriction. Says he wears a CPAP every night, however he does not wear this when traveling, says it is too bulky to travel with.  Patient denies any other questions or concerns today.  Past Medical History:  Diagnosis Date   Exogenous obesity    GERD (gastroesophageal reflux disease)    History of hiatal hernia    Hyperlipidemia    Hypertension    Prostate cancer (HCC)    PVC's (premature ventricular contractions)    Skin cancer     Past Surgical History:  Procedure Laterality Date   CARDIOVASCULAR STRESS TEST  04/18/2009   NORMAL   CATARACT EXTRACTION      Current Medications: Current Meds  Medication Sig   amLODipine (NORVASC) 5 MG tablet Take 1 tablet (5 mg total) by mouth daily. Please keep your upcoming appointment for refills.   cholecalciferol (VITAMIN D) 1000 UNITS tablet Take 1,000 Units by mouth daily.   cycloSPORINE (RESTASIS) 0.05 % ophthalmic emulsion Place 1 drop into both eyes 2 (two) times daily.   ezetimibe (ZETIA) 10 MG tablet TAKE 1 TABLET BY MOUTH DAILY   famotidine (PEPCID) 10 MG tablet Take 10 mg by mouth daily.   hydrochlorothiazide (  HYDRODIURIL) 25 MG tablet TAKE 1 TABLET BY MOUTH DAILY   Multiple Vitamin (MULTIVITAMIN) tablet Take 1 tablet by mouth daily.   omeprazole (PRILOSEC) 40 MG capsule Take 1 capsule (40 mg total) by mouth daily. (Patient taking differently: Take 20 mg by mouth daily.)   OVER THE COUNTER MEDICATION hydroEyes 2 tablets twice a day   vitamin C (ASCORBIC ACID) 500 MG tablet Take 500 mg by mouth daily.     Allergies:   Altace [ramipril], Lipitor [atorvastatin], and Rosuvastatin   Social History    Socioeconomic History   Marital status: Married    Spouse name: Not on file   Number of children: Not on file   Years of education: Not on file   Highest education level: Not on file  Occupational History   Occupation: Chief Financial Officer    Comment: retired  Tobacco Use   Smoking status: Never   Smokeless tobacco: Never  Vaping Use   Vaping Use: Never used  Substance and Sexual Activity   Alcohol use: Yes    Alcohol/week: 0.0 standard drinks of alcohol    Comment: wine   Drug use: No   Sexual activity: Yes  Other Topics Concern   Not on file  Social History Narrative   Not on file   Social Determinants of Health   Financial Resource Strain: Not on file  Food Insecurity: Not on file  Transportation Needs: Not on file  Physical Activity: Not on file  Stress: Not on file  Social Connections: Not on file     Family History: The patient's family history includes Cancer in his father; Hypertension in his brother; Osteoporosis in his mother; Skin cancer in his brother and father. There is no history of Breast cancer, Colon cancer, Pancreatic cancer, or Prostate cancer.  ROS:   Review of Systems  Constitutional: Negative.   HENT: Negative.    Eyes: Negative.   Respiratory: Negative.    Cardiovascular: Negative.   Gastrointestinal: Negative.   Genitourinary: Negative.   Musculoskeletal: Negative.   Skin: Negative.   Neurological:        Cool fingers occasionally.  Wife is concerned about diagnosis of Raynaud's phenomenon.  See HPI.  Endo/Heme/Allergies: Negative.   Psychiatric/Behavioral: Negative.      Please see the history of present illness.    All other systems reviewed and are negative.  EKGs/Labs/Other Studies Reviewed:    The following studies were reviewed today:   EKG:  EKG is not ordered today.   Nuclear medicine stress test on June 12, 2019: Nuclear stress EF: 62%. The left ventricular ejection fraction is normal (55-65%). There was no ST segment  deviation noted during stress. No T wave inversion was noted during stress. Blood pressure demonstrated a hypertensive response to exercise. The study is normal. This is a low risk study.   Impression:   1.  This is a likely normal myocardial perfusion imaging study without evidence of ischemia/infarction.  There are reduced counts in the inferior wall present on rest imaging, and improved with stress imaging.  The wall motion in this region is normal, suggestive of diaphragm attenuation.  This does not appear to represent prior infarction. 2.  Normal left ventricular function. 3. Hypertensive response to exercise.  4.  Low risk study.  Recent Labs: 12/03/2021: ALT 23; BUN 12; Creatinine, Ser 0.74; Hemoglobin 16.1; Platelets 212.0; Potassium 3.9; Sodium 126; TSH 1.28  Recent Lipid Panel    Component Value Date/Time   CHOL 144 12/03/2021 0952  CHOL 166 03/07/2020 1217   TRIG 57.0 12/03/2021 0952   HDL 70.30 12/03/2021 0952   HDL 72 03/07/2020 1217   CHOLHDL 2 12/03/2021 0952   VLDL 11.4 12/03/2021 0952   LDLCALC 63 12/03/2021 0952   LDLCALC 84 03/07/2020 1217   LDLDIRECT 189.7 12/10/2011 0901         Physical Exam:    VS:  BP 122/88   Pulse 71   Ht '5\' 8"'$  (1.727 m)   Wt 208 lb (94.3 kg)   BMI 31.63 kg/m     Wt Readings from Last 3 Encounters:  05/28/22 208 lb (94.3 kg)  12/23/21 204 lb 9.6 oz (92.8 kg)  12/03/21 205 lb (93 kg)     GEN: Obese, 86 y.o. Caucasian male in NAD.  HEENT: Normal NECK: No JVD; No carotid bruits CARDIAC: RRR S1/S2, no murmurs, rubs, gallops; 2+ peripheral pulses throughout, strong and equal bilaterally RESPIRATORY:  Clear and diminished to auscultation without rales, wheezing or rhonchi  MUSCULOSKELETAL:  No edema; No deformity  SKIN: Thin skin, Warm and dry NEUROLOGIC:  Alert and oriented x 3 PSYCHIATRIC:  Normal affect   ASSESSMENT:    1. Coronary artery disease involving native heart without angina pectoris, unspecified vessel or  lesion type   2. Aortic atherosclerosis ([place])   3. Hypertension, unspecified type   4. Hyperlipidemia, unspecified hyperlipidemia type   5. OSA on CPAP   6. Daytime sleepiness    PLAN:    In order of problems listed above:  CAD, aortic atherosclerosis NST normal in 2020.  Stable with no anginal symptoms. No indication for ischemic evaluation.  Continue Zetia, has recently stopped Crestor.  I recommended repeating blood work, and he wants to defer this to his PCP who he will see later this month.  Recommend obtaining FLP, CMET at this PCP appointment.  No medication changes at this time. Heart healthy diet and regular cardiovascular exercise encouraged.   3. Hypertension BP stable and well controlled. BP 122/78 today.  Continue amlodipine.  Recommend obtaining CMET at PCP follow-up later this month. Heart healthy diet and regular cardiovascular exercise encouraged.   4. Hyperlipidemia History of statin intolerance. Tolerating Zetia well.  Will add Lipitor and Crestor to allergy list and d/c Crestor.  Recommend obtaining FLP and CMET at next follow-up visit with PCP. If LDL is elevated, would recommend initiating PCSK9i or fenofibrate 160 mg daily if TG levels are also elevated along with LDL. May need Lipid Clinic referral.  Continue current medication regimen. Heart healthy diet and regular cardiovascular exercise encouraged.   5. OSA on CPAP Encouraged continued compliance.   6. Daytime sleepiness Most likely d/t physical deconditioning or hx of OSA. Heart healthy diet and regular cardiovascular exercise encouraged. Recommend obtaining CBC and TSH when f/u with PCP.   7. Disposition: Follow-up with Dr. Oval Linsey in 6 months or sooner if anything changes.    Medication Adjustments/Labs and Tests Ordered: Current medicines are reviewed at length with the patient today.  Concerns regarding medicines are outlined above.  No orders of the defined types were placed in this encounter.  No  orders of the defined types were placed in this encounter.   Patient Instructions  Medication Instructions:  Your Physician recommend you continue on your current medication as directed.    *If you need a refill on your cardiac medications before your next appointment, please call your pharmacy*  Follow-Up: At [facility], you and your health needs are our priority.  As part of our continuing mission to provide you with exceptional heart care, we have created designated Provider Care Teams.  These Care Teams include your primary Cardiologist (physician) and Advanced Practice Providers (APPs -  Physician Assistants and Nurse Practitioners) who all work together to provide you with the care you need, when you need it.  We recommend signing up for the patient portal called "MyChart".  Sign up information is provided on this After Visit Summary.  MyChart is used to connect with patients for Virtual Visits (Telemedicine).  Patients are able to view lab/test results, encounter notes, upcoming appointments, etc.  Non-urgent messages can be sent to your provider as well.   To learn more about what you can do with MyChart, go to [email or web address].    Your next appointment:   6 month(s)  The format for your next appointment:   In Person  Provider:   Skeet Latch, MD  Other Instructions Heart Healthy Diet Recommendations: A low-salt diet is recommended. Meats should be grilled, baked, or boiled. Avoid fried foods. Focus on lean protein sources like fish or chicken with vegetables and fruits. The American Heart Association is a Microbiologist!  American Heart Association Diet and Lifeystyle Recommendations   Exercise recommendations: The American Heart Association recommends 150 minutes of moderate intensity exercise weekly. Try 30 minutes of moderate intensity exercise 4-5 times per week. This could include walking, jogging, or swimming.   Important Information About  Sugar         Signed, Finis Bud, NP  05/28/2022 10:45 AM    [place]

## 2022-05-28 NOTE — Patient Instructions (Signed)
Medication Instructions:  Your Physician recommend you continue on your current medication as directed.    *If you need a refill on your cardiac medications before your next appointment, please call your pharmacy*  Follow-Up: At [facility], you and your health needs are our priority.  As part of our continuing mission to provide you with exceptional heart care, we have created designated Provider Care Teams.  These Care Teams include your primary Cardiologist (physician) and Advanced Practice Providers (APPs -  Physician Assistants and Nurse Practitioners) who all work together to provide you with the care you need, when you need it.  We recommend signing up for the patient portal called "MyChart".  Sign up information is provided on this After Visit Summary.  MyChart is used to connect with patients for Virtual Visits (Telemedicine).  Patients are able to view lab/test results, encounter notes, upcoming appointments, etc.  Non-urgent messages can be sent to your provider as well.   To learn more about what you can do with MyChart, go to [email or web address].    Your next appointment:   6 month(s)  The format for your next appointment:   In Person  Provider:   Skeet Latch, MD  Other Instructions Heart Healthy Diet Recommendations: A low-salt diet is recommended. Meats should be grilled, baked, or boiled. Avoid fried foods. Focus on lean protein sources like fish or chicken with vegetables and fruits. The American Heart Association is a Microbiologist!  American Heart Association Diet and Lifeystyle Recommendations   Exercise recommendations: The American Heart Association recommends 150 minutes of moderate intensity exercise weekly. Try 30 minutes of moderate intensity exercise 4-5 times per week. This could include walking, jogging, or swimming.   Important Information About Sugar

## 2022-06-02 ENCOUNTER — Encounter: Payer: Self-pay | Admitting: Internal Medicine

## 2022-06-02 NOTE — Progress Notes (Signed)
Subjective:    Patient ID: Jason Compton, male    DOB: May 24, 1936, 86 y.o.   MRN: [id number]     HPI Jason Compton is here for follow up of his chronic medical problems, including CAD, htn, hld, prediabetes, OSA, h/o prostate ca  He is no longer taking Crestor secondary to side effects.  He is still taking the Zetia.   Cough - since jan - no change.   Trying to taper off omeprazole.  Increasing pepcid and decreasing omeprazole.  Cough not improved or worse with changes in medication.  Cough is primarily dry.  On occasion his wife has heard a wheeze with the cough, but that is not frequent.  No shortness of breath.  He may have a little postnasal drainage, but nothing significant.  He denies any feeling of reflux.  He does feel like the back of his mouth/throat is dry and that is where the cough originates.  He does drink a lot of fluids.  Last night his cough was worse, but they were staying in a hotel and he felt like something was in the air.  He has noticed that when he is around exhaust fumes that makes it worse.   Medications and allergies reviewed with patient and updated if appropriate.  Current Outpatient Medications on File Prior to Visit  Medication Sig Dispense Refill   amLODipine (NORVASC) 5 MG tablet Take 1 tablet (5 mg total) by mouth daily. Please keep your upcoming appointment for refills. 100 tablet 0   cholecalciferol (VITAMIN D) 1000 UNITS tablet Take 1,000 Units by mouth daily.     cycloSPORINE (RESTASIS) 0.05 % ophthalmic emulsion Place 1 drop into both eyes 2 (two) times daily.     ezetimibe (ZETIA) 10 MG tablet TAKE 1 TABLET BY MOUTH DAILY 90 tablet 3   famotidine (PEPCID) 10 MG tablet Take 10 mg by mouth daily.     hydrochlorothiazide (HYDRODIURIL) 25 MG tablet TAKE 1 TABLET BY MOUTH DAILY 90 tablet 2   Multiple Vitamin (MULTIVITAMIN) tablet Take 1 tablet by mouth daily.     omeprazole (PRILOSEC) 20 MG capsule Take 20 mg by mouth daily.     OVER THE COUNTER  MEDICATION hydroEyes 2 tablets twice a day     vitamin C (ASCORBIC ACID) 500 MG tablet Take 500 mg by mouth daily.     No current facility-administered medications on file prior to visit.     Review of Systems  Constitutional:  Negative for chills and fever.  Respiratory:  Positive for cough and wheezing (rare with cough). Negative for chest tightness and shortness of breath.   Cardiovascular:  Negative for chest pain, palpitations and leg swelling.  Neurological:  Negative for light-headedness and headaches.       Objective:   Vitals:   06/03/22 0855  BP: 112/80  Pulse: 74  Temp: 98.9 F (37.2 C)  SpO2: 98%   BP Readings from Last 3 Encounters:  06/03/22 112/80  05/28/22 122/88  12/23/21 128/70   Wt Readings from Last 3 Encounters:  06/03/22 208 lb (94.3 kg)  05/28/22 208 lb (94.3 kg)  12/23/21 204 lb 9.6 oz (92.8 kg)   Body mass index is 31.63 kg/m.    Physical Exam Constitutional:      General: He is not in acute distress.    Appearance: Normal appearance. He is not ill-appearing.  HENT:     Head: Normocephalic and atraumatic.  Eyes:     Conjunctiva/sclera: Conjunctivae normal.  Cardiovascular:     Rate and Rhythm: Normal rate and regular rhythm.     Heart sounds: Normal heart sounds. No murmur heard. Pulmonary:     Effort: Pulmonary effort is normal. No respiratory distress.     Breath sounds: Normal breath sounds. No wheezing or rales.  Musculoskeletal:     Right lower leg: No edema.     Left lower leg: No edema.  Skin:    General: Skin is warm and dry.     Findings: No rash.  Neurological:     Mental Status: He is alert. Mental status is at baseline.  Psychiatric:        Mood and Affect: Mood normal.        Lab Results  Component Value Date   WBC 5.0 12/03/2021   HGB 16.1 12/03/2021   HCT 46.1 12/03/2021   PLT 212.0 12/03/2021   GLUCOSE 102 (H) 12/03/2021   CHOL 144 12/03/2021   TRIG 57.0 12/03/2021   HDL 70.30 12/03/2021   LDLDIRECT  189.7 12/10/2011   LDLCALC 63 12/03/2021   ALT 23 12/03/2021   AST 20 12/03/2021   NA 126 (L) 12/03/2021   K 3.9 12/03/2021   CL 90 (L) 12/03/2021   CREATININE 0.74 12/03/2021   BUN 12 12/03/2021   CO2 30 12/03/2021   TSH 1.28 12/03/2021   PSA 6.98 (H) 10/03/2015   HGBA1C 6.0 12/03/2021     Assessment & Plan:    See Problem List for Assessment and Plan of chronic medical problems.

## 2022-06-02 NOTE — Patient Instructions (Addendum)
     Blood work was ordered.  Have a chest xray today.     Medications changes include :   none   A referral was ordered for ENT [facility].     Someone from that office will call you to schedule an appointment.    Return in about 6 months (around 12/03/2022) for Physical Exam.

## 2022-06-03 ENCOUNTER — Ambulatory Visit (INDEPENDENT_AMBULATORY_CARE_PROVIDER_SITE_OTHER): Payer: Medicare Other | Admitting: Internal Medicine

## 2022-06-03 ENCOUNTER — Ambulatory Visit (INDEPENDENT_AMBULATORY_CARE_PROVIDER_SITE_OTHER): Payer: Medicare Other

## 2022-06-03 VITALS — BP 112/80 | HR 74 | Temp 98.9°F | Ht 68.0 in | Wt 208.0 lb

## 2022-06-03 DIAGNOSIS — R053 Chronic cough: Secondary | ICD-10-CM

## 2022-06-03 DIAGNOSIS — R7303 Prediabetes: Secondary | ICD-10-CM | POA: Diagnosis not present

## 2022-06-03 DIAGNOSIS — E7800 Pure hypercholesterolemia, unspecified: Secondary | ICD-10-CM | POA: Diagnosis not present

## 2022-06-03 DIAGNOSIS — I2510 Atherosclerotic heart disease of native coronary artery without angina pectoris: Secondary | ICD-10-CM

## 2022-06-03 DIAGNOSIS — I10 Essential (primary) hypertension: Secondary | ICD-10-CM | POA: Diagnosis not present

## 2022-06-03 DIAGNOSIS — G4733 Obstructive sleep apnea (adult) (pediatric): Secondary | ICD-10-CM

## 2022-06-03 DIAGNOSIS — R059 Cough, unspecified: Secondary | ICD-10-CM | POA: Diagnosis not present

## 2022-06-03 LAB — COMPREHENSIVE METABOLIC PANEL
ALT: 18 U/L (ref 0–53)
AST: 19 U/L (ref 0–37)
Albumin: 4 g/dL (ref 3.5–5.2)
Alkaline Phosphatase: 58 U/L (ref 39–117)
BUN: 16 mg/dL (ref 6–23)
CO2: 27 mEq/L (ref 19–32)
Calcium: 9.2 mg/dL (ref 8.4–10.5)
Chloride: 99 mEq/L (ref 96–112)
Creatinine, Ser: 0.81 mg/dL (ref 0.40–1.50)
GFR: 81.19 mL/min (ref >60.00)
Glucose, Bld: 102 mg/dL — ABNORMAL HIGH (ref 70–99)
Potassium: 4 mEq/L (ref 3.5–5.1)
Sodium: 134 mEq/L — ABNORMAL LOW (ref 135–145)
Total Bilirubin: 0.9 mg/dL (ref 0.2–1.2)
Total Protein: 6.9 g/dL (ref 6.0–8.3)

## 2022-06-03 LAB — CBC WITH DIFFERENTIAL/PLATELET
Basophils Absolute: 0.1 10*3/uL (ref 0.0–0.1)
Basophils Relative: 1.1 % (ref 0.0–3.0)
Eosinophils Absolute: 0.1 10*3/uL (ref 0.0–0.7)
Eosinophils Relative: 1.1 % (ref 0.0–5.0)
HCT: 45 % (ref 39.0–52.0)
Hemoglobin: 15.2 g/dL (ref 13.0–17.0)
Lymphocytes Relative: 17.8 % (ref 12.0–46.0)
Lymphs Abs: 1 10*3/uL (ref 0.7–4.0)
MCHC: 33.8 g/dL (ref 30.0–36.0)
MCV: 99.9 fl (ref 78.0–100.0)
Monocytes Absolute: 0.7 10*3/uL (ref 0.1–1.0)
Monocytes Relative: 11.8 % (ref 3.0–12.0)
Neutro Abs: 3.8 10*3/uL (ref 1.4–7.7)
Neutrophils Relative %: 68.2 % (ref 43.0–77.0)
Platelets: 223 10*3/uL (ref 150.0–400.0)
RBC: 4.5 Mil/uL (ref 4.22–5.81)
RDW: 13.1 % (ref 11.5–15.5)
WBC: 5.6 10*3/uL (ref 4.0–10.5)

## 2022-06-03 LAB — LIPID PANEL
Cholesterol: 203 mg/dL — ABNORMAL HIGH (ref 0–200)
HDL: 69.7 mg/dL (ref >39.00)
LDL Cholesterol: 123 mg/dL — ABNORMAL HIGH (ref 0–99)
NonHDL: 133.57
Total CHOL/HDL Ratio: 3
Triglycerides: 53 mg/dL (ref 0.0–149.0)
VLDL: 10.6 mg/dL (ref 0.0–40.0)

## 2022-06-03 LAB — HEMOGLOBIN A1C: Hgb A1c MFr Bld: 6 % (ref 4.6–6.5)

## 2022-06-03 LAB — TSH: TSH: 1.24 u[IU]/mL (ref 0.35–5.50)

## 2022-06-03 NOTE — Assessment & Plan Note (Signed)
Chronic Discussed some of the most common causes of cough-PND/allergy, GERD, dryness, neurogenic Discussed that he could try an antihistamine for a month to see if that helps The cough has not gotten worse since trying to taper off the PPI and he denies GERD, but discussed he could have silent GERD Discussed this is more of a rule out process than anything Will check chest x-ray today Referral to ENT We will hold off on increasing the PPI since he is trying to get off of it because of potential long-term side effects Discussed possibly trying an antihistamine or trying his wife's albuterol to see if that helps

## 2022-06-03 NOTE — Assessment & Plan Note (Signed)
Chronic Using CPAP nightly 

## 2022-06-03 NOTE — Assessment & Plan Note (Addendum)
Chronic Following with cardiology No symptoms consistent with angina Continue Zetia 10 mg daily Continue regular exercise

## 2022-06-03 NOTE — Assessment & Plan Note (Signed)
Chronic Regular exercise and healthy diet encouraged Check lipid panel  Continue Zetia 10 mg daily 

## 2022-06-03 NOTE — Assessment & Plan Note (Signed)
Chronic Blood pressure well controlled CMP Continue amlodipine 5 mg daily, HCTZ 25 mg daily

## 2022-06-03 NOTE — Assessment & Plan Note (Signed)
Chronic Check a1c Low sugar / carb diet Stressed regular exercise  

## 2022-06-04 ENCOUNTER — Encounter (HOSPITAL_BASED_OUTPATIENT_CLINIC_OR_DEPARTMENT_OTHER): Payer: Self-pay

## 2022-06-04 DIAGNOSIS — E785 Hyperlipidemia, unspecified: Secondary | ICD-10-CM

## 2022-06-08 ENCOUNTER — Ambulatory Visit: Payer: Medicare Other | Attending: Cardiovascular Disease | Admitting: Pharmacist

## 2022-06-08 ENCOUNTER — Encounter: Payer: Self-pay | Admitting: Pharmacist

## 2022-06-08 DIAGNOSIS — E7800 Pure hypercholesterolemia, unspecified: Secondary | ICD-10-CM | POA: Diagnosis not present

## 2022-06-08 DIAGNOSIS — I2510 Atherosclerotic heart disease of native coronary artery without angina pectoris: Secondary | ICD-10-CM

## 2022-06-08 NOTE — Patient Instructions (Signed)
It was nice meeting you today  We would like your LDL (bad cholesterol) to be less than 70  We would like to start you on a new medication called Repatha which is a medication you would take once every 2 weeks  I will complete the prior authorization for you and contact you when it is approved  Once you start the medication you can discontinue your ezetimibe and we will recheck your cholesterol in 2-3 months  Please let us know if you have any questions  [name], PharmD, [place], [place], [place], [place], [place], [address] Phone: [phone], Fax: [phone]

## 2022-06-08 NOTE — Progress Notes (Signed)
Patient ID: [name]                 DOB: 14-Nov-1935                    MRN: [id number]     HPI: Jason Compton is a 86 y.o. male patient referred to lipid clinic by Karen Chafe and Laurann Montana. PMH is significant for HTN, CAD, OSA, and statin intolerance.  Previously managed on rosuvastatin which was slowly tapered to 3 days a week.  LDL was controlled but he began feeling joint pains which became progressively worse. Held rosuvastatin and myalgias went away quickly.  Patient presents today to discuss other lipid lowering options. Read online there was a daily injection but he is not interested in that and he and his wife are frequent travelers since retirement.  Denies chest pain, SOB. Drinks wine socially.   Current Medications:  Ezetimibe '10mg'$  daily  Intolerances:  Atorvastatin Rosuvastatin  Risk Factors:  CAD HLD  LDL goal: <70  Labs: TC 203, Trigs 53, HDL 69, LDL 123 (06/03/22)  Past Medical History:  Diagnosis Date   Aortic atherosclerosis (HCC)    CAD (coronary artery disease)    Daytime sleepiness    Exogenous obesity    GERD (gastroesophageal reflux disease)    History of hiatal hernia    Hyperlipidemia    Hypertension    OSA on CPAP    Prostate cancer (HCC)    PVC's (premature ventricular contractions)    Skin cancer     Current Outpatient Medications on File Prior to Visit  Medication Sig Dispense Refill   amLODipine (NORVASC) 5 MG tablet Take 1 tablet (5 mg total) by mouth daily. Please keep your upcoming appointment for refills. 100 tablet 0   cholecalciferol (VITAMIN D) 1000 UNITS tablet Take 1,000 Units by mouth daily.     cycloSPORINE (RESTASIS) 0.05 % ophthalmic emulsion Place 1 drop into both eyes 2 (two) times daily.     ezetimibe (ZETIA) 10 MG tablet TAKE 1 TABLET BY MOUTH DAILY 90 tablet 3   famotidine (PEPCID) 10 MG tablet Take 10 mg by mouth daily.     hydrochlorothiazide (HYDRODIURIL) 25 MG tablet TAKE 1 TABLET BY MOUTH DAILY  90 tablet 2   Multiple Vitamin (MULTIVITAMIN) tablet Take 1 tablet by mouth daily.     omeprazole (PRILOSEC) 20 MG capsule Take 20 mg by mouth daily.     OVER THE COUNTER MEDICATION hydroEyes 2 tablets twice a day     vitamin C (ASCORBIC ACID) 500 MG tablet Take 500 mg by mouth daily.     No current facility-administered medications on file prior to visit.    Allergies  Allergen Reactions   Altace [Ramipril]     Doesn't recall reaction   Lipitor [Atorvastatin] Other (See Comments)    Joint pain, myalgias   Rosuvastatin Other (See Comments)    Myalgias    Assessment/Plan:  1. Hyperlipidemia - Patient LDL 123 which is above goal of <70. Prviously at goal on rosuvastatin however can no longer tolerate. Discussed next lipid lowering steps and reassured patient we would not recommend daily injections. Discussed Nexletol vs Repatha and patient is interested in [place] when learning it was every 2 weeks.  Using [facility], educated patient on mechanism of action, storage, site selection, administration and possible adverse effects.  Will complete PA and contact patient when approved. Will d/c Zetia once patient starts Repatha. Recheck lipid panel in  2-3 months.   [name], PharmD, BCACP, [place], [place], [place], [place], [address] Phone: [phone], Fax: [phone]

## 2022-06-09 ENCOUNTER — Telehealth: Payer: Self-pay | Admitting: Pharmacist

## 2022-06-09 DIAGNOSIS — I700 Atherosclerosis of aorta: Secondary | ICD-10-CM

## 2022-06-09 DIAGNOSIS — I2510 Atherosclerotic heart disease of native coronary artery without angina pectoris: Secondary | ICD-10-CM

## 2022-06-09 DIAGNOSIS — E7800 Pure hypercholesterolemia, unspecified: Secondary | ICD-10-CM

## 2022-06-09 MED ORDER — REPATHA SURECLICK 140 MG/ML ~~LOC~~ SOAJ: 1.0000 mL | SUBCUTANEOUS | 11 refills | Status: DC

## 2022-06-09 NOTE — Addendum Note (Signed)
Addended by: Rollen Sox on: 06/09/2022 01:31 PM   Modules accepted: Orders

## 2022-06-09 NOTE — Telephone Encounter (Signed)
PA approved through 12/09/22. Sent to pharmacy

## 2022-06-09 NOTE — Telephone Encounter (Signed)
PA for Repatha submitted.  Key: [id number]

## 2022-06-10 ENCOUNTER — Encounter: Payer: Self-pay | Admitting: Internal Medicine

## 2022-06-18 ENCOUNTER — Other Ambulatory Visit (HOSPITAL_BASED_OUTPATIENT_CLINIC_OR_DEPARTMENT_OTHER): Payer: Self-pay

## 2022-06-18 MED ORDER — AREXVY 120 MCG/0.5ML IM SUSR
INTRAMUSCULAR | 0 refills | Status: DC
  Filled 2022-06-18: qty 0.5, 1d supply, fill #0

## 2022-07-13 DIAGNOSIS — L570 Actinic keratosis: Secondary | ICD-10-CM | POA: Diagnosis not present

## 2022-07-13 DIAGNOSIS — Z85828 Personal history of other malignant neoplasm of skin: Secondary | ICD-10-CM | POA: Diagnosis not present

## 2022-07-13 DIAGNOSIS — K219 Gastro-esophageal reflux disease without esophagitis: Secondary | ICD-10-CM | POA: Diagnosis not present

## 2022-07-13 DIAGNOSIS — L738 Other specified follicular disorders: Secondary | ICD-10-CM | POA: Diagnosis not present

## 2022-07-13 DIAGNOSIS — L821 Other seborrheic keratosis: Secondary | ICD-10-CM | POA: Diagnosis not present

## 2022-07-30 ENCOUNTER — Other Ambulatory Visit: Payer: Self-pay

## 2022-07-30 MED ORDER — COVID-19 MRNA VAC-TRIS(PFIZER) 30 MCG/0.3ML IM SUSY
0.3000 mL | PREFILLED_SYRINGE | Freq: Once | INTRAMUSCULAR | 0 refills | Status: AC
  Filled 2022-07-30: qty 0.3, 1d supply, fill #0

## 2022-08-24 ENCOUNTER — Other Ambulatory Visit: Payer: Self-pay | Admitting: Cardiovascular Disease

## 2022-08-24 NOTE — Telephone Encounter (Signed)
Rx request sent to pharmacy.  

## 2022-09-02 DIAGNOSIS — L249 Irritant contact dermatitis, unspecified cause: Secondary | ICD-10-CM | POA: Diagnosis not present

## 2022-09-02 DIAGNOSIS — M7981 Nontraumatic hematoma of soft tissue: Secondary | ICD-10-CM | POA: Diagnosis not present

## 2022-09-02 DIAGNOSIS — Z85828 Personal history of other malignant neoplasm of skin: Secondary | ICD-10-CM | POA: Diagnosis not present

## 2022-09-09 ENCOUNTER — Other Ambulatory Visit: Payer: Self-pay

## 2022-09-09 DIAGNOSIS — E7800 Pure hypercholesterolemia, unspecified: Secondary | ICD-10-CM

## 2022-09-09 DIAGNOSIS — I2510 Atherosclerotic heart disease of native coronary artery without angina pectoris: Secondary | ICD-10-CM

## 2022-09-09 DIAGNOSIS — I700 Atherosclerosis of aorta: Secondary | ICD-10-CM

## 2022-09-10 DIAGNOSIS — I700 Atherosclerosis of aorta: Secondary | ICD-10-CM | POA: Diagnosis not present

## 2022-09-10 DIAGNOSIS — I2510 Atherosclerotic heart disease of native coronary artery without angina pectoris: Secondary | ICD-10-CM | POA: Diagnosis not present

## 2022-09-10 DIAGNOSIS — E7800 Pure hypercholesterolemia, unspecified: Secondary | ICD-10-CM | POA: Diagnosis not present

## 2022-09-10 LAB — LIPID PANEL
Chol/HDL Ratio: 2.7 ratio (ref 0.0–5.0)
Cholesterol, Total: 197 mg/dL (ref 100–199)
HDL: 74 mg/dL (ref >39)
LDL Chol Calc (NIH): 110 mg/dL — ABNORMAL HIGH (ref 0–99)
Triglycerides: 69 mg/dL (ref 0–149)
VLDL Cholesterol Cal: 13 mg/dL (ref 5–40)

## 2022-09-20 ENCOUNTER — Encounter (HOSPITAL_BASED_OUTPATIENT_CLINIC_OR_DEPARTMENT_OTHER): Payer: Self-pay | Admitting: *Deleted

## 2022-09-23 ENCOUNTER — Telehealth: Payer: Self-pay | Admitting: Cardiovascular Disease

## 2022-09-23 DIAGNOSIS — E785 Hyperlipidemia, unspecified: Secondary | ICD-10-CM

## 2022-09-23 DIAGNOSIS — E7800 Pure hypercholesterolemia, unspecified: Secondary | ICD-10-CM

## 2022-09-23 NOTE — Telephone Encounter (Signed)
Left message to call back  

## 2022-09-23 NOTE — Telephone Encounter (Signed)
Patient was returning call. Please advise ?

## 2022-09-23 NOTE — Telephone Encounter (Signed)
   Pt  said, he is returning Melinda's call

## 2022-09-24 NOTE — Telephone Encounter (Signed)
Returned call to patient, he states that he started using the [place] on November 1st and has taken it on every 1st and 15th of the month since then and has not missed a dose. Advised patient that I would pass this information on to Dr. Oval Linsey and the pharmacy team for review.   Of note he states that when we call him back he would like to get scheduled for his annual visit with Dr. Oval Linsey but does not have his calendar at the moment.

## 2022-10-01 ENCOUNTER — Other Ambulatory Visit: Payer: Self-pay | Admitting: Cardiovascular Disease

## 2022-10-01 DIAGNOSIS — I10 Essential (primary) hypertension: Secondary | ICD-10-CM

## 2022-10-04 NOTE — Addendum Note (Signed)
Addended by: Gerald Stabs on: 10/04/2022 08:22 AM   Modules accepted: Orders

## 2022-10-07 NOTE — Telephone Encounter (Signed)
Message sent to scheduling to get appointment with Threasa Beards D

## 2022-10-08 ENCOUNTER — Telehealth: Payer: Self-pay | Admitting: Cardiovascular Disease

## 2022-10-08 NOTE — Telephone Encounter (Signed)
Routed to sleep studies team to review request

## 2022-10-08 NOTE — Telephone Encounter (Signed)
Patient stated he has had his CPAP machine 5 years ago ([place]) and he wants to get a new CPAP machine.

## 2022-10-11 NOTE — Telephone Encounter (Signed)
Message left a new CPAP machine order has been submitted to [facility] via [facility] portal. Call me back if any questions and or concerns.

## 2022-10-15 NOTE — Telephone Encounter (Signed)
Patient requested to see Threasa Beards D since he had seen him before  Appointment scheduled

## 2022-10-25 ENCOUNTER — Ambulatory Visit (HOSPITAL_BASED_OUTPATIENT_CLINIC_OR_DEPARTMENT_OTHER): Payer: Medicare Other | Admitting: Cardiovascular Disease

## 2022-10-25 ENCOUNTER — Encounter (HOSPITAL_BASED_OUTPATIENT_CLINIC_OR_DEPARTMENT_OTHER): Payer: Self-pay | Admitting: Cardiovascular Disease

## 2022-10-25 VITALS — BP 118/72 | HR 71 | Ht 66.0 in | Wt 207.7 lb

## 2022-10-25 DIAGNOSIS — E7800 Pure hypercholesterolemia, unspecified: Secondary | ICD-10-CM | POA: Diagnosis not present

## 2022-10-25 DIAGNOSIS — I2510 Atherosclerotic heart disease of native coronary artery without angina pectoris: Secondary | ICD-10-CM | POA: Diagnosis not present

## 2022-10-25 DIAGNOSIS — Z5181 Encounter for therapeutic drug level monitoring: Secondary | ICD-10-CM | POA: Diagnosis not present

## 2022-10-25 DIAGNOSIS — G4733 Obstructive sleep apnea (adult) (pediatric): Secondary | ICD-10-CM | POA: Diagnosis not present

## 2022-10-25 DIAGNOSIS — I10 Essential (primary) hypertension: Secondary | ICD-10-CM

## 2022-10-25 DIAGNOSIS — I700 Atherosclerosis of aorta: Secondary | ICD-10-CM | POA: Diagnosis not present

## 2022-10-25 NOTE — Patient Instructions (Signed)
Medication Instructions:  RESUME ZETIA 10 MG DAILY   *If you need a refill on your cardiac medications before your next appointment, please call your pharmacy*  Lab Work: FASTING LP/CMET IN 2-3 MONTHS PRIOR TO FOLLOW UP WITH PHARM D   If you have labs (blood work) drawn today and your tests are completely normal, you will receive your results only by: [place] (if you have MyChart) OR A paper copy in the mail If you have any lab test that is abnormal or we need to change your treatment, we will call you to review the results.  Testing/Procedures: NONE   Follow-Up: At [facility], you and your health needs are our priority.  As part of our continuing mission to provide you with exceptional heart care, we have created designated Provider Care Teams.  These Care Teams include your primary Cardiologist (physician) and Advanced Practice Providers (APPs -  Physician Assistants and Nurse Practitioners) who all work together to provide you with the care you need, when you need it.  We recommend signing up for the patient portal called "MyChart".  Sign up information is provided on this After Visit Summary.  MyChart is used to connect with patients for Virtual Visits (Telemedicine).  Patients are able to view lab/test results, encounter notes, upcoming appointments, etc.  Non-urgent messages can be sent to your provider as well.   To learn more about what you can do with MyChart, go to [email or web address].    Your next appointment:   6 month(s)  Provider:   Skeet Latch, MD   RESCHEDULE PHARM D FOR 2-3 MONTHS

## 2022-10-25 NOTE — Assessment & Plan Note (Signed)
He has been using the CPAP faithfully.  He feels much better since using it.

## 2022-10-25 NOTE — Assessment & Plan Note (Signed)
Nonobstructive CAD.  He has no exertional symptoms.  Encouraged to work on increasing his exercise to get at least 150 minutes of exercise weekly.  Continue Repatha.  Lipids are now uncontrolled since he stopped both his statin and Zetia.  Resume Zetia and repeat lipids and a CMP in 2 to 3 months.

## 2022-10-25 NOTE — Assessment & Plan Note (Signed)
Blood pressure is very well-controlled on amlodipine and HCTZ.

## 2022-10-25 NOTE — Assessment & Plan Note (Signed)
Lipid management as above.

## 2022-10-25 NOTE — Progress Notes (Signed)
Cardiology Office Note  Date:  10/25/2022   ID:  Jason Compton, DOB 06-Oct-1935, MRN [id number]  PCP:  Binnie Rail, MD  Cardiologist:   Skeet Latch, MD   No chief complaint on file.  History of Present Illness: Jason Compton is a 87 y.o. male with non-obstructive CAD, hypertension, hyperlipidemia, OSA on CPAP, prostate cancer, and Celiac disease who presents for follow up.  Jason Compton was previously a patient of [name].  Jason Compton continued to use his CPAP regularly.  Jason Compton notes that Jason Compton sleeps more soundly and has less nocturia on the CPAP machine. Jason Compton increased atorvastatin to '80mg'$ .  Jason Compton developed aching in his hips and arms.  Jason Compton followed up with Raquel and started on Zetia.  Jason Compton slowly had been titrating atorvastatin. Jason Compton was taking it four times per week. However Jason Compton still had myalgias so we switched to rosuvastatin. Since that time Jason Compton has been doing well.  Jason Compton reported increasing exertional dyspnea.  Jason Compton had a Lexiscan Myoview 05/2019 that was negative for ischemia.    At his last visit his lipids were uncontrolled but Jason Compton wanted to work on diet and exercise before making medication changes. Today, Jason Compton is accompanied by his wife. Jason Compton states Jason Compton is feeling terrific. His LDL is still elevated. Currently Jason Compton is on Repatha. Of note, Jason Compton had stopped taking the ezetimibe at the same time Jason Compton stopped the rosuvastatin. Since stopping his statin Jason Compton had significant improvement in his myalgias. For months, Jason Compton had a persistent cough and Jason Compton saw an ENT. Jason Compton stopped drinking coffee and tea and his coughing resolved after a few weeks. Recently, his breathing has been stable; although Jason Compton notes Jason Compton may be somewhat short of breath with climbing stairs. On average Jason Compton is walking about 5000 steps a day. His exercise has been limited by his busy schedule. Lately they are constructing a new house, which requires frequent commuting. Additionally his wife notes that Jason Compton has a bulging lesion of his abdomen that  seems consistent with a ventral hernia. This has been mostly noticeable when Jason Compton leans back, such as when getting into bed at night. Jason Compton denies any associated pain or discomfort. Jason Compton is using his CPAP every night. His urinary frequency has improved. Jason Compton denies any palpitations, chest pain, or peripheral edema. No lightheadedness, headaches, syncope, orthopnea, or PND.   Past Medical History:  Diagnosis Date   Aortic atherosclerosis (HCC)    CAD (coronary artery disease)    Daytime sleepiness    Exogenous obesity    GERD (gastroesophageal reflux disease)    History of hiatal hernia    Hyperlipidemia    Hypertension    OSA on CPAP    Prostate cancer (HCC)    PVC's (premature ventricular contractions)    Skin cancer     Past Surgical History:  Procedure Laterality Date   CARDIOVASCULAR STRESS TEST  04/18/2009   NORMAL   CATARACT EXTRACTION       Current Outpatient Medications  Medication Sig Dispense Refill   amLODipine (NORVASC) 5 MG tablet TAKE 1 TABLET BY MOUTH DAILY 100 tablet 2   cholecalciferol (VITAMIN D) 1000 UNITS tablet Take 1,000 Units by mouth daily.     cycloSPORINE (RESTASIS) 0.05 % ophthalmic emulsion Place 1 drop into both eyes 2 (two) times daily.     Evolocumab (REPATHA SURECLICK) [id number] MG/ML SOAJ Inject 140 mg into the skin every 14 (fourteen) days. 2 mL 11   ezetimibe (ZETIA) 10 MG tablet Take 10  mg by mouth daily.     famotidine (PEPCID) 10 MG tablet Take 10 mg by mouth daily.     hydrochlorothiazide (HYDRODIURIL) 25 MG tablet TAKE 1 TABLET BY MOUTH DAILY 100 tablet 2   Multiple Vitamin (MULTIVITAMIN) tablet Take 1 tablet by mouth daily.     omeprazole (PRILOSEC) 20 MG capsule Take 20 mg by mouth. Monday, Wednesday friday     OVER THE COUNTER MEDICATION hydroEyes 2 tablets twice a day     RSV vaccine recomb adjuvanted (AREXVY) 120 MCG/0.5ML injection Inject into the muscle. 0.5 mL 0   vitamin C (ASCORBIC ACID) 500 MG tablet Take 500 mg by mouth daily.     No  current facility-administered medications for this visit.    Allergies:   Altace [ramipril], Lipitor [atorvastatin], and Rosuvastatin    Social History:  The patient  reports that Jason Compton has never smoked. Jason Compton has never used smokeless tobacco. Jason Compton reports current alcohol use. Jason Compton reports that Jason Compton does not use drugs.   Family History:  The patient's family history includes Cancer in his father; Hypertension in his brother; Osteoporosis in his mother; Skin cancer in his brother and father.    ROS:   Please see the history of present illness.    (+) Mild exertional shortness of breath (+) Ventral hernia All other systems are reviewed and negative.    PHYSICAL EXAM: VS:  BP 118/72 (BP Location: Left Arm, Patient Position: Sitting, Cuff Size: Normal)   Pulse 71   Ht '5\' 6"'$  (1.676 m)   Wt 207 lb 11.2 oz (94.2 kg)   BMI 33.52 kg/m  , BMI Body mass index is 33.52 kg/m. GENERAL:  Well appearing HEENT: Pupils equal round and reactive, fundi not visualized, oral mucosa unremarkable NECK:  No jugular venous distention, waveform within normal limits, carotid upstroke brisk and symmetric, no bruits LUNGS:  Clear to auscultation bilaterally HEART:  RRR.  PMI not displaced or sustained,S1 and S2 within normal limits, no S3, no S4, no clicks, no rubs, no murmurs ABD:  Flat, positive bowel sounds normal in frequency in pitch, no bruits, no rebound, no guarding, no midline pulsatile mass, no hepatomegaly, no splenomegaly. Large ventral hernia noted. EXT:  2 plus pulses throughout, no edema, no cyanosis no clubbing SKIN:  No rashes no nodules NEURO:  Cranial nerves II through XII grossly intact, motor grossly intact throughout PSYCH:  Cognitively intact, oriented to person place and time  EKG:   EKG is personally reviewed. 10/25/2022:  Sinus rhythm. Rate 71 bpm. LAFB. 11/05/20: Sinus rhythm.  Rate 71 bpm.  LAFB. 11/28/19: Sinus rhythm.  Rate 63 bmp.  LAD.  Low voltage.  Prior inferior infarct.   05/08/2018:  Sinus rhythm.  LAFB.  Cannot rule out prior inferior infarct.  Low voltage. 11/19/16: Sinus rhythm. Rate 72 bpm. Low voltage limb leads. Prior inferior infarct 03/30/16:  sinus rhythm rate 71 bpm.  Cannot rule out prior inferior infarct.  Left axis deviation.  Unchanged from prior.     Lexiscan Myoview 05/2019: Nuclear stress EF: 62%. The left ventricular ejection fraction is normal (55-65%). There was no ST segment deviation noted during stress. No T wave inversion was noted during stress. Blood pressure demonstrated a hypertensive response to exercise. The study is normal. This is a low risk study.  Recent Labs: 06/03/2022: ALT 18; BUN 16; Creatinine, Ser 0.81; Hemoglobin 15.2; Platelets 223.0; Potassium 4.0; Sodium 134; TSH 1.24    Lipid Panel    Component Value Date/Time  CHOL 197 09/10/2022 1035   TRIG 69 09/10/2022 1035   HDL 74 09/10/2022 1035   CHOLHDL 2.7 09/10/2022 1035   CHOLHDL 3 06/03/2022 1144   VLDL 10.6 06/03/2022 1144   LDLCALC 110 (H) 09/10/2022 1035   LDLDIRECT 189.7 12/10/2011 0901      Wt Readings from Last 3 Encounters:  10/25/22 207 lb 11.2 oz (94.2 kg)  06/03/22 208 lb (94.3 kg)  05/28/22 208 lb (94.3 kg)      ASSESSMENT AND PLAN:  OSA on CPAP Jason Compton has been using the CPAP faithfully.  Jason Compton feels much better since using it.   Coronary artery disease Nonobstructive CAD.  Jason Compton has no exertional symptoms.  Encouraged to work on increasing his exercise to get at least 150 minutes of exercise weekly.  Continue Repatha.  Lipids are now uncontrolled since Jason Compton stopped both his statin and Zetia.  Resume Zetia and repeat lipids and a CMP in 2 to 3 months.  Aortic atherosclerosis (HCC) Lipid management as above.  Hypertension Blood pressure is very well-controlled on amlodipine and HCTZ.   Disposition:   FU with Rachel Rison C. Oval Linsey, MD, [facility] in 6 months.  Medication Adjustments/Labs and Tests Ordered: Current medicines are reviewed at length with the  patient today.  Concerns regarding medicines are outlined above.   Orders Placed This Encounter  Procedures   Lipid panel   Comprehensive metabolic panel   EKG [id number]   No orders of the defined types were placed in this encounter.  Patient Instructions  Medication Instructions:  RESUME ZETIA 10 MG DAILY   *If you need a refill on your cardiac medications before your next appointment, please call your pharmacy*  Lab Work: FASTING LP/CMET IN 2-3 MONTHS PRIOR TO FOLLOW UP WITH PHARM D   If you have labs (blood work) drawn today and your tests are completely normal, you will receive your results only by: [place] (if you have MyChart) OR A paper copy in the mail If you have any lab test that is abnormal or we need to change your treatment, we will call you to review the results.  Testing/Procedures: NONE   Follow-Up: At [facility], you and your health needs are our priority.  As part of our continuing mission to provide you with exceptional heart care, we have created designated Provider Care Teams.  These Care Teams include your primary Cardiologist (physician) and Advanced Practice Providers (APPs -  Physician Assistants and Nurse Practitioners) who all work together to provide you with the care you need, when you need it.  We recommend signing up for the patient portal called "MyChart".  Sign up information is provided on this After Visit Summary.  MyChart is used to connect with patients for Virtual Visits (Telemedicine).  Patients are able to view lab/test results, encounter notes, upcoming appointments, etc.  Non-urgent messages can be sent to your provider as well.   To learn more about what you can do with MyChart, go to [email or web address].    Your next appointment:   6 month(s)  Provider:   Skeet Latch, MD   RESCHEDULE PHARM D FOR 2-3 MONTHS    I,Mathew Stumpf,acting as a scribe for Skeet Latch, MD.,have documented all relevant  documentation on the behalf of Skeet Latch, MD,as directed by  Skeet Latch, MD while in the presence of Skeet Latch, MD.  I, [place] Oval Linsey, MD have reviewed all documentation for this visit.  The documentation of the exam, diagnosis, procedures, and orders on 10/25/2022  are all accurate and complete.   Signed, Lizann Edelman C. Oval Linsey, MD, [facility]  10/25/2022 1:00 PM    [place]

## 2022-10-26 DIAGNOSIS — G4733 Obstructive sleep apnea (adult) (pediatric): Secondary | ICD-10-CM | POA: Diagnosis not present

## 2022-11-01 DIAGNOSIS — L988 Other specified disorders of the skin and subcutaneous tissue: Secondary | ICD-10-CM | POA: Diagnosis not present

## 2022-11-01 DIAGNOSIS — C4449 Other specified malignant neoplasm of skin of scalp and neck: Secondary | ICD-10-CM | POA: Diagnosis not present

## 2022-11-09 DIAGNOSIS — C4442 Squamous cell carcinoma of skin of scalp and neck: Secondary | ICD-10-CM | POA: Diagnosis not present

## 2022-11-10 ENCOUNTER — Ambulatory Visit: Payer: Medicare Other

## 2022-11-26 DIAGNOSIS — G4733 Obstructive sleep apnea (adult) (pediatric): Secondary | ICD-10-CM | POA: Diagnosis not present

## 2022-12-08 ENCOUNTER — Encounter: Payer: Self-pay | Admitting: Internal Medicine

## 2022-12-08 NOTE — Progress Notes (Unsigned)
Subjective:    Patient ID: Jason Compton, male    DOB: October 19, 1935, 87 y.o.   MRN: [id number]     HPI [place] is here for a physical exam and his chronic medical problems.   Doing well - no concerns.   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     Cough  saw ENT - advised to stop coffee, tea, etoh, peppermint, chocolate - stopped 4 and within a few weeks the cough resolved.    Medications and allergies reviewed with patient and updated if appropriate.  Current Outpatient Medications on File Prior to Visit  Medication Sig Dispense Refill   amLODipine (NORVASC) 5 MG tablet TAKE 1 TABLET BY MOUTH DAILY 100 tablet 2   cholecalciferol (VITAMIN D) 1000 UNITS tablet Take 1,000 Units by mouth daily.     cycloSPORINE (RESTASIS) 0.05 % ophthalmic emulsion Place 1 drop into both eyes 2 (two) times daily.     Evolocumab (REPATHA SURECLICK) 140 MG/ML SOAJ Inject 140 mg into the skin every 14 (fourteen) days. 2 mL 11   ezetimibe (ZETIA) 10 MG tablet Take 10 mg by mouth daily.     famotidine (PEPCID) 10 MG tablet Take 10 mg by mouth daily.     hydrochlorothiazide (HYDRODIURIL) 25 MG tablet TAKE 1 TABLET BY MOUTH DAILY 100 tablet 2   Multiple Vitamin (MULTIVITAMIN) tablet Take 1 tablet by mouth daily.     omeprazole (PRILOSEC) 20 MG capsule Take 20 mg by mouth. Monday, Wednesday friday     OVER THE COUNTER MEDICATION hydroEyes 2 tablets twice a day     vitamin C (ASCORBIC ACID) 500 MG tablet Take 500 mg by mouth daily.     No current facility-administered medications on file prior to visit.    Review of Systems  Constitutional:  Negative for fever.  Eyes:  Negative for visual disturbance.  Respiratory:  Negative for cough, shortness of breath and wheezing.   Cardiovascular:  Negative for chest pain, palpitations and leg swelling.  Gastrointestinal:  Negative for abdominal pain, blood in stool, constipation and diarrhea.       No gerd  Genitourinary:  Negative for dysuria.   Musculoskeletal:  Negative for arthralgias and back pain.  Skin:  Negative for rash.  Neurological:  Negative for light-headedness and headaches.  Psychiatric/Behavioral:  Negative for dysphoric mood. The patient is not nervous/anxious.        Objective:   Vitals:   12/09/22 1034  BP: 128/72  Pulse: 78  Temp: 98.6 F (37 C)  SpO2: 98%   Filed Weights   12/09/22 1034  Weight: 205 lb 12.8 oz (93.4 kg)   Body mass index is 33.22 kg/m.  BP Readings from Last 3 Encounters:  12/09/22 128/72  10/25/22 118/72  06/03/22 112/80    Wt Readings from Last 3 Encounters:  12/09/22 205 lb 12.8 oz (93.4 kg)  10/25/22 207 lb 11.2 oz (94.2 kg)  06/03/22 208 lb (94.3 kg)      Physical Exam Constitutional: He appears well-developed and well-nourished. No distress.  HENT:  Head: Normocephalic and atraumatic.  Right Ear: External ear normal.  Left Ear: External ear normal.  Normal ear canals and TM b/l  Mouth/Throat: Oropharynx is clear and moist. Eyes: Conjunctivae and EOM are normal.  Neck: Neck supple. No tracheal deviation present. No thyromegaly present.  No carotid bruit  Cardiovascular: Normal rate, regular rhythm, normal heart sounds and intact distal pulses.   No murmur heard.  No lower extremity  edema. Pulmonary/Chest: Effort normal and breath sounds normal. No respiratory distress. He has no wheezes. He has no rales.  Abdominal: Soft. He exhibits no distension. There is no tenderness.  Genitourinary: deferred  Lymphadenopathy:   He has no cervical adenopathy.  Skin: Skin is warm and dry. He is not diaphoretic.  Psychiatric: He has a normal mood and affect. His behavior is normal.         Assessment & Plan:   Physical exam: Screening blood work  ordered Exercise   not regular  - active Weight  weight is down  Substance abuse   none   Reviewed recommended immunizations.   Health Maintenance  Topic Date Due   Medicare Annual Wellness (AWV)  Never done    COVID-19 Vaccine (7 - 2023-24 season) 12/24/2022 (Originally 09/24/2022)   INFLUENZA VACCINE  03/24/2023   DTaP/Tdap/Td (2 - Td or Tdap) 11/10/2027   Pneumonia Vaccine 38+ Years old  Completed   Zoster Vaccines- Shingrix  Completed   HPV VACCINES  Aged Out     See Problem List for Assessment and Plan of chronic medical problems.

## 2022-12-08 NOTE — Patient Instructions (Addendum)
Blood work was ordered.   The lab is on the first floor.    Medications changes include :   none      Return in about 6 months (around 06/10/2023) for follow up.    Health Maintenance, Male Adopting a healthy lifestyle and getting preventive care are important in promoting health and wellness. Ask your health care provider about: The right schedule for you to have regular tests and exams. Things you can do on your own to prevent diseases and keep yourself healthy. What should I know about diet, weight, and exercise? Eat a healthy diet  Eat a diet that includes plenty of vegetables, fruits, low-fat dairy products, and lean protein. Do not eat a lot of foods that are high in solid fats, added sugars, or sodium. Maintain a healthy weight Body mass index (BMI) is a measurement that can be used to identify possible weight problems. It estimates body fat based on height and weight. Your health care provider can help determine your BMI and help you achieve or maintain a healthy weight. Get regular exercise Get regular exercise. This is one of the most important things you can do for your health. Most adults should: Exercise for at least 150 minutes each week. The exercise should increase your heart rate and make you sweat (moderate-intensity exercise). Do strengthening exercises at least twice a week. This is in addition to the moderate-intensity exercise. Spend less time sitting. Even light physical activity can be beneficial. Watch cholesterol and blood lipids Have your blood tested for lipids and cholesterol at 85 years of age, then have this test every 5 years. You may need to have your cholesterol levels checked more often if: Your lipid or cholesterol levels are high. You are older than 85 years of age. You are at high risk for heart disease. What should I know about cancer screening? Many types of cancers can be detected early and may often be prevented. Depending on  your health history and family history, you may need to have cancer screening at various ages. This may include screening for: Colorectal cancer. Prostate cancer. Skin cancer. Lung cancer. What should I know about heart disease, diabetes, and high blood pressure? Blood pressure and heart disease High blood pressure causes heart disease and increases the risk of stroke. This is more likely to develop in people who have high blood pressure readings or are overweight. Talk with your health care provider about your target blood pressure readings. Have your blood pressure checked: Every 3-5 years if you are 46-25 years of age. Every year if you are 63 years old or older. If you are between the ages of 6 and 11 and are a current or former smoker, ask your health care provider if you should have a one-time screening for abdominal aortic aneurysm (AAA). Diabetes Have regular diabetes screenings. This checks your fasting blood sugar level. Have the screening done: Once every three years after age 70 if you are at a normal weight and have a low risk for diabetes. More often and at a younger age if you are overweight or have a high risk for diabetes. What should I know about preventing infection? Hepatitis B If you have a higher risk for hepatitis B, you should be screened for this virus. Talk with your health care provider to find out if you are at risk for hepatitis B infection. Hepatitis C Blood testing is recommended for: Everyone born from [birth year] through 1963. Anyone with  known risk factors for hepatitis C. Sexually transmitted infections (STIs) You should be screened each year for STIs, including gonorrhea and chlamydia, if: You are sexually active and are younger than 85 years of age. You are older than 85 years of age and your health care provider tells you that you are at risk for this type of infection. Your sexual activity has changed since you were last screened, and you are at increased  risk for chlamydia or gonorrhea. Ask your health care provider if you are at risk. Ask your health care provider about whether you are at high risk for HIV. Your health care provider may recommend a prescription medicine to help prevent HIV infection. If you choose to take medicine to prevent HIV, you should first get tested for HIV. You should then be tested every 3 months for as long as you are taking the medicine. Follow these instructions at home: Alcohol use Do not drink alcohol if your health care provider tells you not to drink. If you drink alcohol: Limit how much you have to 0-2 drinks a day. Know how much alcohol is in your drink. In the [place] one drink equals one 12 oz bottle of beer (355 mL), one 5 oz glass of wine (148 mL), or one 1 oz glass of hard liquor (44 mL). Lifestyle Do not use any products that contain nicotine or tobacco. These products include cigarettes, chewing tobacco, and vaping devices, such as e-cigarettes. If you need help quitting, ask your health care provider. Do not use street drugs. Do not share needles. Ask your health care provider for help if you need support or information about quitting drugs. General instructions Schedule regular health, dental, and eye exams. Stay current with your vaccines. Tell your health care provider if: You often feel depressed. You have ever been abused or do not feel safe at home. Summary Adopting a healthy lifestyle and getting preventive care are important in promoting health and wellness. Follow your health care provider's instructions about healthy diet, exercising, and getting tested or screened for diseases. Follow your health care provider's instructions on monitoring your cholesterol and blood pressure. This information is not intended to replace advice given to you by your health care provider. Make sure you discuss any questions you have with your health care provider. Document Revised: 12/29/2020 Document  Reviewed: 12/29/2020 Elsevier Patient Education  [place].

## 2022-12-09 ENCOUNTER — Ambulatory Visit (INDEPENDENT_AMBULATORY_CARE_PROVIDER_SITE_OTHER): Payer: Medicare Other | Admitting: Internal Medicine

## 2022-12-09 VITALS — BP 128/72 | HR 78 | Temp 98.6°F | Ht 66.0 in | Wt 205.8 lb

## 2022-12-09 DIAGNOSIS — I2510 Atherosclerotic heart disease of native coronary artery without angina pectoris: Secondary | ICD-10-CM

## 2022-12-09 DIAGNOSIS — R7303 Prediabetes: Secondary | ICD-10-CM

## 2022-12-09 DIAGNOSIS — E7800 Pure hypercholesterolemia, unspecified: Secondary | ICD-10-CM | POA: Diagnosis not present

## 2022-12-09 DIAGNOSIS — R053 Chronic cough: Secondary | ICD-10-CM | POA: Diagnosis not present

## 2022-12-09 DIAGNOSIS — Z0000 Encounter for general adult medical examination without abnormal findings: Secondary | ICD-10-CM | POA: Diagnosis not present

## 2022-12-09 DIAGNOSIS — K219 Gastro-esophageal reflux disease without esophagitis: Secondary | ICD-10-CM

## 2022-12-09 DIAGNOSIS — G4733 Obstructive sleep apnea (adult) (pediatric): Secondary | ICD-10-CM

## 2022-12-09 DIAGNOSIS — I10 Essential (primary) hypertension: Secondary | ICD-10-CM

## 2022-12-09 DIAGNOSIS — I700 Atherosclerosis of aorta: Secondary | ICD-10-CM | POA: Diagnosis not present

## 2022-12-09 LAB — CBC WITH DIFFERENTIAL/PLATELET
Basophils Absolute: 0 10*3/uL (ref 0.0–0.1)
Basophils Relative: 1 % (ref 0.0–3.0)
Eosinophils Absolute: 0.1 10*3/uL (ref 0.0–0.7)
Eosinophils Relative: 1.9 % (ref 0.0–5.0)
HCT: 49.4 % (ref 39.0–52.0)
Hemoglobin: 16.8 g/dL (ref 13.0–17.0)
Lymphocytes Relative: 28.5 % (ref 12.0–46.0)
Lymphs Abs: 1.2 10*3/uL (ref 0.7–4.0)
MCHC: 33.9 g/dL (ref 30.0–36.0)
MCV: 100 fl (ref 78.0–100.0)
Monocytes Absolute: 0.5 10*3/uL (ref 0.1–1.0)
Monocytes Relative: 11.9 % (ref 3.0–12.0)
Neutro Abs: 2.4 10*3/uL (ref 1.4–7.7)
Neutrophils Relative %: 56.7 % (ref 43.0–77.0)
Platelets: 213 10*3/uL (ref 150.0–400.0)
RBC: 4.95 Mil/uL (ref 4.22–5.81)
RDW: 13.4 % (ref 11.5–15.5)
WBC: 4.2 10*3/uL (ref 4.0–10.5)

## 2022-12-09 LAB — COMPREHENSIVE METABOLIC PANEL
ALT: 17 U/L (ref 0–53)
AST: 18 U/L (ref 0–37)
Albumin: 4.1 g/dL (ref 3.5–5.2)
Alkaline Phosphatase: 53 U/L (ref 39–117)
BUN: 14 mg/dL (ref 6–23)
CO2: 31 mEq/L (ref 19–32)
Calcium: 9.1 mg/dL (ref 8.4–10.5)
Chloride: 95 mEq/L — ABNORMAL LOW (ref 96–112)
Creatinine, Ser: 0.78 mg/dL (ref 0.40–1.50)
GFR: 81.82 mL/min (ref >60.00)
Glucose, Bld: 104 mg/dL — ABNORMAL HIGH (ref 70–99)
Potassium: 4.2 mEq/L (ref 3.5–5.1)
Sodium: 133 mEq/L — ABNORMAL LOW (ref 135–145)
Total Bilirubin: 1.1 mg/dL (ref 0.2–1.2)
Total Protein: 6.9 g/dL (ref 6.0–8.3)

## 2022-12-09 LAB — LIPID PANEL
Cholesterol: 160 mg/dL (ref 0–200)
HDL: 72.6 mg/dL (ref >39.00)
LDL Cholesterol: 74 mg/dL (ref 0–99)
NonHDL: 87.25
Total CHOL/HDL Ratio: 2
Triglycerides: 67 mg/dL (ref 0.0–149.0)
VLDL: 13.4 mg/dL (ref 0.0–40.0)

## 2022-12-09 LAB — HEMOGLOBIN A1C: Hgb A1c MFr Bld: 5.8 % (ref 4.6–6.5)

## 2022-12-09 LAB — TSH: TSH: 0.98 u[IU]/mL (ref 0.35–5.50)

## 2022-12-09 NOTE — Assessment & Plan Note (Signed)
Chronic Blood pressure well controlled CMP Continue amlodipine 5 mg daily, HCTZ 25 mg daily 

## 2022-12-09 NOTE — Assessment & Plan Note (Signed)
Chronic Following with cardiology No symptoms consistent with angina Continue Zetia 10 mg daily, repatha 140 mg Q 14 days Continue regular exercise Cbc, cmp, lipids, tsh

## 2022-12-09 NOTE — Assessment & Plan Note (Signed)
Chronic Regular exercise and healthy diet encouraged Check lipid panel , cmp Continue Zetia 10 mg daily, repatha 140 mg q 14 days

## 2022-12-09 NOTE — Assessment & Plan Note (Signed)
Chronic Using cpap nightly Feels benefit from cpap and tolerating it well

## 2022-12-09 NOTE — Assessment & Plan Note (Signed)
Chronic Check a1c Low sugar / carb diet Stressed regular exercise  

## 2022-12-09 NOTE — Assessment & Plan Note (Signed)
Saw ENT Advised to stop etoh, coffee, tea, pepermint and chocolate - stopped all but etoh and cough resolved in a few weeks

## 2022-12-09 NOTE — Assessment & Plan Note (Signed)
Chronic Continue healthy diet, regular exercise Continue repatha 140 mg Q14 days, continue Zetia 10 mg daily  Lab Results  Component Value Date   LDLCALC 110 (H) 09/10/2022   Check lipids, cmp

## 2022-12-09 NOTE — Assessment & Plan Note (Addendum)
Chronic GERD controlled Continue omeprazole 20 mg twice a week - tapering off Continue famotidine

## 2022-12-23 ENCOUNTER — Other Ambulatory Visit (HOSPITAL_COMMUNITY): Payer: Self-pay

## 2022-12-26 DIAGNOSIS — G4733 Obstructive sleep apnea (adult) (pediatric): Secondary | ICD-10-CM | POA: Diagnosis not present

## 2023-01-18 ENCOUNTER — Telehealth: Payer: Self-pay | Admitting: Pharmacist

## 2023-01-18 NOTE — Telephone Encounter (Signed)
Spoke with patient. Has appt scheduled tomorrow with lipid clinic. Currently on Repatha 140mg  and Zetia 10mg  once daily. However recently restarted on ezetimibe and follow up labs were taken ~1 month after restarting. Had 50 point reduction in LDL after one month of Zetia. Patient is statin intolerant. Advised we can cancel appointment tomorrow and recheck lipid panel in about 2-3 more months to see if LDL is back at goal. Was 74 on  12/09/22

## 2023-01-19 ENCOUNTER — Ambulatory Visit: Payer: Medicare Other

## 2023-01-24 DIAGNOSIS — G4733 Obstructive sleep apnea (adult) (pediatric): Secondary | ICD-10-CM | POA: Diagnosis not present

## 2023-01-26 DIAGNOSIS — G4733 Obstructive sleep apnea (adult) (pediatric): Secondary | ICD-10-CM | POA: Diagnosis not present

## 2023-02-21 ENCOUNTER — Other Ambulatory Visit: Payer: Self-pay | Admitting: Cardiovascular Disease

## 2023-02-21 NOTE — Telephone Encounter (Signed)
Rx(s) sent to pharmacy electronically.  

## 2023-02-25 DIAGNOSIS — G4733 Obstructive sleep apnea (adult) (pediatric): Secondary | ICD-10-CM | POA: Diagnosis not present

## 2023-03-28 DIAGNOSIS — G4733 Obstructive sleep apnea (adult) (pediatric): Secondary | ICD-10-CM | POA: Diagnosis not present

## 2023-04-26 ENCOUNTER — Other Ambulatory Visit: Payer: Self-pay | Admitting: Cardiovascular Disease

## 2023-04-26 ENCOUNTER — Other Ambulatory Visit (HOSPITAL_BASED_OUTPATIENT_CLINIC_OR_DEPARTMENT_OTHER): Payer: Self-pay

## 2023-04-26 MED ORDER — FLUAD 0.5 ML IM SUSY
PREFILLED_SYRINGE | INTRAMUSCULAR | 0 refills | Status: DC
  Filled 2023-04-26: qty 0.5, 1d supply, fill #0

## 2023-04-26 NOTE — Telephone Encounter (Signed)
Rx request sent to pharmacy.  

## 2023-04-29 ENCOUNTER — Other Ambulatory Visit: Payer: Self-pay | Admitting: Cardiovascular Disease

## 2023-04-29 DIAGNOSIS — E7800 Pure hypercholesterolemia, unspecified: Secondary | ICD-10-CM

## 2023-04-29 DIAGNOSIS — I2510 Atherosclerotic heart disease of native coronary artery without angina pectoris: Secondary | ICD-10-CM

## 2023-04-29 DIAGNOSIS — I700 Atherosclerosis of aorta: Secondary | ICD-10-CM

## 2023-05-05 ENCOUNTER — Ambulatory Visit (HOSPITAL_BASED_OUTPATIENT_CLINIC_OR_DEPARTMENT_OTHER): Payer: Medicare Other | Admitting: Cardiovascular Disease

## 2023-05-05 ENCOUNTER — Encounter (HOSPITAL_BASED_OUTPATIENT_CLINIC_OR_DEPARTMENT_OTHER): Payer: Self-pay | Admitting: Cardiovascular Disease

## 2023-05-05 VITALS — BP 128/64 | HR 67 | Ht 66.0 in | Wt 207.8 lb

## 2023-05-05 DIAGNOSIS — R7303 Prediabetes: Secondary | ICD-10-CM | POA: Diagnosis not present

## 2023-05-05 DIAGNOSIS — I700 Atherosclerosis of aorta: Secondary | ICD-10-CM | POA: Diagnosis not present

## 2023-05-05 DIAGNOSIS — G4733 Obstructive sleep apnea (adult) (pediatric): Secondary | ICD-10-CM | POA: Diagnosis not present

## 2023-05-05 DIAGNOSIS — I2510 Atherosclerotic heart disease of native coronary artery without angina pectoris: Secondary | ICD-10-CM | POA: Diagnosis not present

## 2023-05-05 DIAGNOSIS — Z5181 Encounter for therapeutic drug level monitoring: Secondary | ICD-10-CM

## 2023-05-05 DIAGNOSIS — I10 Essential (primary) hypertension: Secondary | ICD-10-CM

## 2023-05-05 DIAGNOSIS — E7800 Pure hypercholesterolemia, unspecified: Secondary | ICD-10-CM | POA: Diagnosis not present

## 2023-05-05 NOTE — Progress Notes (Deleted)
  Cardiology Office Note:  .   Date:  05/05/2023  ID:  Jason Compton, DOB 06/29/1936, MRN [id number] PCP: Pincus Sanes, MD  [facility] Providers Cardiologist:  Chilton Si, MD Sleep Medicine:  Nicki Guadalajara, MD { Click to update primary MD,subspecialty MD or APP then REFRESH:1}   History of Present Illness: .   Jason Compton is a 87 y.o. male with non-obstructive CAD, hypertension, hyperlipidemia, OSA on CPAP, prostate cancer, and Celiac disease who presents for follow up.  Jason Compton was previously a patient of Dr. Patty Sermons.  Jason Compton increased atorvastatin to 80mg .  He developed aching in his hips and arms.  He followed up with our pharmacist and started on Zetia.  He slowly had been titrating atorvastatin. He was taking it four times per week. However he still had myalgias so we switched to rosuvastatin. Since that time he has been doing well.  He reported increasing exertional dyspnea.  He had a Lexiscan Myoview 05/2019 that was negative for ischemia.     Lipids were uncontrolled but he wanted to work on diet and exercise before making medication changes.  He stopped taking the ezetimibe at the same time he stopped the rosuvastatin. Since stopping his statin he had significant improvement in his myalgias. For months, he had a persistent cough and he saw an ENT. He stopped drinking coffee and tea and his coughing resolved after a few weeks.  We discussed the importance of increased exercise and Zetia was resumed.   ROS: ***  Studies Reviewed: .        *** Risk Assessment/Calculations:   {Does this patient have ATRIAL FIBRILLATION?:[phone]} No BP recorded.  {Refresh Note OR Click here to enter BP  :1}***       Physical Exam:   VS:  There were no vitals taken for this visit.   Wt Readings from Last 3 Encounters:  12/09/22 205 lb 12.8 oz (93.4 kg)  10/25/22 207 lb 11.2 oz (94.2 kg)  06/03/22 208 lb (94.3 kg)    GEN: Well nourished, well developed in no  acute distress NECK: No JVD; No carotid bruits CARDIAC: ***RRR, no murmurs, rubs, gallops RESPIRATORY:  Clear to auscultation without rales, wheezing or rhonchi  ABDOMEN: Soft, non-tender, non-distended EXTREMITIES:  No edema; No deformity   ASSESSMENT AND PLAN: .   ***    {Are you ordering a CV Procedure (e.g. stress test, cath, DCCV, TEE, etc)?   Press F2        :[id number]}  Dispo: ***  Signed, Chilton Si, MD

## 2023-05-05 NOTE — Patient Instructions (Signed)
Medication Instructions:  Your physician recommends that you continue on your current medications as directed. Please refer to the Current Medication list given to you today.   *If you need a refill on your cardiac medications before your next appointment, please call your pharmacy*  Lab Work: FASTING LP/CMET ABOUT 1 WEEK PRIOR TO FOLLOW UP IN 6 MONTHS   If you have labs (blood work) drawn today and your tests are completely normal, you will receive your results only by: MyChart Message (if you have MyChart) OR A paper copy in the mail If you have any lab test that is abnormal or we need to change your treatment, we will call you to review the results.  Testing/Procedures: NONE  Follow-Up: At [facility], you and your health needs are our priority.  As part of our continuing mission to provide you with exceptional heart care, we have created designated Provider Care Teams.  These Care Teams include your primary Cardiologist (physician) and Advanced Practice Providers (APPs -  Physician Assistants and Nurse Practitioners) who all work together to provide you with the care you need, when you need it.  We recommend signing up for the patient portal called "MyChart".  Sign up information is provided on this After Visit Summary.  MyChart is used to connect with patients for Virtual Visits (Telemedicine).  Patients are able to view lab/test results, encounter notes, upcoming appointments, etc.  Non-urgent messages can be sent to your provider as well.   To learn more about what you can do with MyChart, go to [email or web address].    Your next appointment:   6 month(s)  Provider:   Chilton Si, MD or Gillian Shields, NP    You have been referred to PREP  PAM OR LORA WILL REACH OUT TO YOU

## 2023-05-05 NOTE — Progress Notes (Signed)
Cardiology Office Note:  .    Date:  05/05/2023  ID:  Jason Compton, DOB September 07, 1935, MRN [id number] PCP: Pincus Sanes, MD  [facility] Providers Cardiologist:  Chilton Si, MD Sleep Medicine:  Nicki Guadalajara, MD     History of Present Illness: .    Jason Compton is a 87 y.o. male with non-obstructive CAD, hypertension, hyperlipidemia, OSA on CPAP, prostate cancer, and Celiac disease who presents for follow up.  Mr. Dalesandro was previously a patient of Dr. Patty Sermons.  Mr. Correa increased atorvastatin to 80mg .  He developed aching in his hips and arms.  He followed up with our pharmacist and started on Zetia.  He slowly had been titrating atorvastatin. He was taking it four times per week. However he still had myalgias so we switched to rosuvastatin. Since that time he has been doing well.  He reported increasing exertional dyspnea.  He had a Lexiscan Myoview 05/2019 that was negative for ischemia.     Lipids were uncontrolled but he wanted to work on diet and exercise before making medication changes.  He stopped taking the ezetimibe at the same time he stopped the rosuvastatin. Since stopping his statin he had significant improvement in his myalgias. For months, he had a persistent cough and he saw an ENT. He stopped drinking coffee and tea and his coughing resolved after a few weeks.  We discussed the importance of increased exercise and Zetia was resumed.  Today, he has been feeling well overall. His main concern is increased daytime somnolence. His wife has noticed that he appears more fatigued and somnolent at midday, and he has been taking 20-30 minute naps a little more often. Usually this occurs after lunch in a quiet setting, not if he is active or in a meeting. After dinner he sometimes falls asleep while sitting in his chair. Typically goes to bed around 10 PM and rises 5:30-6:00 AM. He continues to use his CPAP at least 6 hours a night 85% of the time. There have  not been any severe apneic events recorded. For the last 18 months he has been in the process of building and moving into a new home. He has not had as much formal exercise lately. In the office his blood pressure is 144/77 initially, and 128/64 on manual recheck. He denies any palpitations, chest pain, shortness of breath, peripheral edema, lightheadedness, headaches, syncope, orthopnea, or PND.  ROS:  Please see the history of present illness. All other systems are reviewed and negative.  (+) Daytime somnolence (+) Fatigue  Studies Reviewed: .        Risk Assessment/Calculations:             Physical Exam:    VS:  BP 128/64 (BP Location: Right Arm, Patient Position: Sitting, Cuff Size: Normal)   Pulse 67   Ht 5\' 6"  (1.676 m)   Wt 207 lb 12.5 oz (94.2 kg)   SpO2 98%   BMI 33.54 kg/m  , BMI Body mass index is 33.54 kg/m. GENERAL:  Well appearing HEENT: Pupils equal round and reactive, fundi not visualized, oral mucosa unremarkable NECK:  No jugular venous distention, waveform within normal limits, carotid upstroke brisk and symmetric, no bruits, no thyromegaly LUNGS:  Clear to auscultation bilaterally HEART:  RRR.  PMI not displaced or sustained,S1 and S2 within normal limits, no S3, no S4, no clicks, no rubs, no murmurs ABD:  Flat, positive bowel sounds normal in frequency in pitch, no bruits, no  rebound, no guarding, no midline pulsatile mass, no hepatomegaly, no splenomegaly EXT:  2 plus pulses throughout, trace left ankle edema, no cyanosis no clubbing SKIN:  No rashes no nodules NEURO:  Cranial nerves II through XII grossly intact, motor grossly intact throughout PSYCH:  Cognitively intact, oriented to person place and time  Wt Readings from Last 3 Encounters:  05/05/23 207 lb 12.5 oz (94.2 kg)  12/09/22 205 lb 12.8 oz (93.4 kg)  10/25/22 207 lb 11.2 oz (94.2 kg)     ASSESSMENT AND PLAN: .    # OSA:  # Excessive Daytime Sleepiness Reports of increased daytime  sleepiness and napping. No signs of sleep apnea with good CPAP compliance and readings. Possible contributing factors include age and recent decrease in physical activity due to house move. -Encouraged to increase physical activity. Referred to the [facility] PREP program for structured exercise and diet guidance.  # Hyperlipidemia LDL slightly above target (74, target <70). Currently on Repatha and Zetia. -Continue Repatha and Zetia. Encouraged to improve diet, particularly given recent reliance on fast food due to house move.  # Hypertension Initial blood pressure reading was high, but second reading was within normal range. Currently on hydrochlorothiazide and amlodipine. -Continue hydrochlorothiazide and amlodipine. Monitor blood pressure.  # Mild lower extremity edema Noted mild swelling in one ankle, possibly due to amlodipine or venous insufficiency. No associated discomfort. -No change in treatment. Monitor for any changes or increased discomfort. -Consider compression socks.  Follow-up in six months. Repeat fasting labs (comprehensive metabolic panel and lipids) one week prior to next appointment.              Dispo:  FU with Madsen Riddle C. Duke Salvia, MD, [facility] in 6 months.  I,Mathew Stumpf,acting as a Neurosurgeon for Chilton Si, MD.,have documented all relevant documentation on the behalf of Chilton Si, MD,as directed by  Chilton Si, MD while in the presence of Chilton Si, MD.  I, Henry Utsey C. Duke Salvia, MD have reviewed all documentation for this visit.  The documentation of the exam, diagnosis, procedures, and orders on 05/05/2023 are all accurate and complete.   Signed, Chilton Si, MD

## 2023-05-06 ENCOUNTER — Telehealth: Payer: Self-pay

## 2023-05-06 ENCOUNTER — Other Ambulatory Visit (HOSPITAL_BASED_OUTPATIENT_CLINIC_OR_DEPARTMENT_OTHER): Payer: Self-pay

## 2023-05-06 MED ORDER — COMIRNATY 30 MCG/0.3ML IM SUSY
0.3000 mL | PREFILLED_SYRINGE | Freq: Once | INTRAMUSCULAR | 0 refills | Status: AC
  Filled 2023-05-06: qty 0.3, 1d supply, fill #0

## 2023-05-06 NOTE — Telephone Encounter (Signed)
Call to pt reference PREP referral Interested in Program. Sts likely closest to [facility]. Offered starting 06/13/23 1p-216p however he will be on vacation.  Agreeable to start in Nov instead.  Will call him in Nov to firm up dates and times of class.

## 2023-05-10 DIAGNOSIS — H903 Sensorineural hearing loss, bilateral: Secondary | ICD-10-CM | POA: Diagnosis not present

## 2023-05-15 ENCOUNTER — Other Ambulatory Visit: Payer: Self-pay | Admitting: Internal Medicine

## 2023-05-15 MED ORDER — ZOLPIDEM TARTRATE 10 MG PO TABS: 5.0000 mg | ORAL_TABLET | Freq: Every evening | ORAL | 0 refills | Status: DC | PRN

## 2023-05-15 MED ORDER — AZITHROMYCIN 500 MG PO TABS: 500.0000 mg | ORAL_TABLET | Freq: Every day | ORAL | 0 refills | Status: DC

## 2023-06-13 ENCOUNTER — Encounter: Payer: Self-pay | Admitting: Internal Medicine

## 2023-06-13 NOTE — Patient Instructions (Addendum)
      Blood work was ordered.   The lab is on the first floor.    Medications changes include :   none     Return in about 6 months (around 12/13/2023) for Physical Exam.

## 2023-06-13 NOTE — Progress Notes (Unsigned)
Subjective:    Patient ID: Jason Compton, male    DOB: 03-18-1936, 87 y.o.   MRN: [id number]     HPI Jason Compton is here for follow up of his chronic medical problems.  On vaca - sneezing, mucus from sinus - clear.  Cough day and night.  Exac most by fumes from car.    Psa up a little   s/p radiation    Shuffle alittle more - mild -no difficulty walking- can stop it if he pays attention to it.    Skin ca.  Recent Mohs surgery  Not as strong-muscles not quite as strong.  Uses cpap nightly  Medications and allergies reviewed with patient and updated if appropriate.  Current Outpatient Medications on File Prior to Visit  Medication Sig Dispense Refill   amLODipine (NORVASC) 5 MG tablet TAKE 1 TABLET BY MOUTH DAILY 100 tablet 2   cholecalciferol (VITAMIN D) 1000 UNITS tablet Take 1,000 Units by mouth daily.     cycloSPORINE (RESTASIS) 0.05 % ophthalmic emulsion Place 1 drop into both eyes 2 (two) times daily.     Evolocumab (REPATHA SURECLICK) 140 MG/ML SOAJ ADMINISTER 1 ML UNDER THE SKIN EVERY 14 DAYS 2 mL 6   ezetimibe (ZETIA) 10 MG tablet TAKE 1 TABLET BY MOUTH DAILY 100 tablet 2   famotidine (PEPCID) 20 MG tablet Take 20 mg by mouth daily.     hydrochlorothiazide (HYDRODIURIL) 25 MG tablet TAKE 1 TABLET BY MOUTH DAILY 100 tablet 2   Multiple Vitamin (MULTIVITAMIN) tablet Take 1 tablet by mouth daily.     OVER THE COUNTER MEDICATION hydroEyes 2 tablets twice a day     vitamin C (ASCORBIC ACID) 500 MG tablet Take 500 mg by mouth daily.     No current facility-administered medications on file prior to visit.     Review of Systems  Constitutional:  Negative for fever.  Respiratory:  Positive for cough. Negative for shortness of breath and wheezing.   Cardiovascular:  Positive for leg swelling (mild). Negative for chest pain and palpitations.  Neurological:  Negative for light-headedness and headaches.       Objective:   Vitals:   06/14/23 1002  BP: 126/80   Pulse: 66  Temp: 98.5 F (36.9 C)  SpO2: 97%   BP Readings from Last 3 Encounters:  06/14/23 126/80  05/05/23 128/64  12/09/22 128/72   Wt Readings from Last 3 Encounters:  06/14/23 207 lb (93.9 kg)  05/05/23 207 lb 12.5 oz (94.2 kg)  12/09/22 205 lb 12.8 oz (93.4 kg)   Body mass index is 33.41 kg/m.    Physical Exam Constitutional:      General: He is not in acute distress.    Appearance: Normal appearance. He is not ill-appearing.  HENT:     Head: Normocephalic and atraumatic.  Eyes:     Conjunctiva/sclera: Conjunctivae normal.  Cardiovascular:     Rate and Rhythm: Normal rate and regular rhythm.     Heart sounds: Normal heart sounds.  Pulmonary:     Effort: Pulmonary effort is normal. No respiratory distress.     Breath sounds: Normal breath sounds. No wheezing or rales.  Musculoskeletal:     Right lower leg: Edema (trace) present.     Left lower leg: Edema (trace) present.  Skin:    General: Skin is warm and dry.     Findings: No rash.  Neurological:     Mental Status: He is alert. Mental status is  at baseline.  Psychiatric:        Mood and Affect: Mood normal.        Lab Results  Component Value Date   WBC 4.2 12/09/2022   HGB 16.8 12/09/2022   HCT 49.4 12/09/2022   PLT 213.0 12/09/2022   GLUCOSE 104 (H) 12/09/2022   CHOL 160 12/09/2022   TRIG 67.0 12/09/2022   HDL 72.60 12/09/2022   LDLDIRECT 189.7 12/10/2011   LDLCALC 74 12/09/2022   ALT 17 12/09/2022   AST 18 12/09/2022   NA 133 (L) 12/09/2022   K 4.2 12/09/2022   CL 95 (L) 12/09/2022   CREATININE 0.78 12/09/2022   BUN 14 12/09/2022   CO2 31 12/09/2022   TSH 0.98 12/09/2022   PSA 6.98 (H) 10/03/2015   HGBA1C 5.8 12/09/2022     Assessment & Plan:    See Problem List for Assessment and Plan of chronic medical problems.

## 2023-06-14 ENCOUNTER — Ambulatory Visit: Payer: Medicare Other | Admitting: Internal Medicine

## 2023-06-14 VITALS — BP 126/80 | HR 66 | Temp 98.5°F | Ht 66.0 in | Wt 207.0 lb

## 2023-06-14 DIAGNOSIS — Z85828 Personal history of other malignant neoplasm of skin: Secondary | ICD-10-CM | POA: Diagnosis not present

## 2023-06-14 DIAGNOSIS — G4733 Obstructive sleep apnea (adult) (pediatric): Secondary | ICD-10-CM | POA: Diagnosis not present

## 2023-06-14 DIAGNOSIS — I10 Essential (primary) hypertension: Secondary | ICD-10-CM

## 2023-06-14 DIAGNOSIS — E7800 Pure hypercholesterolemia, unspecified: Secondary | ICD-10-CM | POA: Diagnosis not present

## 2023-06-14 DIAGNOSIS — R7303 Prediabetes: Secondary | ICD-10-CM

## 2023-06-14 DIAGNOSIS — C61 Malignant neoplasm of prostate: Secondary | ICD-10-CM

## 2023-06-14 LAB — LIPID PANEL
Cholesterol: 158 mg/dL (ref 0–200)
HDL: 66.2 mg/dL (ref >39.00)
LDL Cholesterol: 77 mg/dL (ref 0–99)
NonHDL: 91.82
Total CHOL/HDL Ratio: 2
Triglycerides: 75 mg/dL (ref 0.0–149.0)
VLDL: 15 mg/dL (ref 0.0–40.0)

## 2023-06-14 LAB — COMPREHENSIVE METABOLIC PANEL
ALT: 20 U/L (ref 0–53)
AST: 18 U/L (ref 0–37)
Albumin: 4 g/dL (ref 3.5–5.2)
Alkaline Phosphatase: 54 U/L (ref 39–117)
BUN: 10 mg/dL (ref 6–23)
CO2: 31 meq/L (ref 19–32)
Calcium: 9.1 mg/dL (ref 8.4–10.5)
Chloride: 96 meq/L (ref 96–112)
Creatinine, Ser: 0.82 mg/dL (ref 0.40–1.50)
GFR: 80.31 mL/min (ref >60.00)
Glucose, Bld: 103 mg/dL — ABNORMAL HIGH (ref 70–99)
Potassium: 4.3 meq/L (ref 3.5–5.1)
Sodium: 133 meq/L — ABNORMAL LOW (ref 135–145)
Total Bilirubin: 0.9 mg/dL (ref 0.2–1.2)
Total Protein: 6.8 g/dL (ref 6.0–8.3)

## 2023-06-14 LAB — HEMOGLOBIN A1C: Hgb A1c MFr Bld: 5.8 % (ref 4.6–6.5)

## 2023-06-14 NOTE — Assessment & Plan Note (Signed)
Chronic Blood pressure well controlled CMP Continue amlodipine 5 mg daily, HCTZ 25 mg daily 

## 2023-06-14 NOTE — Assessment & Plan Note (Signed)
Chronic S/p radiation treatment Follows with urology regularly-slight increase in PSA recently-being monitored

## 2023-06-14 NOTE — Assessment & Plan Note (Signed)
Chronic Regular exercise and healthy diet encouraged Check lipid panel , cmp Continue Zetia 10 mg daily, repatha 140 mg q 14 days

## 2023-06-14 NOTE — Assessment & Plan Note (Signed)
S/p Mohs surgery Follows with dermatology-Dr. Doreen Beam

## 2023-06-14 NOTE — Assessment & Plan Note (Signed)
Chronic Lab Results  Component Value Date   HGBA1C 5.8 12/09/2022   Check a1c Low sugar / carb diet Stressed regular exercise

## 2023-06-14 NOTE — Assessment & Plan Note (Signed)
Chronic Using cpap nightly Feels benefit from cpap and tolerating it well

## 2023-06-15 DIAGNOSIS — H52223 Regular astigmatism, bilateral: Secondary | ICD-10-CM | POA: Diagnosis not present

## 2023-06-15 DIAGNOSIS — H35033 Hypertensive retinopathy, bilateral: Secondary | ICD-10-CM | POA: Diagnosis not present

## 2023-06-15 DIAGNOSIS — Z961 Presence of intraocular lens: Secondary | ICD-10-CM | POA: Diagnosis not present

## 2023-06-15 DIAGNOSIS — H04123 Dry eye syndrome of bilateral lacrimal glands: Secondary | ICD-10-CM | POA: Diagnosis not present

## 2023-06-28 ENCOUNTER — Telehealth: Payer: Self-pay

## 2023-06-28 NOTE — Telephone Encounter (Signed)
Call to pt to offer next PREP class at [facility] 230p-345p Pt sts he has too much going on to participate. He knows what he should be doing but is not currently due to other commitments  Will remove from contact list.   Should pt change his mind, pls place another referral order.

## 2023-07-04 ENCOUNTER — Other Ambulatory Visit: Payer: Self-pay | Admitting: Cardiovascular Disease

## 2023-07-04 DIAGNOSIS — I10 Essential (primary) hypertension: Secondary | ICD-10-CM

## 2023-07-28 ENCOUNTER — Other Ambulatory Visit (HOSPITAL_COMMUNITY): Payer: Self-pay | Admitting: Urology

## 2023-07-28 DIAGNOSIS — C61 Malignant neoplasm of prostate: Secondary | ICD-10-CM

## 2023-08-03 ENCOUNTER — Telehealth: Payer: Self-pay

## 2023-08-03 MED ORDER — HYDROCOD POLI-CHLORPHE POLI ER 10-8 MG/5ML PO SUER
5.0000 mL | Freq: Two times a day (BID) | ORAL | 0 refills | Status: DC | PRN
Start: 2023-08-03 — End: 2023-08-20

## 2023-08-03 NOTE — Telephone Encounter (Signed)
Rx sent to [facility]

## 2023-08-04 ENCOUNTER — Ambulatory Visit (INDEPENDENT_AMBULATORY_CARE_PROVIDER_SITE_OTHER): Payer: Medicare Other | Admitting: Family Medicine

## 2023-08-04 ENCOUNTER — Encounter: Payer: Self-pay | Admitting: Family Medicine

## 2023-08-04 ENCOUNTER — Ambulatory Visit (INDEPENDENT_AMBULATORY_CARE_PROVIDER_SITE_OTHER): Payer: Medicare Other

## 2023-08-04 VITALS — BP 122/74 | HR 94 | Temp 97.6°F | Ht 66.0 in | Wt 207.0 lb

## 2023-08-04 DIAGNOSIS — R062 Wheezing: Secondary | ICD-10-CM

## 2023-08-04 DIAGNOSIS — R051 Acute cough: Secondary | ICD-10-CM

## 2023-08-04 DIAGNOSIS — R059 Cough, unspecified: Secondary | ICD-10-CM | POA: Diagnosis not present

## 2023-08-04 DIAGNOSIS — J22 Unspecified acute lower respiratory infection: Secondary | ICD-10-CM | POA: Diagnosis not present

## 2023-08-04 DIAGNOSIS — R509 Fever, unspecified: Secondary | ICD-10-CM

## 2023-08-04 LAB — CBC WITH DIFFERENTIAL/PLATELET
Basophils Absolute: 0 10*3/uL (ref 0.0–0.1)
Basophils Relative: 0.3 % (ref 0.0–3.0)
Eosinophils Absolute: 0 10*3/uL (ref 0.0–0.7)
Eosinophils Relative: 0 % (ref 0.0–5.0)
HCT: 46.9 % (ref 39.0–52.0)
Hemoglobin: 16.1 g/dL (ref 13.0–17.0)
Lymphocytes Relative: 8.6 % — ABNORMAL LOW (ref 12.0–46.0)
Lymphs Abs: 0.9 10*3/uL (ref 0.7–4.0)
MCHC: 34.2 g/dL (ref 30.0–36.0)
MCV: 99.9 fL (ref 78.0–100.0)
Monocytes Absolute: 1.1 10*3/uL — ABNORMAL HIGH (ref 0.1–1.0)
Monocytes Relative: 10.1 % (ref 3.0–12.0)
Neutro Abs: 8.8 10*3/uL — ABNORMAL HIGH (ref 1.4–7.7)
Neutrophils Relative %: 81 % — ABNORMAL HIGH (ref 43.0–77.0)
Platelets: 205 10*3/uL (ref 150.0–400.0)
RBC: 4.7 Mil/uL (ref 4.22–5.81)
RDW: 13.4 % (ref 11.5–15.5)
WBC: 10.9 10*3/uL — ABNORMAL HIGH (ref 4.0–10.5)

## 2023-08-04 LAB — POC COVID19 BINAXNOW: SARS Coronavirus 2 Ag: NEGATIVE

## 2023-08-04 LAB — POCT RESPIRATORY SYNCYTIAL VIRUS: RSV Rapid Ag: NEGATIVE

## 2023-08-04 MED ORDER — AMOXICILLIN-POT CLAVULANATE 875-125 MG PO TABS: 1.0000 | ORAL_TABLET | Freq: Two times a day (BID) | ORAL | 0 refills | Status: DC

## 2023-08-04 MED ORDER — ALBUTEROL SULFATE HFA 108 (90 BASE) MCG/ACT IN AERS: 2.0000 | INHALATION_SPRAY | Freq: Four times a day (QID) | RESPIRATORY_TRACT | 0 refills | Status: DC | PRN

## 2023-08-04 MED ORDER — ALBUTEROL SULFATE (2.5 MG/3ML) 0.083% IN NEBU
2.5000 mg | INHALATION_SOLUTION | Freq: Once | RESPIRATORY_TRACT | Status: AC
  Administered 2023-08-04: 2.5 mg via RESPIRATORY_TRACT

## 2023-08-04 MED ORDER — PREDNISONE 20 MG PO TABS
40.0000 mg | ORAL_TABLET | Freq: Every day | ORAL | 0 refills | Status: DC
Start: 2023-08-04 — End: 2023-08-20

## 2023-08-04 MED ORDER — BENZONATATE 200 MG PO CAPS: 200.0000 mg | ORAL_CAPSULE | Freq: Two times a day (BID) | ORAL | 0 refills | Status: DC | PRN

## 2023-08-04 NOTE — Patient Instructions (Signed)
Take the antibiotic and steroids with food and plenty of water.  Use the albuterol inhaler as needed for coughing, wheezing or shortness of breath.  Try the [facility] for cough.  I also recommend over-the-counter Mucinex  Follow-up if you are getting worse or if no improvement in the next 3 to 4 days.

## 2023-08-04 NOTE — Progress Notes (Signed)
Subjective:  Jason Compton is a 87 y.o. male who presents for a 3 day hx of acute cough, wheezing, and mild shortness of breath. Low grade fever. Cough productive of yellow sputum.   No hx of lung disease. Never smoked.  Wife states she has never seen him this sick.   He has taken Mucinex and Tussionex.    ROS as in subjective.   Objective: Vitals:   08/04/23 1429  BP: 122/74  Pulse: 94  Temp: 97.6 F (36.4 C)  SpO2: 95%    General appearance: Alert, WD/WN, no distress, mildly ill appearing                             Skin: warm, no rash                           Head: no sinus tenderness                            Eyes: conjunctiva normal, corneas clear, PERRLA                            Ears: pearly TMs, external ear canals normal                          Nose: septum midline, turbinates swollen, with erythema and clear discharge             Mouth/throat: MMM, tongue normal, mild pharyngeal erythema                           Neck: supple, no adenopathy, no thyromegaly, nontender                          Heart: RRR                         Lungs: + ins and exp wheezes, with scattered rhonchi      Assessment: Lower respiratory infection - Plan: amoxicillin-clavulanate (AUGMENTIN) 875-125 MG tablet, albuterol (PROVENTIL) (2.5 MG/3ML) 0.083% nebulizer solution 2.5 mg  Acute cough - Plan: POC COVID-19 BinaxNow, POCT respiratory syncytial virus, CBC with Differential/Platelet, DG Chest 2 View, CBC with Differential/Platelet, benzonatate (TESSALON) 200 MG capsule  Wheezing - Plan: CBC with Differential/Platelet, DG Chest 2 View, CBC with Differential/Platelet, benzonatate (TESSALON) 200 MG capsule, predniSONE (DELTASONE) 20 MG tablet, albuterol (VENTOLIN HFA) 108 (90 Base) MCG/ACT inhaler, albuterol (PROVENTIL) (2.5 MG/3ML) 0.083% nebulizer solution 2.5 mg  Low grade fever - Plan: CBC with Differential/Platelet, DG Chest 2 View, CBC with Differential/Platelet   Plan: Covid  and RSV tests are negative.  STAT Chest X ray and CBC ordered.  Albuterol breathing tx done in office. Lung sounds improved. Symptoms reportedly improved.  Suspicious for pneumonia. PET scan scheduled for next week for prostate cancer.  Short course of oral steroids, Amoxicillin and azithromycin ordered. Tessalon Perles prescribed.  Use Tussionex prn Suggested symptomatic OTC remedies.

## 2023-08-08 ENCOUNTER — Encounter (HOSPITAL_COMMUNITY): Admission: RE | Admit: 2023-08-08 | Payer: Medicare Other | Source: Ambulatory Visit

## 2023-08-08 ENCOUNTER — Encounter (HOSPITAL_COMMUNITY)
Admission: RE | Admit: 2023-08-08 | Discharge: 2023-08-08 | Disposition: A | Discharge status: Home / Self Care | Payer: Medicare Other | Source: Ambulatory Visit | Attending: Urology | Admitting: Urology

## 2023-08-08 DIAGNOSIS — C61 Malignant neoplasm of prostate: Secondary | ICD-10-CM | POA: Insufficient documentation

## 2023-08-08 MED ORDER — FLOTUFOLASTAT F 18 GALLIUM 296-5846 MBQ/ML IV SOLN
8.0000 | Freq: Once | INTRAVENOUS | Status: AC
  Administered 2023-08-08: 8.18 via INTRAVENOUS

## 2023-08-20 ENCOUNTER — Ambulatory Visit (INDEPENDENT_AMBULATORY_CARE_PROVIDER_SITE_OTHER): Payer: Medicare Other

## 2023-08-20 ENCOUNTER — Ambulatory Visit
Admission: RE | Admit: 2023-08-20 | Discharge: 2023-08-20 | Disposition: A | Discharge status: Home / Self Care | Payer: Medicare Other | Source: Ambulatory Visit | Attending: Internal Medicine | Admitting: Internal Medicine

## 2023-08-20 VITALS — BP 117/73 | HR 77 | Temp 98.5°F | Resp 18

## 2023-08-20 DIAGNOSIS — R052 Subacute cough: Secondary | ICD-10-CM

## 2023-08-20 DIAGNOSIS — R053 Chronic cough: Secondary | ICD-10-CM | POA: Diagnosis not present

## 2023-08-20 DIAGNOSIS — J189 Pneumonia, unspecified organism: Secondary | ICD-10-CM

## 2023-08-20 MED ORDER — HYDROCOD POLI-CHLORPHE POLI ER 10-8 MG/5ML PO SUER: 5.0000 mL | Freq: Two times a day (BID) | ORAL | 0 refills | Status: DC | PRN

## 2023-08-20 MED ORDER — PREDNISONE 20 MG PO TABS: 40.0000 mg | ORAL_TABLET | Freq: Every day | ORAL | 0 refills | Status: AC

## 2023-08-20 NOTE — ED Triage Notes (Signed)
Patient states that he did developed a cough couple of weeks ago.  Seen his PCP on 08/04/2023 had an xray, negative, given meds.  Patient had a PET Scan on 08/08/2023, showed some pneumonia.  Patient just returned from [place], now his cough has returned.

## 2023-08-20 NOTE — ED Provider Notes (Signed)
[facility]    CSN: [id number] Arrival date & time: 08/20/23  1310      History   Chief Complaint Chief Complaint  Patient presents with   Cough    HPI Jason Compton is a 87 y.o. male dents for evaluation of cough.  Patient was seen by his PCP on 12/12 for cough with suspected pneumonia.  While chest x-ray was negative he was started on Augmentin and azithromycin.  He subsequently had a PET scan for prostate cancer on 12/16 with incidental finding of pneumonia in his left lower lobe.  He was advised to continue the antibiotics as prescribed.  He states he is completed these and he felt his cough was improving until yesterday he felt like it worsened.  He did recently returned from [place] for the holidays.  Denies any fevers or shortness of breath.  No orthopnea or hemoptysis.  Denies asthma or smoking history.  He has been taking Tussidex cough medicine for symptoms.  No other concerns at this time.   Cough   Past Medical History:  Diagnosis Date   Aortic atherosclerosis (HCC)    CAD (coronary artery disease)    Daytime sleepiness    Exogenous obesity    GERD (gastroesophageal reflux disease)    History of hiatal hernia    Hyperlipidemia    Hypertension    OSA on CPAP    Prostate cancer (HCC)    PVC's (premature ventricular contractions)    Skin cancer     Patient Active Problem List   Diagnosis Date Noted   History of skin cancer 06/14/2023   Rash and nonspecific skin eruption 01/26/2021   Lipoma 11/18/2020   Malignant neoplasm of prostate (HCC) 06/29/2020   Inguinal hernia 05/06/2020   Cough 10/10/2018   Allergic rhinitis 07/25/2018   OSA on CPAP 05/08/2018   History of adenomatous polyp of colon 02/15/2018   Internal hemorrhoids 02/15/2018   Cervical spondylosis without myelopathy 10/13/2017   Aortic atherosclerosis (HCC) 04/28/2017   Coronary artery disease 04/28/2017   Prediabetes 11/02/2016   GERD (gastroesophageal reflux disease)  10/29/2016   Diverticulosis 07/30/2013   Hard of hearing 07/30/2013   Hypertension 03/08/2011   Hypercholesterolemia 03/08/2011    Past Surgical History:  Procedure Laterality Date   CARDIOVASCULAR STRESS TEST  04/18/2009   NORMAL   CATARACT EXTRACTION         Home Medications    Prior to Admission medications   Medication Sig Start Date End Date Taking? Authorizing Provider  albuterol (VENTOLIN HFA) 108 (90 Base) MCG/ACT inhaler Inhale 2 puffs into the lungs every 6 (six) hours as needed for wheezing or shortness of breath. 08/04/23  Yes Henson, Vickie L, NP-C  amLODipine (NORVASC) 5 MG tablet TAKE 1 TABLET BY MOUTH DAILY 04/26/23  Yes Chilton Si, MD  chlorpheniramine-HYDROcodone (TUSSIONEX) 10-8 MG/5ML Take 5 mLs by mouth every 12 (twelve) hours as needed for cough. 08/20/23  Yes Radford Pax, NP  cholecalciferol (VITAMIN D) 1000 UNITS tablet Take 1,000 Units by mouth daily.   Yes [provider]  cycloSPORINE (RESTASIS) 0.05 % ophthalmic emulsion Place 1 drop into both eyes 2 (two) times daily.   Yes [provider]  Evolocumab (REPATHA SURECLICK) 140 MG/ML SOAJ ADMINISTER 1 ML UNDER THE SKIN EVERY 14 DAYS 04/29/23  Yes Chilton Si, MD  ezetimibe (ZETIA) 10 MG tablet TAKE 1 TABLET BY MOUTH DAILY 04/26/23  Yes Chilton Si, MD  famotidine (PEPCID) 20 MG tablet Take 20 mg by  mouth daily.   Yes [provider]  hydrochlorothiazide (HYDRODIURIL) 25 MG tablet TAKE 1 TABLET BY MOUTH DAILY 07/05/23  Yes Chilton Si, MD  Multiple Vitamin (MULTIVITAMIN) tablet Take 1 tablet by mouth daily.   Yes [provider]  OVER THE COUNTER MEDICATION hydroEyes 2 tablets twice a day   Yes [provider]  predniSONE (DELTASONE) 20 MG tablet Take 2 tablets (40 mg total) by mouth daily with breakfast for 5 days. 08/20/23 08/25/23 Yes Radford Pax, NP  vitamin C (ASCORBIC ACID) 500 MG tablet Take 500 mg by mouth daily.   Yes [provider]  amoxicillin-clavulanate (AUGMENTIN) 875-125 MG tablet Take 1 tablet by mouth 2 (two) times daily. 08/04/23   Henson, Vickie L, NP-C  benzonatate (TESSALON) 200 MG capsule Take 1 capsule (200 mg total) by mouth 2 (two) times daily as needed for cough. 08/04/23   Avanell Shackleton, NP-C    Family History Family History  Problem Relation Age of Onset   Osteoporosis Mother    Cancer Father        skin   Skin cancer Father    Hypertension Brother    Skin cancer Brother    Breast cancer Neg Hx    Colon cancer Neg Hx    Pancreatic cancer Neg Hx    Prostate cancer Neg Hx     Social History Social History   Tobacco Use   Smoking status: Never   Smokeless tobacco: Never  Vaping Use   Vaping status: Never Used  Substance Use Topics   Alcohol use: Yes    Alcohol/week: 0.0 standard drinks of alcohol    Comment: wine   Drug use: No     Allergies   Altace [ramipril], Lipitor [atorvastatin], and Rosuvastatin   Review of Systems Review of Systems  Respiratory:  Positive for cough.      Physical Exam Triage Vital Signs ED Triage Vitals  Encounter Vitals Group     BP 08/20/23 1426 117/73     Systolic BP Percentile --      Diastolic BP Percentile --      Pulse Rate 08/20/23 1426 77     Resp 08/20/23 1426 18     Temp 08/20/23 1426 98.5 F (36.9 C)     Temp Source 08/20/23 1426 Oral     SpO2 08/20/23 1426 93 %     Weight --      Height --      Head Circumference --      Peak Flow --      Pain Score 08/20/23 1427 0     Pain Loc --      Pain Education --      Exclude from Growth Chart --    No data found.  Updated Vital Signs BP 117/73 (BP Location: Left Arm)   Pulse 77   Temp 98.5 F (36.9 C) (Oral)   Resp 18   SpO2 93%   Visual Acuity Right Eye Distance:   Left Eye Distance:   Bilateral Distance:    Right Eye Near:   Left Eye Near:    Bilateral Near:     Physical Exam Vitals and nursing note reviewed.  Constitutional:      General: He  is not in acute distress.    Appearance: Normal appearance. He is not ill-appearing or toxic-appearing.  HENT:     Head: Normocephalic and atraumatic.     Right Ear: Tympanic membrane and ear canal normal.  Left Ear: Tympanic membrane and ear canal normal.     Nose: No congestion.     Mouth/Throat:     Mouth: Mucous membranes are moist.     Pharynx: No posterior oropharyngeal erythema.  Eyes:     Pupils: Pupils are equal, round, and reactive to light.  Cardiovascular:     Rate and Rhythm: Normal rate and regular rhythm.     Heart sounds: Normal heart sounds.  Pulmonary:     Effort: Pulmonary effort is normal. No respiratory distress.     Breath sounds: Normal breath sounds. No stridor. No wheezing, rhonchi or rales.  Musculoskeletal:     Cervical back: Normal range of motion and neck supple.  Lymphadenopathy:     Cervical: No cervical adenopathy.  Skin:    General: Skin is warm and dry.  Neurological:     General: No focal deficit present.     Mental Status: He is alert and oriented to person, place, and time.  Psychiatric:        Mood and Affect: Mood normal.        Behavior: Behavior normal.      UC Treatments / Results  Labs (all labs ordered are listed, but only abnormal results are displayed) Labs Reviewed - No data to display  EKG   Radiology No results found.  Procedures Procedures (including critical care time)  Medications Ordered in UC Medications - No data to display  Initial Impression / Assessment and Plan / UC Course  I have reviewed the triage vital signs and the nursing notes.  Pertinent labs & imaging results that were available during my care of the patient were reviewed by me and considered in my medical decision making (see chart for details).     Reviewed exam and symptoms with patient.  No red flags.  He is well-appearing and in no acute distress.  Lung exam unremarkable.  Repeated chest x-ray, wet read without obvious consolidation.   Will contact for any positive results based on radiology overread.  Patient is already completed treatment for pneumonia with Augmentin and Zithromax.  Will do 5 days of prednisone and patient requested refill of Tussidex which was provided.  Advised he follow-up with his PCP in 2 days for recheck.  Also discussed that cough can last up to 6 weeks after pneumonia treatment and that he should have a repeat chest x-ray in 4 weeks to confirm resolution.  Strict ER precautions were reviewed and patient verbalized understanding. Final Clinical Impressions(s) / UC Diagnoses   Final diagnoses:  Chronic cough  Subacute cough  Pneumonia due to infectious organism, unspecified laterality, unspecified part of lung     Discharge Instructions      The clinic will contact you with results of the chest x-ray done today if it is positive.  Please start prednisone daily for 5 days.  I refilled your cough medicine as well.  Please follow-up with your PCP next week for recheck.  Please go to the ER if you develop any worsening symptoms.  I hope you feel better soon!     ED Prescriptions     Medication Sig Dispense Auth. Provider   predniSONE (DELTASONE) 20 MG tablet Take 2 tablets (40 mg total) by mouth daily with breakfast for 5 days. 10 tablet Radford Pax, NP   chlorpheniramine-HYDROcodone (TUSSIONEX) 10-8 MG/5ML Take 5 mLs by mouth every 12 (twelve) hours as needed for cough. 70 mL Radford Pax, NP  I have reviewed the PDMP during this encounter.   Radford Pax, NP 08/20/23 1540

## 2023-08-20 NOTE — Discharge Instructions (Signed)
The clinic will contact you with results of the chest x-ray done today if it is positive.  Please start prednisone daily for 5 days.  I refilled your cough medicine as well.  Please follow-up with your PCP next week for recheck.  Please go to the ER if you develop any worsening symptoms.  I hope you feel better soon!

## 2023-08-30 ENCOUNTER — Encounter: Payer: Self-pay | Admitting: Internal Medicine

## 2023-08-30 NOTE — Progress Notes (Signed)
 Subjective:    Patient ID: Jason Compton, male    DOB: [birth year], 88 y.o.   MRN: [id number]     HPI Jason Compton is here for follow up from the hospital.   Seen in the office 12/12 - LRTI - concern for PNA.   CXR w/o infection.  Treated with augmentin , zpak, prednisone , albuterol , tessalon .    Had PET scan 12/16 - showed PNA in LLL.  Cough was improving until 12/27 - it worsened.  Seen in ED 12/28.  No fever or SOB.  Had repeat cxr - PNA persistent in LLL.  No additional abx given.  Prednisone  x 5 days, tussionex cough syrup given.    Symptoms overall improved.  No fever.    Initial phlegm 1/2 dollar 2-4 times an hour - initially yellow-green.  As of yesterday - clear phlegm - today a little yellow-green mucus.   Significant decreased phlegm.    Taking cough medication once at night and it helps.  Medications and allergies reviewed with patient and updated if appropriate.  Current Outpatient Medications on File Prior to Visit  Medication Sig Dispense Refill   albuterol  (VENTOLIN  HFA) 108 (90 Base) MCG/ACT inhaler Inhale 2 puffs into the lungs every 6 (six) hours as needed for wheezing or shortness of breath. 8 g 0   amLODipine  (NORVASC ) 5 MG tablet TAKE 1 TABLET BY MOUTH DAILY 100 tablet 2   chlorpheniramine-HYDROcodone  (TUSSIONEX) 10-8 MG/5ML Take 5 mLs by mouth every 12 (twelve) hours as needed for cough. 70 mL 0   cholecalciferol (VITAMIN D ) 1000 UNITS tablet Take 1,000 Units by mouth daily.     cycloSPORINE (RESTASIS) 0.05 % ophthalmic emulsion Place 1 drop into both eyes 2 (two) times daily.     Evolocumab  (REPATHA  SURECLICK) 140 MG/ML SOAJ ADMINISTER 1 ML UNDER THE SKIN EVERY 14 DAYS 2 mL 6   ezetimibe  (ZETIA ) 10 MG tablet TAKE 1 TABLET BY MOUTH DAILY 100 tablet 2   famotidine (PEPCID) 20 MG tablet Take 20 mg by mouth daily.     hydrochlorothiazide  (HYDRODIURIL ) 25 MG tablet TAKE 1 TABLET BY MOUTH DAILY 90 tablet 3   Multiple Vitamin (MULTIVITAMIN) tablet Take 1 tablet  by mouth daily.     OVER THE COUNTER MEDICATION hydroEyes 2 tablets twice a day     vitamin C (ASCORBIC ACID) 500 MG tablet Take 500 mg by mouth daily.     No current facility-administered medications on file prior to visit.     Review of Systems  Constitutional:  Positive for appetite change (decreased slightly) and fatigue. Negative for fever.  HENT:  Positive for postnasal drip (tickle). Negative for congestion, sinus pain and sore throat.   Respiratory:  Positive for cough (improved mucus - when lays on back gets PND and coughs). Negative for chest tightness, shortness of breath and wheezing.   Cardiovascular:  Negative for chest pain.       Objective:   Vitals:   08/31/23 0944  BP: 116/78  Pulse: 94  Temp: 98.5 F (36.9 C)  SpO2: 96%   BP Readings from Last 3 Encounters:  08/31/23 116/78  08/20/23 117/73  08/04/23 122/74   Wt Readings from Last 3 Encounters:  08/31/23 201 lb (91.2 kg)  08/04/23 207 lb (93.9 kg)  06/14/23 207 lb (93.9 kg)   Body mass index is 32.44 kg/m.    Physical Exam Constitutional:      General: He is not in acute distress.    Appearance:  Normal appearance. He is not ill-appearing.  HENT:     Head: Normocephalic.     Mouth/Throat:     Mouth: Mucous membranes are moist.     Pharynx: No oropharyngeal exudate or posterior oropharyngeal erythema.  Eyes:     Conjunctiva/sclera: Conjunctivae normal.  Cardiovascular:     Rate and Rhythm: Normal rate and regular rhythm.  Pulmonary:     Effort: Pulmonary effort is normal. No respiratory distress.     Breath sounds: Normal breath sounds. No wheezing or rales.  Musculoskeletal:     Cervical back: Neck supple. No tenderness.  Lymphadenopathy:     Cervical: No cervical adenopathy.  Skin:    General: Skin is warm and dry.     Findings: No rash.  Neurological:     Mental Status: He is alert.        Lab Results  Component Value Date   WBC 10.9 (H) 08/04/2023   HGB 16.1 08/04/2023    HCT 46.9 08/04/2023   PLT 205.0 08/04/2023   GLUCOSE 103 (H) 06/14/2023   CHOL 158 06/14/2023   TRIG 75.0 06/14/2023   HDL 66.20 06/14/2023   LDLDIRECT 189.7 12/10/2011   LDLCALC 77 06/14/2023   ALT 20 06/14/2023   AST 18 06/14/2023   NA 133 (L) 06/14/2023   K 4.3 06/14/2023   CL 96 06/14/2023   CREATININE 0.82 06/14/2023   BUN 10 06/14/2023   CO2 31 06/14/2023   TSH 0.98 12/09/2022   PSA 6.98 (H) 10/03/2015   HGBA1C 5.8 06/14/2023   DG Chest 2 View CLINICAL DATA:  Recent pneumonia  EXAM: CHEST - 2 VIEW  COMPARISON:  08/04/2023, 08/08/2023  FINDINGS: Frontal and lateral views of the chest demonstrate a stable cardiac silhouette. Scattered airspace disease is again seen within the left lower lobe, consistent with bronchopneumonia. No effusion or pneumothorax. No acute bony abnormalities.  IMPRESSION: 1. Persistent patchy left lower lobe bronchopneumonia, without appreciable change since recent PET scan. Please note that it may take 3-4 weeks for complete radiographic resolution of pneumonia after appropriate medical management.  Electronically Signed   By: Ozell Daring M.D.   On: 08/20/2023 16:11    Assessment & Plan:    See Problem List for Assessment and Plan of chronic medical problems.

## 2023-08-30 NOTE — Patient Instructions (Addendum)
        Medications changes include :   None

## 2023-08-31 ENCOUNTER — Ambulatory Visit: Payer: Medicare Other | Admitting: Internal Medicine

## 2023-08-31 ENCOUNTER — Ambulatory Visit: Payer: Self-pay | Admitting: Internal Medicine

## 2023-08-31 VITALS — BP 116/78 | HR 94 | Temp 98.5°F | Ht 66.0 in | Wt 201.0 lb

## 2023-08-31 DIAGNOSIS — J189 Pneumonia, unspecified organism: Secondary | ICD-10-CM | POA: Diagnosis not present

## 2023-08-31 NOTE — Assessment & Plan Note (Addendum)
 Acute Treated with Augmentin , Z-Pak with improvement Also given prednisone  x 2 rounds, albuterol , Tessalon  Perles and Tussionex cough syrup Symptoms improved and continue to improve Discussed he may have a cough for another week or 2, but will continue to improve Discussed appetite and fatigue will also slowly improve Call if symptoms worsen or do not continue to improve Discussed repeat chest x-ray-I think we can defer the x-ray to ensure resolution of the pneumonia since he just had a PET scan there is no evidence of any underlying mass Discussed that if his symptoms do not completely resolve then we will do a chest x-ray He will let me know if he needs a refill of the Tussionex cough syrup

## 2023-09-01 ENCOUNTER — Inpatient Hospital Stay: Payer: Medicare Other | Admitting: Internal Medicine

## 2023-09-01 DIAGNOSIS — Z85828 Personal history of other malignant neoplasm of skin: Secondary | ICD-10-CM | POA: Diagnosis not present

## 2023-09-01 DIAGNOSIS — L300 Nummular dermatitis: Secondary | ICD-10-CM | POA: Diagnosis not present

## 2023-09-01 DIAGNOSIS — D1801 Hemangioma of skin and subcutaneous tissue: Secondary | ICD-10-CM | POA: Diagnosis not present

## 2023-09-01 DIAGNOSIS — L570 Actinic keratosis: Secondary | ICD-10-CM | POA: Diagnosis not present

## 2023-09-01 DIAGNOSIS — L821 Other seborrheic keratosis: Secondary | ICD-10-CM | POA: Diagnosis not present

## 2023-09-01 DIAGNOSIS — L814 Other melanin hyperpigmentation: Secondary | ICD-10-CM | POA: Diagnosis not present

## 2023-10-19 DIAGNOSIS — H353211 Exudative age-related macular degeneration, right eye, with active choroidal neovascularization: Secondary | ICD-10-CM | POA: Diagnosis not present

## 2023-10-19 DIAGNOSIS — H02834 Dermatochalasis of left upper eyelid: Secondary | ICD-10-CM | POA: Diagnosis not present

## 2023-10-19 DIAGNOSIS — H35033 Hypertensive retinopathy, bilateral: Secondary | ICD-10-CM | POA: Diagnosis not present

## 2023-10-19 DIAGNOSIS — H04123 Dry eye syndrome of bilateral lacrimal glands: Secondary | ICD-10-CM | POA: Diagnosis not present

## 2023-10-25 DIAGNOSIS — H43813 Vitreous degeneration, bilateral: Secondary | ICD-10-CM | POA: Diagnosis not present

## 2023-10-25 DIAGNOSIS — H353122 Nonexudative age-related macular degeneration, left eye, intermediate dry stage: Secondary | ICD-10-CM | POA: Diagnosis not present

## 2023-10-25 DIAGNOSIS — H353211 Exudative age-related macular degeneration, right eye, with active choroidal neovascularization: Secondary | ICD-10-CM | POA: Diagnosis not present

## 2023-10-26 ENCOUNTER — Encounter: Payer: Self-pay | Admitting: Internal Medicine

## 2023-10-26 ENCOUNTER — Ambulatory Visit: Payer: Self-pay | Admitting: Internal Medicine

## 2023-10-26 NOTE — Telephone Encounter (Signed)
 Chief Complaint: Upper abdominal pain Symptoms: pain 5/10, fever 99, chills Frequency: onset this morning at 0430 Pertinent Negatives: Patient denies nausea, vomiting, other symptoms Disposition: [] ED /[] Urgent Care (no appt availability in office) / [x] Appointment(In office/virtual)/ []  [facility]/ [] Home Care/ [] Refused Recommended Disposition /[] [facility]/ []  Follow-up with PCP Additional Notes: Patient's wife called and says he woke up at 0430 with upper abdominal pain, non-radiating, chills, fever 99, no other symptoms. She says she only wants him to see Dr. Lawerance Bach after advising no availability. I placed her on hold called [facility] and spoke to [place]. She says Dr. Lawerance Bach doesn't have any openings and no other provider until Friday. Patient's wife advised, she agreed to be scheduled with any provider on Friday.

## 2023-10-26 NOTE — Telephone Encounter (Signed)
 Copied from CRM [id number]. Topic: Clinical - Red Word Triage >> Oct 26, 2023  4:45 PM Denese Killings wrote: Red Word that prompted transfer to Nurse Triage: Patient isn't feeling well. He has chills, fever of 99, and expeeiwncing pain in the upper middle abdomen, very fatigue. Reason for Disposition  [1] MODERATE pain (e.g., interferes with normal activities) AND [2] comes and goes (cramps) AND [3] present > 24 hours  (Exception: Pain with Vomiting or Diarrhea - see that Guideline.)  Answer Assessment - Initial Assessment Questions 1. LOCATION: "Where does it hurt?"      Upper in the middle 2. RADIATION: "Does the pain shoot anywhere else?" (e.g., chest, back)     No 3. ONSET: "When did the pain begin?" (e.g., minutes, hours or days ago)      Today around 0430 4. SUDDEN: "Gradual or sudden onset?"     Sudden 5. PATTERN "Does the pain come and go, or is it constant?"    - If it comes and goes: "How long does it last?" "Do you have pain now?"     (Note: Comes and goes means the pain is intermittent. It goes away completely between bouts.)    - If constant: "Is it getting better, staying the same, or getting worse?"      (Note: Constant means the pain never goes away completely; most serious pain is constant and gets worse.)      Constant 6. SEVERITY: "How bad is the pain?"  (e.g., Scale 1-10; mild, moderate, or severe)    - MILD (1-3): Doesn't interfere with normal activities, abdomen soft and not tender to touch..     - MODERATE (4-7): Interferes with normal activities or awakens from sleep, abdomen tender to touch.     - SEVERE (8-10): Excruciating pain, doubled over, unable to do any normal activities.       5 7. RECURRENT SYMPTOM: "Have you ever had this type of stomach pain before?" If Yes, ask: "When was the last time?" and "What happened that time?"      No 8. AGGRAVATING FACTORS: "Does anything seem to cause this pain?" (e.g., foods, stress, alcohol)     Nothing made it worse 9. CARDIAC  SYMPTOMS: "Do you have any of the following symptoms: chest pain, difficulty breathing, sweating, nausea?"     No 10. OTHER SYMPTOMS: "Do you have any other symptoms?" (e.g., back pain, diarrhea, fever, urination pain, vomiting)       Fever 99, chills  Protocols used: Abdominal Pain - Upper-A-AH

## 2023-10-27 ENCOUNTER — Inpatient Hospital Stay (HOSPITAL_COMMUNITY)
Admission: EM | Admit: 2023-10-27 | Discharge: 2023-10-31 | DRG: 398 | Disposition: A | Discharge status: Home / Self Care | Attending: Surgery | Admitting: Surgery

## 2023-10-27 ENCOUNTER — Emergency Department (HOSPITAL_COMMUNITY): Admitting: Anesthesiology

## 2023-10-27 ENCOUNTER — Encounter (HOSPITAL_COMMUNITY): Admission: EM | Disposition: A | Discharge status: Home / Self Care | Payer: Self-pay | Source: Home / Self Care

## 2023-10-27 ENCOUNTER — Encounter (HOSPITAL_BASED_OUTPATIENT_CLINIC_OR_DEPARTMENT_OTHER): Payer: Self-pay

## 2023-10-27 ENCOUNTER — Other Ambulatory Visit: Payer: Self-pay

## 2023-10-27 ENCOUNTER — Emergency Department (HOSPITAL_COMMUNITY)

## 2023-10-27 ENCOUNTER — Ambulatory Visit (HOSPITAL_BASED_OUTPATIENT_CLINIC_OR_DEPARTMENT_OTHER): Admitting: Cardiovascular Disease

## 2023-10-27 ENCOUNTER — Encounter (HOSPITAL_COMMUNITY): Payer: Self-pay

## 2023-10-27 DIAGNOSIS — E871 Hypo-osmolality and hyponatremia: Secondary | ICD-10-CM | POA: Diagnosis not present

## 2023-10-27 DIAGNOSIS — E785 Hyperlipidemia, unspecified: Secondary | ICD-10-CM | POA: Diagnosis not present

## 2023-10-27 DIAGNOSIS — E7800 Pure hypercholesterolemia, unspecified: Secondary | ICD-10-CM

## 2023-10-27 DIAGNOSIS — Z8262 Family history of osteoporosis: Secondary | ICD-10-CM

## 2023-10-27 DIAGNOSIS — Z808 Family history of malignant neoplasm of other organs or systems: Secondary | ICD-10-CM | POA: Diagnosis not present

## 2023-10-27 DIAGNOSIS — E876 Hypokalemia: Secondary | ICD-10-CM | POA: Diagnosis not present

## 2023-10-27 DIAGNOSIS — J8410 Pulmonary fibrosis, unspecified: Secondary | ICD-10-CM | POA: Diagnosis not present

## 2023-10-27 DIAGNOSIS — K3580 Unspecified acute appendicitis: Secondary | ICD-10-CM | POA: Diagnosis not present

## 2023-10-27 DIAGNOSIS — I10 Essential (primary) hypertension: Secondary | ICD-10-CM

## 2023-10-27 DIAGNOSIS — R109 Unspecified abdominal pain: Secondary | ICD-10-CM | POA: Diagnosis not present

## 2023-10-27 DIAGNOSIS — I700 Atherosclerosis of aorta: Secondary | ICD-10-CM | POA: Diagnosis not present

## 2023-10-27 DIAGNOSIS — K3589 Other acute appendicitis without perforation or gangrene: Secondary | ICD-10-CM | POA: Diagnosis not present

## 2023-10-27 DIAGNOSIS — R7881 Bacteremia: Principal | ICD-10-CM | POA: Diagnosis present

## 2023-10-27 DIAGNOSIS — Z85828 Personal history of other malignant neoplasm of skin: Secondary | ICD-10-CM | POA: Diagnosis not present

## 2023-10-27 DIAGNOSIS — E872 Acidosis, unspecified: Secondary | ICD-10-CM | POA: Diagnosis present

## 2023-10-27 DIAGNOSIS — I499 Cardiac arrhythmia, unspecified: Secondary | ICD-10-CM | POA: Diagnosis not present

## 2023-10-27 DIAGNOSIS — Z1152 Encounter for screening for COVID-19: Secondary | ICD-10-CM

## 2023-10-27 DIAGNOSIS — K35891 Other acute appendicitis without perforation, with gangrene: Principal | ICD-10-CM | POA: Diagnosis present

## 2023-10-27 DIAGNOSIS — R079 Chest pain, unspecified: Secondary | ICD-10-CM | POA: Diagnosis not present

## 2023-10-27 DIAGNOSIS — I2510 Atherosclerotic heart disease of native coronary artery without angina pectoris: Secondary | ICD-10-CM | POA: Diagnosis present

## 2023-10-27 DIAGNOSIS — Z860100 Personal history of colon polyps, unspecified: Secondary | ICD-10-CM | POA: Diagnosis not present

## 2023-10-27 DIAGNOSIS — R509 Fever, unspecified: Secondary | ICD-10-CM | POA: Diagnosis not present

## 2023-10-27 DIAGNOSIS — R000 Tachycardia, unspecified: Secondary | ICD-10-CM | POA: Diagnosis not present

## 2023-10-27 DIAGNOSIS — Z79899 Other long term (current) drug therapy: Secondary | ICD-10-CM

## 2023-10-27 DIAGNOSIS — G4733 Obstructive sleep apnea (adult) (pediatric): Secondary | ICD-10-CM | POA: Diagnosis not present

## 2023-10-27 DIAGNOSIS — K353 Acute appendicitis with localized peritonitis, without perforation or gangrene: Secondary | ICD-10-CM | POA: Diagnosis not present

## 2023-10-27 DIAGNOSIS — Z8546 Personal history of malignant neoplasm of prostate: Secondary | ICD-10-CM

## 2023-10-27 DIAGNOSIS — I493 Ventricular premature depolarization: Secondary | ICD-10-CM | POA: Diagnosis not present

## 2023-10-27 DIAGNOSIS — B9620 Unspecified Escherichia coli [E. coli] as the cause of diseases classified elsewhere: Secondary | ICD-10-CM | POA: Diagnosis present

## 2023-10-27 DIAGNOSIS — F1090 Alcohol use, unspecified, uncomplicated: Secondary | ICD-10-CM | POA: Diagnosis present

## 2023-10-27 DIAGNOSIS — Z0389 Encounter for observation for other suspected diseases and conditions ruled out: Secondary | ICD-10-CM | POA: Diagnosis not present

## 2023-10-27 DIAGNOSIS — Z8249 Family history of ischemic heart disease and other diseases of the circulatory system: Secondary | ICD-10-CM | POA: Diagnosis not present

## 2023-10-27 DIAGNOSIS — Z888 Allergy status to other drugs, medicaments and biological substances status: Secondary | ICD-10-CM | POA: Diagnosis not present

## 2023-10-27 DIAGNOSIS — B001 Herpesviral vesicular dermatitis: Secondary | ICD-10-CM | POA: Diagnosis present

## 2023-10-27 DIAGNOSIS — K219 Gastro-esophageal reflux disease without esophagitis: Secondary | ICD-10-CM | POA: Diagnosis not present

## 2023-10-27 LAB — COMPREHENSIVE METABOLIC PANEL
ALT: 15 U/L (ref 0–44)
ALT: 14 U/L (ref 0–44)
AST: 25 U/L (ref 15–41)
AST: 21 U/L (ref 15–41)
Albumin: 2.9 g/dL — ABNORMAL LOW (ref 3.5–5.0)
Albumin: 2.7 g/dL — ABNORMAL LOW (ref 3.5–5.0)
Alkaline Phosphatase: 48 U/L (ref 38–126)
Alkaline Phosphatase: 43 U/L (ref 38–126)
Anion gap: 16 — ABNORMAL HIGH (ref 5–15)
Anion gap: 9 (ref 5–15)
BUN: 16 mg/dL (ref 8–23)
BUN: 15 mg/dL (ref 8–23)
CO2: 19 mmol/L — ABNORMAL LOW (ref 22–32)
CO2: 22 mmol/L (ref 22–32)
Calcium: 8.1 mg/dL — ABNORMAL LOW (ref 8.9–10.3)
Calcium: 7.9 mg/dL — ABNORMAL LOW (ref 8.9–10.3)
Chloride: 92 mmol/L — ABNORMAL LOW (ref 98–111)
Chloride: 96 mmol/L — ABNORMAL LOW (ref 98–111)
Creatinine, Ser: 1.08 mg/dL (ref 0.61–1.24)
Creatinine, Ser: 0.87 mg/dL (ref 0.61–1.24)
GFR, Estimated: >60 mL/min (ref >60)
GFR, Estimated: >60 mL/min (ref >60)
Glucose, Bld: 139 mg/dL — ABNORMAL HIGH (ref 70–99)
Glucose, Bld: 131 mg/dL — ABNORMAL HIGH (ref 70–99)
Potassium: 3.1 mmol/L — ABNORMAL LOW (ref 3.5–5.1)
Potassium: 3.1 mmol/L — ABNORMAL LOW (ref 3.5–5.1)
Sodium: 127 mmol/L — ABNORMAL LOW (ref 135–145)
Sodium: 127 mmol/L — ABNORMAL LOW (ref 135–145)
Total Bilirubin: 2.4 mg/dL — ABNORMAL HIGH (ref 0.0–1.2)
Total Bilirubin: 1.9 mg/dL — ABNORMAL HIGH (ref 0.0–1.2)
Total Protein: 5.6 g/dL — ABNORMAL LOW (ref 6.5–8.1)
Total Protein: 5.2 g/dL — ABNORMAL LOW (ref 6.5–8.1)

## 2023-10-27 LAB — BLOOD CULTURE ID PANEL (REFLEXED) - BCID2

## 2023-10-27 LAB — RESP PANEL BY RT-PCR (RSV, FLU A&B, COVID)  RVPGX2
Influenza A by PCR: NEGATIVE
Influenza B by PCR: NEGATIVE
Resp Syncytial Virus by PCR: NEGATIVE
SARS Coronavirus 2 by RT PCR: NEGATIVE

## 2023-10-27 LAB — I-STAT CG4 LACTIC ACID, ED
Lactic Acid, Venous: 3.8 mmol/L (ref 0.5–1.9)
Lactic Acid, Venous: 1.7 mmol/L (ref 0.5–1.9)

## 2023-10-27 LAB — PHOSPHORUS: Phosphorus: 2.2 mg/dL — ABNORMAL LOW (ref 2.5–4.6)

## 2023-10-27 LAB — CBC WITH DIFFERENTIAL/PLATELET
Abs Immature Granulocytes: 0.02 10*3/uL (ref 0.00–0.07)
Basophils Absolute: 0 10*3/uL (ref 0.0–0.1)
Basophils Relative: 0 %
Eosinophils Absolute: 0 10*3/uL (ref 0.0–0.5)
Eosinophils Relative: 0 %
HCT: 41.9 % (ref 39.0–52.0)
Hemoglobin: 15.1 g/dL (ref 13.0–17.0)
Immature Granulocytes: 0 %
Lymphocytes Relative: 4 %
Lymphs Abs: 0.2 10*3/uL — ABNORMAL LOW (ref 0.7–4.0)
MCH: 33.9 pg (ref 26.0–34.0)
MCHC: 36 g/dL (ref 30.0–36.0)
MCV: 94.2 fL (ref 80.0–100.0)
Monocytes Absolute: 0 10*3/uL — ABNORMAL LOW (ref 0.1–1.0)
Monocytes Relative: 1 %
Neutro Abs: 4.9 10*3/uL (ref 1.7–7.7)
Neutrophils Relative %: 95 %
Platelets: 166 10*3/uL (ref 150–400)
RBC: 4.45 MIL/uL (ref 4.22–5.81)
RDW: 13.4 % (ref 11.5–15.5)
WBC: 5.2 10*3/uL (ref 4.0–10.5)
nRBC: 0 % (ref 0.0–0.2)

## 2023-10-27 LAB — PROTIME-INR
INR: 1.2 (ref 0.8–1.2)
Prothrombin Time: 15 s (ref 11.4–15.2)

## 2023-10-27 LAB — APTT: aPTT: 25 s (ref 24–36)

## 2023-10-27 LAB — MAGNESIUM: Magnesium: 1.6 mg/dL — ABNORMAL LOW (ref 1.7–2.4)

## 2023-10-27 SURGERY — APPENDECTOMY, LAPAROSCOPIC
Anesthesia: General

## 2023-10-27 MED ORDER — OXYCODONE HCL 5 MG PO TABS
2.5000 mg | ORAL_TABLET | ORAL | Status: DC | PRN
  Administered 2023-10-27: 5 mg via ORAL
  Filled 2023-10-27: qty 1

## 2023-10-27 MED ORDER — ADULT MULTIVITAMIN W/MINERALS CH
1.0000 | ORAL_TABLET | Freq: Every day | ORAL | Status: DC
  Administered 2023-10-28 – 2023-10-31 (×4): 1 via ORAL
  Filled 2023-10-27 (×4): qty 1

## 2023-10-27 MED ORDER — SIMETHICONE 80 MG PO CHEW: 40.0000 mg | CHEWABLE_TABLET | Freq: Four times a day (QID) | ORAL | Status: DC | PRN

## 2023-10-27 MED ORDER — SODIUM CHLORIDE 0.9 % IV SOLN
2.0000 g | INTRAVENOUS | Status: DC
  Administered 2023-10-28 – 2023-10-30 (×3): 2 g via INTRAVENOUS
  Filled 2023-10-27 (×3): qty 20

## 2023-10-27 MED ORDER — SODIUM CHLORIDE 0.9 % IR SOLN
Status: DC | PRN
  Administered 2023-10-27: 1000 mL

## 2023-10-27 MED ORDER — METOPROLOL TARTRATE 5 MG/5ML IV SOLN: 5.0000 mg | Freq: Four times a day (QID) | INTRAVENOUS | Status: DC | PRN

## 2023-10-27 MED ORDER — ROCURONIUM BROMIDE 10 MG/ML (PF) SYRINGE
PREFILLED_SYRINGE | INTRAVENOUS | Status: AC
  Filled 2023-10-27: qty 20

## 2023-10-27 MED ORDER — PHENYLEPHRINE 80 MCG/ML (10ML) SYRINGE FOR IV PUSH (FOR BLOOD PRESSURE SUPPORT)
PREFILLED_SYRINGE | INTRAVENOUS | Status: AC
  Filled 2023-10-27: qty 10

## 2023-10-27 MED ORDER — BUPIVACAINE-EPINEPHRINE 0.25% -1:200000 IJ SOLN
INTRAMUSCULAR | Status: DC | PRN
  Administered 2023-10-27: 5 mL

## 2023-10-27 MED ORDER — FENTANYL CITRATE (PF) 250 MCG/5ML IJ SOLN
INTRAMUSCULAR | Status: DC | PRN
Start: 2023-10-27 — End: 2023-10-27
  Administered 2023-10-27: 50 ug via INTRAVENOUS
  Administered 2023-10-27: 25 ug via INTRAVENOUS

## 2023-10-27 MED ORDER — LORAZEPAM 1 MG PO TABS: 1.0000 mg | ORAL_TABLET | ORAL | Status: AC | PRN

## 2023-10-27 MED ORDER — ONDANSETRON HCL 4 MG/2ML IJ SOLN: 4.0000 mg | Freq: Four times a day (QID) | INTRAMUSCULAR | Status: DC | PRN

## 2023-10-27 MED ORDER — AMLODIPINE BESYLATE 5 MG PO TABS
5.0000 mg | ORAL_TABLET | Freq: Every day | ORAL | Status: DC
  Administered 2023-10-27 – 2023-10-31 (×5): 5 mg via ORAL
  Filled 2023-10-27 (×5): qty 1

## 2023-10-27 MED ORDER — HYDRALAZINE HCL 20 MG/ML IJ SOLN: 10.0000 mg | INTRAMUSCULAR | Status: DC | PRN

## 2023-10-27 MED ORDER — FENTANYL CITRATE (PF) 250 MCG/5ML IJ SOLN
INTRAMUSCULAR | Status: AC
  Filled 2023-10-27: qty 5

## 2023-10-27 MED ORDER — CHLORHEXIDINE GLUCONATE 0.12 % MT SOLN: 15.0000 mL | Freq: Once | OROMUCOSAL | Status: AC

## 2023-10-27 MED ORDER — POLYETHYLENE GLYCOL 3350 17 G PO PACK: 17.0000 g | PACK | Freq: Every day | ORAL | Status: DC | PRN

## 2023-10-27 MED ORDER — AMISULPRIDE (ANTIEMETIC) 5 MG/2ML IV SOLN: 10.0000 mg | Freq: Once | INTRAVENOUS | Status: DC | PRN

## 2023-10-27 MED ORDER — PROPOFOL 10 MG/ML IV BOLUS
INTRAVENOUS | Status: DC | PRN
  Administered 2023-10-27: 60 mg via INTRAVENOUS
  Administered 2023-10-27: 70 mg via INTRAVENOUS

## 2023-10-27 MED ORDER — DEXAMETHASONE SODIUM PHOSPHATE 10 MG/ML IJ SOLN
INTRAMUSCULAR | Status: AC
  Filled 2023-10-27: qty 1

## 2023-10-27 MED ORDER — SODIUM CHLORIDE 0.9 % IV SOLN: INTRAVENOUS | Status: AC

## 2023-10-27 MED ORDER — HYDROCHLOROTHIAZIDE 25 MG PO TABS: 25.0000 mg | ORAL_TABLET | Freq: Every day | ORAL | Status: DC

## 2023-10-27 MED ORDER — THIAMINE MONONITRATE 100 MG PO TABS
100.0000 mg | ORAL_TABLET | Freq: Every day | ORAL | Status: DC
  Administered 2023-10-28 – 2023-10-31 (×4): 100 mg via ORAL
  Filled 2023-10-27 (×4): qty 1

## 2023-10-27 MED ORDER — ONDANSETRON HCL 4 MG/2ML IJ SOLN
INTRAMUSCULAR | Status: DC | PRN
  Administered 2023-10-27: 4 mg via INTRAVENOUS

## 2023-10-27 MED ORDER — ACETAMINOPHEN 500 MG PO TABS: 1000.0000 mg | ORAL_TABLET | Freq: Four times a day (QID) | ORAL | Status: DC | PRN

## 2023-10-27 MED ORDER — OXYCODONE HCL 5 MG PO TABS: 5.0000 mg | ORAL_TABLET | Freq: Once | ORAL | Status: DC | PRN

## 2023-10-27 MED ORDER — MORPHINE SULFATE (PF) 2 MG/ML IV SOLN: 2.0000 mg | INTRAVENOUS | Status: DC | PRN

## 2023-10-27 MED ORDER — ONDANSETRON HCL 4 MG/2ML IJ SOLN
INTRAMUSCULAR | Status: AC
  Filled 2023-10-27: qty 2

## 2023-10-27 MED ORDER — METRONIDAZOLE 500 MG/100ML IV SOLN
500.0000 mg | Freq: Two times a day (BID) | INTRAVENOUS | Status: AC
  Administered 2023-10-27 – 2023-10-28 (×2): 500 mg via INTRAVENOUS
  Filled 2023-10-27 (×2): qty 100

## 2023-10-27 MED ORDER — ORAL CARE MOUTH RINSE: 15.0000 mL | Freq: Once | OROMUCOSAL | Status: AC

## 2023-10-27 MED ORDER — PHENYLEPHRINE HCL-NACL 20-0.9 MG/250ML-% IV SOLN
INTRAVENOUS | Status: DC | PRN
  Administered 2023-10-27: 25 ug/min via INTRAVENOUS

## 2023-10-27 MED ORDER — PHENYLEPHRINE 80 MCG/ML (10ML) SYRINGE FOR IV PUSH (FOR BLOOD PRESSURE SUPPORT)
PREFILLED_SYRINGE | INTRAVENOUS | Status: DC | PRN
  Administered 2023-10-27: 80 ug via INTRAVENOUS
  Administered 2023-10-27: 160 ug via INTRAVENOUS
  Administered 2023-10-27: 80 ug via INTRAVENOUS
  Administered 2023-10-27: 160 ug via INTRAVENOUS

## 2023-10-27 MED ORDER — POTASSIUM CHLORIDE CRYS ER 20 MEQ PO TBCR
40.0000 meq | EXTENDED_RELEASE_TABLET | Freq: Once | ORAL | Status: AC
  Administered 2023-10-27: 40 meq via ORAL
  Filled 2023-10-27: qty 2

## 2023-10-27 MED ORDER — CHLORHEXIDINE GLUCONATE 0.12 % MT SOLN
OROMUCOSAL | Status: AC
  Administered 2023-10-27: 15 mL via OROMUCOSAL
  Filled 2023-10-27: qty 15

## 2023-10-27 MED ORDER — VANCOMYCIN HCL 2000 MG/400ML IV SOLN
2000.0000 mg | Freq: Once | INTRAVENOUS | Status: AC
  Administered 2023-10-27: 2000 mg via INTRAVENOUS
  Filled 2023-10-27: qty 400

## 2023-10-27 MED ORDER — LACTATED RINGERS IV BOLUS
1000.0000 mL | Freq: Once | INTRAVENOUS | Status: AC
  Administered 2023-10-27: 1000 mL via INTRAVENOUS

## 2023-10-27 MED ORDER — LACTATED RINGERS IV SOLN: INTRAVENOUS | Status: DC

## 2023-10-27 MED ORDER — SODIUM CHLORIDE 0.9 % IV SOLN
2.0000 g | INTRAVENOUS | Status: AC
  Administered 2023-10-27: 2 g via INTRAVENOUS
  Filled 2023-10-27: qty 20

## 2023-10-27 MED ORDER — SUGAMMADEX SODIUM 200 MG/2ML IV SOLN
INTRAVENOUS | Status: DC | PRN
  Administered 2023-10-27: 200 mg via INTRAVENOUS

## 2023-10-27 MED ORDER — LIDOCAINE 2% (20 MG/ML) 5 ML SYRINGE
INTRAMUSCULAR | Status: DC | PRN
  Administered 2023-10-27: 100 mg via INTRAVENOUS

## 2023-10-27 MED ORDER — SODIUM CHLORIDE 0.9 % IV SOLN
2.0000 g | Freq: Once | INTRAVENOUS | Status: AC
  Administered 2023-10-27: 2 g via INTRAVENOUS
  Filled 2023-10-27: qty 12.5

## 2023-10-27 MED ORDER — MAGNESIUM OXIDE -MG SUPPLEMENT 400 (240 MG) MG PO TABS
800.0000 mg | ORAL_TABLET | Freq: Once | ORAL | Status: AC
  Administered 2023-10-27: 800 mg via ORAL
  Filled 2023-10-27: qty 2

## 2023-10-27 MED ORDER — FENTANYL CITRATE (PF) 100 MCG/2ML IJ SOLN: 25.0000 ug | INTRAMUSCULAR | Status: DC | PRN

## 2023-10-27 MED ORDER — IOHEXOL 350 MG/ML SOLN
75.0000 mL | Freq: Once | INTRAVENOUS | Status: AC | PRN
  Administered 2023-10-27: 75 mL via INTRAVENOUS

## 2023-10-27 MED ORDER — LIDOCAINE 2% (20 MG/ML) 5 ML SYRINGE
INTRAMUSCULAR | Status: AC
  Filled 2023-10-27: qty 5

## 2023-10-27 MED ORDER — LORAZEPAM 2 MG/ML IJ SOLN: 1.0000 mg | INTRAMUSCULAR | Status: AC | PRN

## 2023-10-27 MED ORDER — BUPIVACAINE-EPINEPHRINE (PF) 0.25% -1:200000 IJ SOLN
INTRAMUSCULAR | Status: AC
  Filled 2023-10-27: qty 30

## 2023-10-27 MED ORDER — THIAMINE HCL 100 MG/ML IJ SOLN
100.0000 mg | Freq: Every day | INTRAMUSCULAR | Status: DC
  Filled 2023-10-27 (×2): qty 2

## 2023-10-27 MED ORDER — ACETAMINOPHEN 500 MG PO TABS
1000.0000 mg | ORAL_TABLET | Freq: Four times a day (QID) | ORAL | Status: DC
  Administered 2023-10-27 – 2023-10-31 (×14): 1000 mg via ORAL
  Filled 2023-10-27 (×15): qty 2

## 2023-10-27 MED ORDER — 0.9 % SODIUM CHLORIDE (POUR BTL) OPTIME
TOPICAL | Status: DC | PRN
  Administered 2023-10-27: 1000 mL

## 2023-10-27 MED ORDER — ROCURONIUM BROMIDE 10 MG/ML (PF) SYRINGE
PREFILLED_SYRINGE | INTRAVENOUS | Status: DC | PRN
  Administered 2023-10-27: 60 mg via INTRAVENOUS

## 2023-10-27 MED ORDER — FOLIC ACID 1 MG PO TABS
1.0000 mg | ORAL_TABLET | Freq: Every day | ORAL | Status: DC
  Administered 2023-10-28 – 2023-10-31 (×4): 1 mg via ORAL
  Filled 2023-10-27 (×4): qty 1

## 2023-10-27 MED ORDER — OXYCODONE HCL 5 MG/5ML PO SOLN: 5.0000 mg | Freq: Once | ORAL | Status: DC | PRN

## 2023-10-27 MED ORDER — ONDANSETRON 4 MG PO TBDP: 4.0000 mg | ORAL_TABLET | Freq: Four times a day (QID) | ORAL | Status: DC | PRN

## 2023-10-27 MED ORDER — ENOXAPARIN SODIUM 40 MG/0.4ML IJ SOSY
40.0000 mg | PREFILLED_SYRINGE | INTRAMUSCULAR | Status: DC
  Administered 2023-10-28 – 2023-10-31 (×4): 40 mg via SUBCUTANEOUS
  Filled 2023-10-27 (×4): qty 0.4

## 2023-10-27 MED ORDER — DEXAMETHASONE SODIUM PHOSPHATE 10 MG/ML IJ SOLN
INTRAMUSCULAR | Status: DC | PRN
  Administered 2023-10-27: 10 mg via INTRAVENOUS

## 2023-10-27 SURGICAL SUPPLY — 42 items
APPLIER CLIP 5 13 M/L LIGAMAX5 (MISCELLANEOUS) IMPLANT
BAG COUNTER SPONGE SURGICOUNT (BAG) ×1 IMPLANT
CANISTER SUCT 3000ML PPV (MISCELLANEOUS) ×1 IMPLANT
CHLORAPREP W/TINT 26 (MISCELLANEOUS) ×1 IMPLANT
CLIP APPLIE 5 13 M/L LIGAMAX5 (MISCELLANEOUS) IMPLANT
CLSR STERI-STRIP ANTIMIC 1/2X4 (GAUZE/BANDAGES/DRESSINGS) IMPLANT
COVER SURGICAL LIGHT HANDLE (MISCELLANEOUS) ×1 IMPLANT
CUTTER FLEX LINEAR 45M (STAPLE) ×1 IMPLANT
DERMABOND ADVANCED .7 DNX12 (GAUZE/BANDAGES/DRESSINGS) ×1 IMPLANT
ELECT REM PT RETURN 9FT ADLT (ELECTROSURGICAL) ×1 IMPLANT
ELECTRODE REM PT RTRN 9FT ADLT (ELECTROSURGICAL) ×1 IMPLANT
GLOVE BIO SURGEON STRL SZ7 (GLOVE) ×1 IMPLANT
GLOVE BIOGEL PI IND STRL 7.5 (GLOVE) ×1 IMPLANT
GOWN STRL REUS W/ TWL LRG LVL3 (GOWN DISPOSABLE) ×3 IMPLANT
GRASPER SUT TROCAR 14GX15 (MISCELLANEOUS) ×1 IMPLANT
IRRIG SUCT STRYKERFLOW 2 WTIP (MISCELLANEOUS) ×1 IMPLANT
IRRIGATION SUCT STRKRFLW 2 WTP (MISCELLANEOUS) ×1 IMPLANT
KIT BASIN OR (CUSTOM PROCEDURE TRAY) ×1 IMPLANT
KIT TURNOVER KIT B (KITS) ×1 IMPLANT
NS IRRIG 1000ML POUR BTL (IV SOLUTION) ×1 IMPLANT
PAD ARMBOARD 7.5X6 YLW CONV (MISCELLANEOUS) ×2 IMPLANT
POUCH RETRIEVAL ECOSAC 10 (ENDOMECHANICALS) ×1 IMPLANT
RELOAD 45 VASCULAR/THIN (ENDOMECHANICALS) IMPLANT
RELOAD STAPLE 45 2.5 WHT GRN (ENDOMECHANICALS) IMPLANT
RELOAD STAPLE 45 3.5 BLU ETS (ENDOMECHANICALS) IMPLANT
RELOAD STAPLE TA45 3.5 REG BLU (ENDOMECHANICALS) ×2 IMPLANT
SCISSORS LAP 5X35 DISP (ENDOMECHANICALS) IMPLANT
SET TUBE SMOKE EVAC HIGH FLOW (TUBING) ×1 IMPLANT
SHEARS HARMONIC ACE PLUS 36CM (ENDOMECHANICALS) ×1 IMPLANT
SLEEVE Z-THREAD 5X100MM (TROCAR) ×1 IMPLANT
SPECIMEN JAR SMALL (MISCELLANEOUS) ×1 IMPLANT
STRIP CLOSURE SKIN 1/2X4 (GAUZE/BANDAGES/DRESSINGS) ×1 IMPLANT
SUT MNCRL AB 4-0 PS2 18 (SUTURE) ×1 IMPLANT
SUT VICRYL 0 UR6 27IN ABS (SUTURE) ×1 IMPLANT
TOWEL GREEN STERILE (TOWEL DISPOSABLE) ×1 IMPLANT
TOWEL GREEN STERILE FF (TOWEL DISPOSABLE) ×1 IMPLANT
TRAY FOLEY MTR SLVR 16FR STAT (SET/KITS/TRAYS/PACK) IMPLANT
TRAY LAPAROSCOPIC MC (CUSTOM PROCEDURE TRAY) ×1 IMPLANT
TROCAR BALLN 12MMX100 BLUNT (TROCAR) ×1 IMPLANT
TROCAR Z-THREAD OPTICAL 5X100M (TROCAR) ×1 IMPLANT
WARMER LAPAROSCOPE (MISCELLANEOUS) ×1 IMPLANT
WATER STERILE IRR 1000ML POUR (IV SOLUTION) ×1 IMPLANT

## 2023-10-27 NOTE — Anesthesia Postprocedure Evaluation (Signed)
 Anesthesia Post Note  Patient: Jason Compton  Procedure(s) Performed: APPENDECTOMY, LAPAROSCOPIC     Patient location during evaluation: PACU Anesthesia Type: General Level of consciousness: awake Pain management: pain level controlled Vital Signs Assessment: post-procedure vital signs reviewed and stable Respiratory status: spontaneous breathing, nonlabored ventilation and respiratory function stable Cardiovascular status: blood pressure returned to baseline and stable Postop Assessment: no apparent nausea or vomiting Anesthetic complications: no   No notable events documented.  Last Vitals:  Vitals:   10/27/23 1500 10/27/23 1515  BP: 106/63 113/60  Pulse: 71 72  Resp: 18 13  Temp:    SpO2: 92% 93%    Last Pain:  Vitals:   10/27/23 1500  TempSrc:   PainSc: 0-No pain                 Linton Rump

## 2023-10-27 NOTE — Anesthesia Procedure Notes (Signed)
 Procedure Name: Intubation Date/Time: 10/27/2023 12:11 PM  Performed by: Garfield Cornea, CRNAPre-anesthesia Checklist: Patient identified, Emergency Drugs available, Suction available and Patient being monitored Patient Re-evaluated:Patient Re-evaluated prior to induction Oxygen Delivery Method: Circle System Utilized Preoxygenation: Pre-oxygenation with 100% oxygen Induction Type: IV induction Ventilation: Mask ventilation without difficulty Laryngoscope Size: Mac and 3 Grade View: Grade I Tube type: Oral Tube size: 7.5 mm Number of attempts: 1 Airway Equipment and Method: Stylet Placement Confirmation: ETT inserted through vocal cords under direct vision, positive ETCO2 and breath sounds checked- equal and bilateral Secured at: 23 cm Tube secured with: Tape Dental Injury: Teeth and Oropharynx as per pre-operative assessment

## 2023-10-27 NOTE — Plan of Care (Signed)
   Problem: Health Behavior/Discharge Planning: Goal: Ability to manage health-related needs will improve Outcome: Progressing

## 2023-10-27 NOTE — Transfer of Care (Signed)
 Immediate Anesthesia Transfer of Care Note  Patient: Jason Compton  Procedure(s) Performed: APPENDECTOMY, LAPAROSCOPIC  Patient Location: PACU  Anesthesia Type:General  Level of Consciousness: awake, alert , and oriented  Airway & Oxygen Therapy: Patient Spontanous Breathing  Post-op Assessment: Report given to RN and Post -op Vital signs reviewed and stable  Post vital signs: Reviewed and stable  Last Vitals:  Vitals Value Taken Time  BP 111/84 10/27/23 1300  Temp    Pulse 78 10/27/23 1302  Resp 14 10/27/23 1302  SpO2 96 % 10/27/23 1302  Vitals shown include unfiled device data.  Last Pain:  Vitals:   10/27/23 1136  TempSrc:   PainSc: 3          Complications: No notable events documented.

## 2023-10-27 NOTE — Progress Notes (Signed)
 PHARMACY - PHYSICIAN COMMUNICATION CRITICAL VALUE ALERT - BLOOD CULTURE IDENTIFICATION (BCID)  Jason Compton is an 88 y.o. male who presented to [facility] on 10/27/2023 with a chief complaint of acute appendicitis s/p lap appendectomy.   Assessment:  Blood culture 2/4 (1 set) positive for Ecoli, no resistancce.   Name of physician (or Provider) Contacted: Dr. Gaynelle Adu   Current antibiotics: none but received cefepime and ceftriaxone 3/6   Changes to prescribed antibiotics recommended:  Restart ceftriaxone- Recommendations accepted by provider  No results found for this or any previous visit.  [name], PharmD, BCPS, BCCP Clinical Pharmacist  Please check AMION for all [facility] Pharmacy phone numbers After 10:00 PM, call Main Pharmacy [phone]

## 2023-10-27 NOTE — ED Provider Notes (Signed)
  Physical Exam  BP 97/62   Pulse 86   Temp 98.8 F (37.1 C) (Oral)   Resp 19   Ht 5\' 6"  (1.676 m)   Wt 91.2 kg   SpO2 100%   BMI 32.45 kg/m   Physical Exam Vitals and nursing note reviewed.  HENT:     Head: Normocephalic and atraumatic.  Eyes:     Pupils: Pupils are equal, round, and reactive to light.  Cardiovascular:     Rate and Rhythm: Normal rate and regular rhythm.  Pulmonary:     Effort: Pulmonary effort is normal.     Breath sounds: Normal breath sounds.  Abdominal:     Palpations: Abdomen is soft.     Tenderness: There is no abdominal tenderness.  Skin:    General: Skin is warm and dry.  Neurological:     Mental Status: He is alert.  Psychiatric:        Mood and Affect: Mood normal.     Procedures  Procedures  ED Course / MDM   Clinical Course as of 10/27/23 1054  Thu Oct 27, 2023  0809 CT abdomen pelvis consistent with acute appendicitis.  Discussed with surgical PA on-call who will evaluate patient in the ED.  He remains hemodynamically stable at this time.  Pain well-controlled [MP]  1015 Discussed with surgical team who was evaluated patient in the ED.  Patient will be admitted to surgical service for acute appendicitis [MP]    Clinical Course User Index [MP] Royanne Foots, DO   Medical Decision Making I, Estelle June DO, have assumed care of this patient from the previous provider pending CT chest abdomen pelvis, reevaluation and disposition.  In brief this is an 88 year old male presenting with fever cough and mild right lower quadrant tenderness on initial exam.  Initial lab work notable for elevation in lactic acid which is downtrending.  Hyponatremia hypokalemia.  He has received antibiotics (cefepime and vancomycin) after meeting SIRS criteria.  Remains hemodynamically stable at this time  Amount and/or Complexity of Data Reviewed Labs: ordered. Radiology: ordered.  Risk OTC drugs. Prescription drug management. Decision regarding  hospitalization.          Royanne Foots, DO 10/27/23 1054

## 2023-10-27 NOTE — Progress Notes (Signed)
 ED Pharmacy Antibiotic Sign Off An antibiotic consult was received from an ED provider for vancomycin and cefepime per pharmacy dosing for sepsis. A chart review was completed to assess appropriateness.   The following one time order(s) were placed:  Vancomycin 2000mg  IV x1 Cefepime 2g IV x1  Further antibiotic and/or antibiotic pharmacy consults should be ordered by the admitting provider if indicated.   Thank you for allowing pharmacy to be a part of this patient's care.   Vernard Gambles, PharmD, BCPS   Clinical Pharmacist 10/27/23 5:07 AM

## 2023-10-27 NOTE — ED Provider Notes (Signed)
 [place] EMERGENCY DEPARTMENT AT [facility] Provider Note  CSN: [id number] Arrival date & time: 10/27/23 4782  Chief Complaint(s) Fever  HPI CAMDEN KNOTEK is a 88 y.o. male with PMH CAD, HTN, HLD, OSA on CPAP, prostate cancer who presents emergency department for evaluation of fever, cough and abdominal pain.  States symptoms began yesterday.  EMS called today and found the patient to have softer blood pressures with systolics in the low 100sfebrile in the field.,  No additional medications given prior to arrival.  Patient arrives alert and answering questions appropriately.  Denies chest pain, shortness of breath, headache or other systemic symptoms.   Past Medical History Past Medical History:  Diagnosis Date   Aortic atherosclerosis (HCC)    CAD (coronary artery disease)    Daytime sleepiness    Exogenous obesity    GERD (gastroesophageal reflux disease)    History of hiatal hernia    Hyperlipidemia    Hypertension    OSA on CPAP    Prostate cancer (HCC)    PVC's (premature ventricular contractions)    Skin cancer    Patient Active Problem List   Diagnosis Date Noted   Community acquired pneumonia of left lower lobe of lung 08/31/2023   History of skin cancer 06/14/2023   Rash and nonspecific skin eruption 01/26/2021   Lipoma 11/18/2020   Malignant neoplasm of prostate (HCC) 06/29/2020   Inguinal hernia 05/06/2020   Cough 10/10/2018   Allergic rhinitis 07/25/2018   OSA on CPAP 05/08/2018   History of adenomatous polyp of colon 02/15/2018   Internal hemorrhoids 02/15/2018   Cervical spondylosis without myelopathy 10/13/2017   Aortic atherosclerosis (HCC) 04/28/2017   Coronary artery disease 04/28/2017   Prediabetes 11/02/2016   GERD (gastroesophageal reflux disease) 10/29/2016   Diverticulosis 07/30/2013   Hard of hearing 07/30/2013   Hypertension 03/08/2011   Hypercholesterolemia 03/08/2011   Home Medication(s) Prior to Admission medications    Medication Sig Start Date End Date Taking? Authorizing Provider  amLODipine (NORVASC) 5 MG tablet TAKE 1 TABLET BY MOUTH DAILY 04/26/23   Chilton Si, MD  chlorpheniramine-HYDROcodone (TUSSIONEX) 10-8 MG/5ML Take 5 mLs by mouth every 12 (twelve) hours as needed for cough. 08/20/23   Radford Pax, NP  cholecalciferol (VITAMIN D) 1000 UNITS tablet Take 1,000 Units by mouth daily.    [provider]  cycloSPORINE (RESTASIS) 0.05 % ophthalmic emulsion Place 1 drop into both eyes 2 (two) times daily.    [provider]  Evolocumab (REPATHA SURECLICK) 140 MG/ML SOAJ ADMINISTER 1 ML UNDER THE SKIN EVERY 14 DAYS 04/29/23   Chilton Si, MD  ezetimibe (ZETIA) 10 MG tablet TAKE 1 TABLET BY MOUTH DAILY 04/26/23   Chilton Si, MD  famotidine (PEPCID) 20 MG tablet Take 20 mg by mouth daily.    [provider]  hydrochlorothiazide (HYDRODIURIL) 25 MG tablet TAKE 1 TABLET BY MOUTH DAILY 07/05/23   Chilton Si, MD  Multiple Vitamin (MULTIVITAMIN) tablet Take 1 tablet by mouth daily.    [provider]  OVER THE COUNTER MEDICATION hydroEyes 2 tablets twice a day    [provider]  vitamin C (ASCORBIC ACID) 500 MG tablet Take 500 mg by mouth daily.    [provider]  Past Surgical History Past Surgical History:  Procedure Laterality Date   CARDIOVASCULAR STRESS TEST  04/18/2009   NORMAL   CATARACT EXTRACTION     Family History Family History  Problem Relation Age of Onset   Osteoporosis Mother    Cancer Father        skin   Skin cancer Father    Hypertension Brother    Skin cancer Brother    Breast cancer Neg Hx    Colon cancer Neg Hx    Pancreatic cancer Neg Hx    Prostate cancer Neg Hx     Social History Social History   Tobacco Use   Smoking status: Never   Smokeless tobacco: Never  Vaping  Use   Vaping status: Never Used  Substance Use Topics   Alcohol use: Yes    Alcohol/week: 0.0 standard drinks of alcohol    Comment: wine   Drug use: No   Allergies Altace [ramipril], Lipitor [atorvastatin], and Rosuvastatin  Review of Systems Review of Systems  Constitutional:  Positive for fever.  Respiratory:  Positive for cough.   Gastrointestinal:  Positive for abdominal pain.    Physical Exam Vital Signs  I have reviewed the triage vital signs Temp (!) 102.6 F (39.2 C) (Oral)   Ht 5\' 6"  (1.676 m)   Wt 91.2 kg   BMI 32.45 kg/m   Physical Exam Constitutional:      General: He is not in acute distress.    Appearance: Normal appearance.  HENT:     Head: Normocephalic and atraumatic.     Nose: No congestion or rhinorrhea.  Eyes:     General:        Right eye: No discharge.        Left eye: No discharge.     Extraocular Movements: Extraocular movements intact.     Pupils: Pupils are equal, round, and reactive to light.  Cardiovascular:     Rate and Rhythm: Normal rate and regular rhythm.     Heart sounds: No murmur heard. Pulmonary:     Effort: No respiratory distress.     Breath sounds: No wheezing or rales.  Abdominal:     General: There is no distension.     Tenderness: There is no abdominal tenderness.  Musculoskeletal:        General: Normal range of motion.     Cervical back: Normal range of motion.  Skin:    General: Skin is warm and dry.  Neurological:     General: No focal deficit present.     Mental Status: He is alert.     ED Results and Treatments Labs (all labs ordered are listed, but only abnormal results are displayed) Labs Reviewed  RESP PANEL BY RT-PCR (RSV, FLU A&B, COVID)  RVPGX2  CULTURE, BLOOD (ROUTINE X 2)  CULTURE, BLOOD (ROUTINE X 2)  COMPREHENSIVE METABOLIC PANEL  CBC WITH DIFFERENTIAL/PLATELET  PROTIME-INR  APTT  URINALYSIS, W/ REFLEX TO CULTURE (INFECTION SUSPECTED)  I-STAT CG4 LACTIC ACID, ED  Radiology No results found.  Pertinent labs & imaging results that were available during my care of the patient were reviewed by me and considered in my medical decision making (see MDM for details).  Medications Ordered in ED Medications - No data to display                                                                                                                                   Procedures .Critical Care  Performed by: Glendora Score, MD Authorized by: Glendora Score, MD   Critical care provider statement:    Critical care time (minutes):  30   Critical care was necessary to treat or prevent imminent or life-threatening deterioration of the following conditions:  Sepsis   Critical care was time spent personally by me on the following activities:  Development of treatment plan with patient or surrogate, discussions with consultants, evaluation of patient's response to treatment, examination of patient, ordering and review of laboratory studies, ordering and review of radiographic studies, ordering and performing treatments and interventions, pulse oximetry, re-evaluation of patient's condition and review of old charts   (including critical care time)  Medical Decision Making / ED Course   This patient presents to the ED for concern of fever, abdominal pain, cough, this involves an extensive number of treatment options, and is a complaint that carries with it a high risk of complications and morbidity.  The differential diagnosis includes appendicitis, nephrolithiasis, diverticulitis, epiploic appendagitis, inflammatory bowel disease, constipation, gastroenteritis,, COVID, flu, RSV, bacteremia  MDM: Patient seen emergency room for evaluation of fever, cough, abdominal pain.  Patient arrives febrile to 102.6.  Physical exam with some mild tenderness in the right lower quadrant but is  otherwise unremarkable.  Laboratory evaluation with no significant leukocytosis, initial lactic acid 3.8, sodium 127, potassium 3.1, CO2 19, albumin 2.9, total bili 2.4.  Patient meet SIRS criteria on arrival and with elevated lactic acid, empirically treated with Vanco and cefepime.  Electrolytes repleted.  Lactic acid improved after fluid resuscitation.  Chest x-ray with no evidence of focal pneumonia.  Pending CT imaging at time of signout.  Please see provider signout for continuation of workup.   Additional history obtained: -Additional history obtained from wife -External records from outside source obtained and reviewed including: Chart review including previous notes, labs, imaging, consultation notes   Lab Tests: -I ordered, reviewed, and interpreted labs.   The pertinent results include:   Labs Reviewed  RESP PANEL BY RT-PCR (RSV, FLU A&B, COVID)  RVPGX2  CULTURE, BLOOD (ROUTINE X 2)  CULTURE, BLOOD (ROUTINE X 2)  COMPREHENSIVE METABOLIC PANEL  CBC WITH DIFFERENTIAL/PLATELET  PROTIME-INR  APTT  URINALYSIS, W/ REFLEX TO CULTURE (INFECTION SUSPECTED)  I-STAT CG4 LACTIC ACID, ED      EKG   EKG Interpretation Date/Time:  Thursday October 27 2023 04:50:55 EST Ventricular Rate:  87 PR Interval:  166 QRS Duration:  104 QT Interval:  353 QTC Calculation: 425 R Axis:   -61  Text Interpretation: Sinus rhythm Confirmed by Raegyn Renda ([id number]) on 10/27/2023 4:52:50 AM         Imaging Studies ordered: I ordered imaging studies including chest x-ray I independently visualized and interpreted imaging. I agree with the radiologist interpretation  CT chest abdomen pelvis pending   Medicines ordered and prescription drug management: No orders of the defined types were placed in this encounter.   -I have reviewed the patients home medicines and have made adjustments as needed  Critical interventions Fluids, antibiotics    Cardiac Monitoring: The patient was maintained  on a cardiac monitor.  I personally viewed and interpreted the cardiac monitored which showed an underlying rhythm of: NSR  Social Determinants of Health:  Factors impacting patients care include: none   Reevaluation: After the interventions noted above, I reevaluated the patient and found that they have :improved  Co morbidities that complicate the patient evaluation  Past Medical History:  Diagnosis Date   Aortic atherosclerosis (HCC)    CAD (coronary artery disease)    Daytime sleepiness    Exogenous obesity    GERD (gastroesophageal reflux disease)    History of hiatal hernia    Hyperlipidemia    Hypertension    OSA on CPAP    Prostate cancer (HCC)    PVC's (premature ventricular contractions)    Skin cancer       Dispostion: I considered admission for this patient, and patient pending imaging studies at time of signout.  Please see provider signout for continuation of workup.    Final Clinical Impression(s) / ED Diagnoses Final diagnoses:  None     @PCDICTATION @    Glendora Score, MD 10/27/23 [phone]

## 2023-10-27 NOTE — Discharge Instructions (Addendum)
 [facility]  Please arrive at least 30 min before your appointment to complete your check in paperwork.  If you are unable to arrive 30 min prior to your appointment time we may have to cancel or reschedule you. LAPAROSCOPIC SURGERY: POST OP INSTRUCTIONS Always review your discharge instruction sheet given to you by the facility where your surgery was performed. IF YOU HAVE DISABILITY OR FAMILY LEAVE FORMS, YOU MUST BRING THEM TO THE OFFICE FOR PROCESSING.   DO NOT GIVE THEM TO YOUR DOCTOR.  PAIN CONTROL  First take acetaminophen (Tylenol) AND/or ibuprofen (Advil) to control your pain after surgery.  Follow directions on package.  Taking acetaminophen (Tylenol) and/or ibuprofen (Advil) regularly after surgery will help to control your pain and lower the amount of prescription pain medication you may need.  You should not take more than 4,000 mg (4 grams) of acetaminophen (Tylenol) in 24 hours.  You should not take ibuprofen (Advil), aleve, motrin, naprosyn or other NSAIDS if you have a history of stomach ulcers or chronic kidney disease.  A prescription for pain medication may be given to you upon discharge.  Take your pain medication as prescribed, if you still have uncontrolled pain after taking acetaminophen (Tylenol) or ibuprofen (Advil). Use ice packs to help control pain. If you need a refill on your pain medication, please contact your pharmacy.  They will contact our office to request authorization. Prescriptions will not be filled after 5pm or on week-ends.  HOME MEDICATIONS Take your usually prescribed medications unless otherwise directed.  DIET You should follow a light diet the first few days after arrival home.  Be sure to include lots of fluids daily. Avoid fatty, fried foods.   CONSTIPATION It is common to experience some constipation after surgery and if you are taking pain medication.  Increasing fluid intake and taking a stool softener (such as Colace)  will usually help or prevent this problem from occurring.  A mild laxative (Milk of Magnesia or Miralax) should be taken according to package instructions if there are no bowel movements after 48 hours.  WOUND/INCISION CARE Most patients will experience some swelling and bruising in the area of the incisions.  Ice packs will help.  Swelling and bruising can take several days to resolve.  Unless discharge instructions indicate otherwise, follow guidelines below  STERI-STRIPS - you may remove your outer bandages 48 hours after surgery, and you may shower at that time.  You have steri-strips (small skin tapes) in place directly over the incision.  These strips should be left on the skin for 7-10 days.   DERMABOND/SKIN GLUE - you may shower in 24 hours.  The glue will flake off over the next 2-3 weeks. Any sutures or staples will be removed at the office during your follow-up visit.  ACTIVITIES You may resume regular (light) daily activities beginning the next day--such as daily self-care, walking, climbing stairs--gradually increasing activities as tolerated.  You may have sexual intercourse when it is comfortable.  Refrain from any heavy lifting or straining until approved by your doctor. You may drive when you are no longer taking prescription pain medication, you can comfortably wear a seatbelt, and you can safely maneuver your car and apply brakes.  FOLLOW-UP You should see your doctor in the office for a follow-up appointment approximately 2-3 weeks after your surgery.  You should have been given your post-op/follow-up appointment when your surgery was scheduled.  If you did not receive a post-op/follow-up appointment, make sure  that you call for this appointment within a day or two after you arrive home to insure a convenient appointment time.   WHEN TO CALL YOUR DOCTOR: Fever over 101.0 Inability to urinate Continued bleeding from incision. Increased pain, redness, or drainage from the  incision. Increasing abdominal pain  The clinic staff is available to answer your questions during regular business hours.  Please don't hesitate to call and ask to speak to one of the nurses for clinical concerns.  If you have a medical emergency, go to the nearest emergency room or call 911.  A surgeon from [facility] is always on call at the hospital. [address], [place], [place]  [address] ? [address] [address], [place], [place]   [address] [phone] ? [phone] ? FAX [phone]   Post Anesthesia Home Care Instructions  Activity: Get plenty of rest for the remainder of the day. A responsible individual must stay with you for 24 hours following the procedure.  For the next 24 hours, DO NOT: -Drive a car -Advertising copywriter -Drink alcoholic beverages -Take any medication unless instructed by your physician -Make any legal decisions or sign important papers.  Meals: Start with liquid foods such as gelatin or soup. Progress to regular foods as tolerated. Avoid greasy, spicy, heavy foods. If nausea and/or vomiting occur, drink only clear liquids until the nausea and/or vomiting subsides. Call your physician if vomiting continues.  Special Instructions/Symptoms: Your throat may feel dry or sore from the anesthesia or the breathing tube placed in your throat during surgery. If this causes discomfort, gargle with warm salt water. The discomfort should disappear within 24 hours.                                                 Outpatient Substance Abuse  Treatment- uninsured  Narcotics Anonymous 24-HOUR HELPLINE Pre-recorded for Meeting Schedules [facility] [phone]  [email or web address] ALCOHOLICS ANONYMOUS  [facility]  Answering Service [phone] Please Note: All [place] Meetings are Non-smoking FindSpice.es  Alcohol and Drug Services -  Insurance: Medicaid /State funding/private insurance Methadone,  suboxone/Intensive outpatient  [place]   [phone] Fax: [phone] [address] [place], [place], [address] [place] [phone] Fax: [phone]    [address], [place], [place], [address] ([address] [place], [place], [place], [place], [place], [place], [place], [place]) [facility] [email or web address].[email or web address]/ Accepts State funding/Medicaid Transitional housing, Intensive Outpatient Treatment, Outpatient treatment, Veterans Services  Phone: [phone] Fax: [phone] Address: [address], [place] [address]   [employer] of Care (http://carterscircleofcare.info/) Insurance: Medicaid Case Management, Administrator, arts, Medication Management, Outpatient Therapy, Psychosocial Rehabilitation, Substance Abuse Intensive Outpatient  Phone: [phone] Fax: [phone] [address], [place], [place], [address]  [facility] Medicaid, most private insurance providers Types of Program: Individual/Group Therapy, Substance Abuse Treatment  Phone: [place] [phone] Fax: [phone] [address], [place], [place], [address] [place] [phone] [address] [place], [place], [address]  [facility]  Medicaid Types of Program: SAIOP  Phone: [phone] Fax: [phone] [address] [place], [place], [place], [address] [facility] Medicaid/state funds Crisis line [phone] [place] [phone] [place] [phone] [address] [place] [address] [place] [phone]  [facility] [address];  [place],  [place] [address] [phone]  Substance Abuse Intensive Outpatient Program [facility] [address] [place], [place] [address] [phone]- Substance abuse treatment  Successful Transitions  Insurance: [facility], [address], sliding scale Types of Program: substance abuse treatment, transportation assistance Phone:  [phone] Fax: [phone] Address: [address], [place] [address] [facility] (TrendSwap.ch) Insurance: UHC, [place], [place], [place] Program: addiction counseling, detoxification,  Phone: [phone]  Fax: [phone] Address: [address] [place], [place] [address]  [facility] (statewide facilities/programs) [address] (Medicaid/state funds) [place], [place] [address]                      http://barrett.com/ [phone] Marcy Panning- [phone] [place]- [phone] [facility] [place] (2 Locations) (Medicaid/state funds) --[address]  walk in 8:30-12 and 1-2:30 [place], [id number]   [facility]- [phone] --[address] [place], [place] [address]  [phone]-485 [phone] walk in 8:30-12 and 2-3:30  [facility] state funds/medicaid [address] [place], [place] [address] [phone] [facility] (Suboxone clinic) Medicaid/state funds  [address]  [place], [place] [address] [phone]   [facility]  [address] [place], [place] [address]  [phone] (24 hours) [place]- [address] [place], [place] [address]  [phone] (24 hours) [place]- [address] [place] [phone] [place]- [address] Rosalita Levan [phone] Turner Daniels [address] [place] [phone] [facility]- Medicaid and state funds  [facility] [facility]- [address] [place], [place] [address] [phone] (24 hours) [place]- [address] [place], [place] [address] [phone] [facility]- [address] [place], [place] [address] [phone] (24 hours) [place] [address] [place], [place]  [address] [phone] [place]- [address] [place] [phone]

## 2023-10-27 NOTE — ED Triage Notes (Signed)
 Pt arrived from home via [facility] c/o fever/ Pt states that yesterday he felt week all over, abd pain, cough, and nausea. Pt noted to have fever in triage 102.6 orally. A and o x 4.

## 2023-10-27 NOTE — H&P (Addendum)
 Jason Compton 1935-12-14  [id number].    Requesting MD: Dr. Vernelle Emerald Chief Complaint/Reason for Consult: Acute Appendicitis  HPI: Jason Compton is a 88 y.o. male who presented to the ED with abdominal pain. Patient reports yesterday around 430a he began having central, lower abdominal pain that has since migrated to the RLQ. Associated fever, chills, n/v. Passing flatus and had a non-bloody, non-melanous bm since onset. No cp or sob. Voiding without issues prior to presentation but reports he has not voided since coming to the ED. No hx of similar symptoms in the past.   W/u with fever to 102.6 > 99.9, HR 155 > 91, BP 117/59, WBC 5.2, lactic 3.8 > 1.7. CT w/ acute appendicitis without perforation or abscess. Also noted to have Na 127, K 3.1, Chloride 92, Anion Gap 16, T. Bili 2.4. Given LR bolus and K in the ED. Started on abx. We were asked to see.   Past Medical History: HTN, HLD, OSA on CPAP, CAD (followed by Dr. Chilton Si, supposed to have an appointment today - last appointment 2024. Lexiscan Myoview 05/2019 that was negative for ischemia; reports he walks independently and can go up 2 flights of stairs without sob) Prior Abdominal Surgeries: None Blood Thinners: None Last PO intake: Water with potassium in the ED, otherwise before midnight Last Colonoscopy: 2019 w/ Dr. Kinnie Scales which showed 2 polyps in the sigmoid and diverticulosis of the sigmoid and descending colon. Tobacco Use: None Alcohol Use: 20-30oz wine/day  ROS: ROS As above, see hpi  Family History  Problem Relation Age of Onset   Osteoporosis Mother    Cancer Father        skin   Skin cancer Father    Hypertension Brother    Skin cancer Brother    Breast cancer Neg Hx    Colon cancer Neg Hx    Pancreatic cancer Neg Hx    Prostate cancer Neg Hx     Past Medical History:  Diagnosis Date   Aortic atherosclerosis (HCC)    CAD (coronary artery disease)    Daytime sleepiness    Exogenous  obesity    GERD (gastroesophageal reflux disease)    History of hiatal hernia    Hyperlipidemia    Hypertension    OSA on CPAP    Prostate cancer (HCC)    PVC's (premature ventricular contractions)    Skin cancer     Past Surgical History:  Procedure Laterality Date   CARDIOVASCULAR STRESS TEST  04/18/2009   NORMAL   CATARACT EXTRACTION      Social History:  reports that he has never smoked. He has never used smokeless tobacco. He reports current alcohol use. He reports that he does not use drugs.  Allergies:  Allergies  Allergen Reactions   Altace [Ramipril]     Doesn't recall reaction   Lipitor [Atorvastatin] Other (See Comments)    Joint pain, myalgias   Rosuvastatin Other (See Comments)    Myalgias    (Not in a hospital admission)    Physical Exam: Blood pressure (!) 117/59, pulse 91, temperature 99.9 F (37.7 C), temperature source Oral, resp. rate 18, height 5\' 6"  (1.676 m), weight 91.2 kg, SpO2 95%. General: pleasant, WD/WN male who is laying in bed in NAD HEENT: head is normocephalic, atraumatic.  Heart: regular, rate, and rhythm.   Lungs: CTAB, no wheezes, rhonchi, or rales noted.  Respiratory effort nonlabored Abd:  Soft, protuberant with question of mild distension, RLQ  ttp without rigidity or guarding, +BS. No masses, hernias, or organomegaly MS: trace BLE edema that is equal and he reports his baseline.  Skin: warm and dry  Psych: A&Ox4 with an appropriate affect Neuro: normal speech, thought process intact, moves all extremities, gait not assessed   Results for orders placed or performed during the hospital encounter of 10/27/23 (from the past 48 hours)  Comprehensive metabolic panel     Status: Abnormal   Collection Time: 10/27/23  4:44 AM  Result Value Ref Range   Sodium 127 (L) 135 - 145 mmol/L   Potassium 3.1 (L) 3.5 - 5.1 mmol/L   Chloride 92 (L) 98 - 111 mmol/L   CO2 19 (L) 22 - 32 mmol/L   Glucose, Bld 139 (H) 70 - 99 mg/dL    Comment:  Glucose reference range applies only to samples taken after fasting for at least 8 hours.   BUN 16 8 - 23 mg/dL   Creatinine, Ser 1.19 0.61 - 1.24 mg/dL   Calcium 8.1 (L) 8.9 - 10.3 mg/dL   Total Protein 5.6 (L) 6.5 - 8.1 g/dL   Albumin 2.9 (L) 3.5 - 5.0 g/dL   AST 25 15 - 41 U/L   ALT 15 0 - 44 U/L   Alkaline Phosphatase 48 38 - 126 U/L   Total Bilirubin 2.4 (H) 0.0 - 1.2 mg/dL   GFR, Estimated >14 >78 mL/min    Comment: (NOTE) Calculated using the CKD-EPI Creatinine Equation (2021)    Anion gap 16 (H) 5 - 15    Comment: Performed at [facility], [address] [place], [place] [address]  CBC with Differential     Status: Abnormal   Collection Time: 10/27/23  4:44 AM  Result Value Ref Range   WBC 5.2 4.0 - 10.5 K/uL   RBC 4.45 4.22 - 5.81 MIL/uL   Hemoglobin 15.1 13.0 - 17.0 g/dL   HCT 13.0 86.5 - 78.4 %   MCV 94.2 80.0 - 100.0 fL   MCH 33.9 26.0 - 34.0 pg   MCHC 36.0 30.0 - 36.0 g/dL   RDW 69.6 29.5 - 28.4 %   Platelets 166 150 - 400 K/uL   nRBC 0.0 0.0 - 0.2 %   Neutrophils Relative % 95 %   Neutro Abs 4.9 1.7 - 7.7 K/uL   Lymphocytes Relative 4 %   Lymphs Abs 0.2 (L) 0.7 - 4.0 K/uL   Monocytes Relative 1 %   Monocytes Absolute 0.0 (L) 0.1 - 1.0 K/uL   Eosinophils Relative 0 %   Eosinophils Absolute 0.0 0.0 - 0.5 K/uL   Basophils Relative 0 %   Basophils Absolute 0.0 0.0 - 0.1 K/uL   Immature Granulocytes 0 %   Abs Immature Granulocytes 0.02 0.00 - 0.07 K/uL    Comment: Performed at [facility], [address], [place], [place] [address]  Protime-INR     Status: None   Collection Time: 10/27/23  4:44 AM  Result Value Ref Range   Prothrombin Time 15.0 11.4 - 15.2 seconds   INR 1.2 0.8 - 1.2    Comment: (NOTE) INR goal varies based on device and disease states. Performed at [facility], [address], [place], [place] [address]   APTT     Status: None   Collection Time: 10/27/23  4:44 AM  Result Value Ref Range   aPTT 25 24 - 36 seconds     Comment: Performed at [facility],  [address] [place], [place] [address]  Resp panel by RT-PCR (RSV, Flu A&B, Covid) Anterior Nasal Swab     Status: None   Collection Time: 10/27/23  4:45 AM   Specimen: Anterior Nasal Swab  Result Value Ref Range   SARS Coronavirus 2 by RT PCR NEGATIVE NEGATIVE   Influenza A by PCR NEGATIVE NEGATIVE   Influenza B by PCR NEGATIVE NEGATIVE    Comment: (NOTE) The Xpert Xpress SARS-CoV-2/FLU/RSV plus assay is intended as an aid in the diagnosis of influenza from Nasopharyngeal swab specimens and should not be used as a sole basis for treatment. Nasal washings and aspirates are unacceptable for Xpert Xpress SARS-CoV-2/FLU/RSV testing.  Fact Sheet for Patients: BloggerCourse.com  Fact Sheet for Healthcare Providers: SeriousBroker.it  This test is not yet approved or cleared by the [place] FDA and has been authorized for detection and/or diagnosis of SARS-CoV-2 by FDA under an Emergency Use Authorization (EUA). This EUA will remain in effect (meaning this test can be used) for the duration of the COVID-19 declaration under Section 564(b)(1) of the Act, 21 U.S.C. section 360bbb-3(b)(1), unless the authorization is terminated or revoked.     Resp Syncytial Virus by PCR NEGATIVE NEGATIVE    Comment: (NOTE) Fact Sheet for Patients: BloggerCourse.com  Fact Sheet for Healthcare Providers: SeriousBroker.it  This test is not yet approved or cleared by the [place] FDA and has been authorized for detection and/or diagnosis of SARS-CoV-2 by FDA under an Emergency Use Authorization (EUA). This EUA will remain in effect (meaning this test can be used) for the duration of the COVID-19 declaration under Section 564(b)(1) of the Act, 21 U.S.C. section 360bbb-3(b)(1), unless the authorization is terminated or revoked.  Performed at [facility], [address], [place], [place] [address]   I-Stat Lactic Acid, ED     Status: Abnormal   Collection Time: 10/27/23  4:56 AM  Result Value Ref Range   Lactic Acid, Venous 3.8 (HH) 0.5 - 1.9 mmol/L   Comment NOTIFIED PHYSICIAN   I-Stat Lactic Acid, ED     Status: None   Collection Time: 10/27/23  6:38 AM  Result Value Ref Range   Lactic Acid, Venous 1.7 0.5 - 1.9 mmol/L   CT CHEST ABDOMEN PELVIS W CONTRAST Result Date: 10/27/2023 CLINICAL DATA:  88 year old male with sepsis. Fever. Right lower quadrant pain onset last night. Prostate cancer. EXAM: CT CHEST, ABDOMEN, AND PELVIS WITH CONTRAST TECHNIQUE: Multidetector CT imaging of the chest, abdomen and pelvis was performed following the standard protocol during bolus administration of intravenous contrast. RADIATION DOSE REDUCTION: This exam was performed according to the departmental dose-optimization program which includes automated exposure control, adjustment of the mA and/or kV according to patient size and/or use of iterative reconstruction technique. CONTRAST:  75mL OMNIPAQUE IOHEXOL 350 MG/ML SOLN COMPARISON:  PET-CT 08/08/2023.  Noncontrast chest CT 02/28/2017. FINDINGS: CT CHEST FINDINGS Cardiovascular: Calcified aortic atherosclerosis. Calcified coronary artery atherosclerosis. Heart size remains normal. No pericardial effusion. Major mediastinal vascular structures appear to be normally enhancing. Mediastinum/Nodes: Negative for mediastinal mass or lymphadenopathy. Stable subcentimeter lymph nodes since 2018. Lungs/Pleura: Chronic and progressed since 2018 subpleural lung scarring and early fibrosis bilaterally. Major airways are patent. No pleural effusion. No consolidation. No discrete pulmonary metastasis. No convincing lung inflammation at this time. Musculoskeletal: No acute or suspicious osseous lesion identified in the chest. CT ABDOMEN PELVIS FINDINGS Hepatobiliary: Negative liver and gallbladder. Pancreas: Partially  atrophied. Spleen: Negative. Adrenals/Urinary Tract: Negative adrenal glands. Kidneys  appears stable and negative, symmetric renal enhancement and contrast excretion. Decompressed ureters. Stable and negative urinary bladder. Stomach/Bowel: Large bowel redundancy, retained gas and stool. Mild wall thickening of the rectum, not significantly changed from the descend burr PET-CT diverticulosis in the sigmoid and descending colon with no active inflammation identified. Cecum on a lax mesentery located in the anterior right upper quadrant, gas distended. Dilated and inflamed appendix arising from the anteriorly situated cecum on series 3, image 85. Appendix: Location: Right abdomen, supraumbilical, tracking posteriorly from anterior cecum on a lax mesentery. Diameter: Dilated, up to 14 mm Appendicolith: None identified Mucosal hyper-enhancement: Positive Extraluminal gas: Negative Periappendiceal collection: Confluent para appendiceal mesenteric stranding. No discrete free fluid or fluid collection. Gas and fluid containing but nondilated small bowel in the abdomen. No pneumoperitoneum identified. Small volume oral contrast in the stomach. Duodenum decompressed. Vascular/Lymphatic: Aortoiliac calcified atherosclerosis. Normal caliber abdominal aorta. Major arterial structures are tortuous, patent. Portal venous system appears patent. No lymphadenopathy. Reproductive: Prostatomegaly with multiple probable biopsy clips within the prostate. Chronic fat containing left inguinal hernia is stable. Other: No pelvis free fluid. Musculoskeletal: Chronic lumbar spine degeneration including spondylolisthesis with advanced facet arthropathy. No acute or suspicious osseous lesion identified. IMPRESSION: 1. Acute Appendicitis. Dilated and inflamed appendix located in the right mid abdomen directed posteriorly from anterior right upper quadrant located cecum which is on a lax mesentery. No evidence of perforation or abscess. 2. No  other acute or inflammatory process identified in the chest, abdomen, or pelvis. Progressive bilateral lung scarring with early pulmonary fibrosis since 2018. Aortic Atherosclerosis (ICD10-I70.0). Electronically Signed   By: Odessa Fleming M.D.   On: 10/27/2023 07:32   DG Chest Port 1 View Result Date: 10/27/2023 CLINICAL DATA:  Questionable sepsis. EXAM: PORTABLE CHEST 1 VIEW COMPARISON:  PA and lateral chest 08/20/2023 FINDINGS: Interval new mild cardiomegaly.  No vascular congestion is seen. There are mild chronic interstitial changes in the lung bases exaggerated by low inspiration. No focal pneumonia is evident. The mediastinum is stable with mild aortic atherosclerosis and uncoiling. No new osseous findings.  Osteopenia and thoracic spondylosis. IMPRESSION: 1. Interval new mild cardiomegaly. No vascular congestion. 2. Mild chronic interstitial changes in the lung bases exaggerated by low inspiration. No focal pneumonia is evident. Electronically Signed   By: Almira Bar M.D.   On: 10/27/2023 06:05    Anti-infectives (From admission, onward)    Start     Dose/Rate Route Frequency Ordered Stop   10/27/23 0515  vancomycin (VANCOREADY) IVPB 2000 mg/400 mL        2,000 mg 200 mL/hr over 120 Minutes Intravenous  Once 10/27/23 0507 10/27/23 0802   10/27/23 0515  ceFEPIme (MAXIPIME) 2 g in sodium chloride 0.9 % 100 mL IVPB        2 g 200 mL/hr over 30 Minutes Intravenous  Once 10/27/23 0507 10/27/23 0543       Assessment/Plan Acute Appendicitis  History, exam and imaging consistent with acute appendicitis. No evidence of perforation or abscess on CT. Discussed operative vs non-operative intervention.  I have explained the procedure, risks, and aftercare of Laparoscopic Appendectomy.  Risks include but are not limited to anesthesia (MI, CVA, death, aspiration, prolonged intubation), bleeding, infection, wound problems, hernia, injury to surrounding structures (viscus, nerves, blood vessels, ureter),  need for conversion to open procedure or ileocecectomy, post operative ileus or abscess, stump leak, stump appendicitis and increased risk of DVT/PE.  He seems to understand and agrees to proceed with surgery. Wife is  also at bedside and in agreement. Keep NPO. Cont IV abx. If request admission postoperatively, will plan admission to observation.   FEN - NPO VTE - SCDs ID - Cefepime/Vanc given in the ED Foley - Reports he has not voided since presentation, bladder scan. UA ordered by EDP.  HTN  HLD OSA on CPAP CAD  Etoh use - CIWA  I reviewed nursing notes, last 24 h vitals and pain scores, last 48 h intake and output, last 24 h labs and trends, and last 24 h imaging results.  Jacinto Halim, [facility] 10/27/2023, 8:46 AM Please see Amion for pager number during day hours 7:00am-4:30pm

## 2023-10-27 NOTE — Anesthesia Preprocedure Evaluation (Addendum)
 Anesthesia Evaluation  Patient identified by MRN, date of birth, ID band Patient awake    Reviewed: Allergy & Precautions, NPO status , Patient's Chart, lab work & pertinent test results  History of Anesthesia Complications Negative for: history of anesthetic complications  Airway Mallampati: III  TM Distance: >3 FB Neck ROM: Full    Dental  (+) Dental Advisory Given Crowns:   Pulmonary neg shortness of breath, sleep apnea and Continuous Positive Airway Pressure Ventilation , neg COPD, neg recent URI   Pulmonary exam normal breath sounds clear to auscultation       Cardiovascular hypertension, (-) angina + CAD  (-) Past MI and (-) Cardiac Stents + dysrhythmias (PVCs)  Rhythm:Regular Rate:Normal  HLD  Normal stress test 06/12/2019   Neuro/Psych neg Seizures  Neuromuscular disease (cervical spondylosis)    GI/Hepatic Neg liver ROS, hiatal hernia,GERD  ,,Diverticulosis    Endo/Other  Prediabetes   Renal/GU negative Renal ROS   Prostate cancer    Musculoskeletal  (+) Arthritis ,    Abdominal  (+) + obese  Peds  Hematology negative hematology ROS (+) Lab Results      Component                Value               Date                      WBC                      5.2                 10/27/2023                HGB                      15.1                10/27/2023                HCT                      41.9                10/27/2023                MCV                      94.2                10/27/2023                PLT                      166                 10/27/2023              Anesthesia Other Findings   Reproductive/Obstetrics                             Anesthesia Physical Anesthesia Plan  ASA: 3  Anesthesia Plan: General   Post-op Pain Management:    Induction: Intravenous  PONV Risk Score and Plan: 2 and Ondansetron and Dexamethasone  Airway Management Planned: Oral  ETT  Additional Equipment:   Intra-op Plan:  Post-operative Plan: Extubation in OR  Informed Consent: I have reviewed the patients History and Physical, chart, labs and discussed the procedure including the risks, benefits and alternatives for the proposed anesthesia with the patient or authorized representative who has indicated his/her understanding and acceptance.     Dental advisory given  Plan Discussed with: CRNA and Anesthesiologist  Anesthesia Plan Comments: (Risks of general anesthesia discussed including, but not limited to, sore throat, hoarse voice, chipped/damaged teeth, injury to vocal cords, nausea and vomiting, allergic reactions, lung infection, heart attack, stroke, and death. All questions answered. )        Anesthesia Quick Evaluation

## 2023-10-27 NOTE — Progress Notes (Signed)
 CSW added substance abuse resources to patient's AVS.  Edwin Dada, MSW, LCSW Transitions of Care  Clinical Social Worker II [phone]

## 2023-10-27 NOTE — Op Note (Signed)
 Preoperative diagnosis: Acute appendicitis Postoperative diagnosis: Same as above Procedure: Laparoscopic appendectomy Surgeon: Dr. Harden Mo Anesthesia: General Estimated blood loss: Minimal Specimens: Appendix to pathology Complications: None Drains: None Sponge needle count was correct completion Disposition recovery stable addition  Indications: 87 year old male who presents with abdominal pain and an exam and CT scan consistent with acute appendicitis.  We discussed proceeding with laparoscopic appendectomy.  Procedure: After informed consent was obtained he was taken to the operating room.  He was already on antibiotics.  SCDs were in place.  He was placed under general anesthesia without complication.  He was prepped and draped in a standard sterile surgical fashion.  Surgical timeout was then performed.  I have treated Marcaine below his umbilicus.  I made a vertical incision.  I then grasped the fascia with Kocher clamps.  I entered the fascia sharply and the peritoneum bluntly without injury.  I placed a 0 Vicryl pursestring suture and inserted a Hassan trocar.  The abdomen was then insufflated 15 mmHg pressure.  I placed an additional 5 mm port in the lower abdomen as well as 1 in the upper abdomen.  His cecum was noted to be more medial and it was on a very lax mesentery.  His appendix was able to be identified.  He had acute appendicitis and some early necrotic portions near the base.  This was not perforated however.  I then was able to divide the appendiceal mesentery with the harmonic scalpel all the way to the base.  I was then able to with 2 staple loads come across the cecum where I did not hit the terminal ileum to divide the appendix and a portion of the cecum so that the staple line was healthy.  The appendix was placed in retrieval bag and removed from the abdomen.  This was all hemostatic.  I then removed my Hassan trocar.  I tied my pursestring down.  I placed an  additional 3-0 Vicryl sutures to completely obliterate that defect.  I then desufflated the abdomen and remove the remaining trocars.  These were closed with 4-0 Monocryl and glue.  He tolerated this well was extubated and transferred recovery stable.

## 2023-10-28 ENCOUNTER — Ambulatory Visit: Admitting: Internal Medicine

## 2023-10-28 ENCOUNTER — Encounter (HOSPITAL_COMMUNITY): Payer: Self-pay | Admitting: General Surgery

## 2023-10-28 DIAGNOSIS — B001 Herpesviral vesicular dermatitis: Secondary | ICD-10-CM | POA: Diagnosis present

## 2023-10-28 DIAGNOSIS — R7881 Bacteremia: Secondary | ICD-10-CM | POA: Diagnosis present

## 2023-10-28 DIAGNOSIS — I700 Atherosclerosis of aorta: Secondary | ICD-10-CM | POA: Diagnosis present

## 2023-10-28 DIAGNOSIS — Z888 Allergy status to other drugs, medicaments and biological substances status: Secondary | ICD-10-CM | POA: Diagnosis not present

## 2023-10-28 DIAGNOSIS — Z1152 Encounter for screening for COVID-19: Secondary | ICD-10-CM | POA: Diagnosis not present

## 2023-10-28 DIAGNOSIS — E785 Hyperlipidemia, unspecified: Secondary | ICD-10-CM | POA: Diagnosis present

## 2023-10-28 DIAGNOSIS — I2510 Atherosclerotic heart disease of native coronary artery without angina pectoris: Secondary | ICD-10-CM | POA: Diagnosis present

## 2023-10-28 DIAGNOSIS — E871 Hypo-osmolality and hyponatremia: Secondary | ICD-10-CM | POA: Diagnosis present

## 2023-10-28 DIAGNOSIS — Z808 Family history of malignant neoplasm of other organs or systems: Secondary | ICD-10-CM | POA: Diagnosis not present

## 2023-10-28 DIAGNOSIS — Z860100 Personal history of colon polyps, unspecified: Secondary | ICD-10-CM | POA: Diagnosis not present

## 2023-10-28 DIAGNOSIS — F1090 Alcohol use, unspecified, uncomplicated: Secondary | ICD-10-CM | POA: Diagnosis present

## 2023-10-28 DIAGNOSIS — Z79899 Other long term (current) drug therapy: Secondary | ICD-10-CM | POA: Diagnosis not present

## 2023-10-28 DIAGNOSIS — Z85828 Personal history of other malignant neoplasm of skin: Secondary | ICD-10-CM | POA: Diagnosis not present

## 2023-10-28 DIAGNOSIS — K219 Gastro-esophageal reflux disease without esophagitis: Secondary | ICD-10-CM | POA: Diagnosis present

## 2023-10-28 DIAGNOSIS — B9620 Unspecified Escherichia coli [E. coli] as the cause of diseases classified elsewhere: Secondary | ICD-10-CM | POA: Diagnosis present

## 2023-10-28 DIAGNOSIS — Z8546 Personal history of malignant neoplasm of prostate: Secondary | ICD-10-CM | POA: Diagnosis not present

## 2023-10-28 DIAGNOSIS — I493 Ventricular premature depolarization: Secondary | ICD-10-CM | POA: Diagnosis present

## 2023-10-28 DIAGNOSIS — R509 Fever, unspecified: Secondary | ICD-10-CM | POA: Diagnosis present

## 2023-10-28 DIAGNOSIS — E876 Hypokalemia: Secondary | ICD-10-CM | POA: Diagnosis present

## 2023-10-28 DIAGNOSIS — E872 Acidosis, unspecified: Secondary | ICD-10-CM | POA: Diagnosis present

## 2023-10-28 DIAGNOSIS — Z8262 Family history of osteoporosis: Secondary | ICD-10-CM | POA: Diagnosis not present

## 2023-10-28 DIAGNOSIS — I10 Essential (primary) hypertension: Secondary | ICD-10-CM | POA: Diagnosis present

## 2023-10-28 DIAGNOSIS — K35891 Other acute appendicitis without perforation, with gangrene: Secondary | ICD-10-CM | POA: Diagnosis present

## 2023-10-28 DIAGNOSIS — Z8249 Family history of ischemic heart disease and other diseases of the circulatory system: Secondary | ICD-10-CM | POA: Diagnosis not present

## 2023-10-28 DIAGNOSIS — G4733 Obstructive sleep apnea (adult) (pediatric): Secondary | ICD-10-CM | POA: Diagnosis present

## 2023-10-28 LAB — BASIC METABOLIC PANEL
Anion gap: 3 — ABNORMAL LOW (ref 5–15)
BUN: 17 mg/dL (ref 8–23)
CO2: 25 mmol/L (ref 22–32)
Calcium: 7.7 mg/dL — ABNORMAL LOW (ref 8.9–10.3)
Chloride: 99 mmol/L (ref 98–111)
Creatinine, Ser: 0.81 mg/dL (ref 0.61–1.24)
GFR, Estimated: >60 mL/min (ref >60)
Glucose, Bld: 135 mg/dL — ABNORMAL HIGH (ref 70–99)
Potassium: 4.3 mmol/L (ref 3.5–5.1)
Sodium: 127 mmol/L — ABNORMAL LOW (ref 135–145)

## 2023-10-28 LAB — CBC
HCT: 40.3 % (ref 39.0–52.0)
Hemoglobin: 14 g/dL (ref 13.0–17.0)
MCH: 33.7 pg (ref 26.0–34.0)
MCHC: 34.7 g/dL (ref 30.0–36.0)
MCV: 96.9 fL (ref 80.0–100.0)
Platelets: 152 10*3/uL (ref 150–400)
RBC: 4.16 MIL/uL — ABNORMAL LOW (ref 4.22–5.81)
RDW: 13.6 % (ref 11.5–15.5)
WBC: 9.3 10*3/uL (ref 4.0–10.5)
nRBC: 0 % (ref 0.0–0.2)

## 2023-10-28 LAB — PHOSPHORUS: Phosphorus: 2.7 mg/dL (ref 2.5–4.6)

## 2023-10-28 LAB — MAGNESIUM: Magnesium: 2.2 mg/dL (ref 1.7–2.4)

## 2023-10-28 LAB — SURGICAL PATHOLOGY

## 2023-10-28 NOTE — Plan of Care (Signed)
 A&Ox4 VSS on RA. Afebrile.  Abx infused per order. Pt denies pain.  Pt up ad lib after two walks with mobility aid. Pt steady and no reports of dizziness.  Wife at bedside.   Problem: Clinical Measurements: Goal: Will remain free from infection Outcome: Progressing   Problem: Activity: Goal: Risk for activity intolerance will decrease Outcome: Progressing   Problem: Pain Managment: Goal: General experience of comfort will improve and/or be controlled Outcome: Progressing

## 2023-10-28 NOTE — Progress Notes (Signed)
 Mobility Specialist Progress Note:   10/28/23 1607  Mobility  Activity Ambulated with assistance in hallway  Level of Assistance Standby assist, set-up cues, supervision of patient - no hands on  Assistive Device None  Distance Ambulated (ft) 550 ft  Activity Response Tolerated well  Mobility Referral Yes  Mobility visit 1 Mobility  Mobility Specialist Start Time (ACUTE ONLY) 1500  Mobility Specialist Stop Time (ACUTE ONLY) 1508  Mobility Specialist Time Calculation (min) (ACUTE ONLY) 8 min   Pt received in chair, eager to mobility. Pt displayed improved gait speed w/o RW. No unsteadiness or LOB present during session, asx throughout. Pt returned to chair with call bell in reach and all needs met.   Leory Plowman  Mobility Specialist Please contact via [employer] office at [phone]

## 2023-10-28 NOTE — Progress Notes (Signed)
 Mobility Specialist Progress Note:   10/28/23 1147  Mobility  Activity Ambulated with assistance in hallway  Level of Assistance  (MinG)  Assistive Device Front wheel walker  Distance Ambulated (ft) 275 ft  Activity Response Tolerated well  Mobility Referral Yes  Mobility visit 1 Mobility  Mobility Specialist Start Time (ACUTE ONLY) 1020  Mobility Specialist Stop Time (ACUTE ONLY) 1035  Mobility Specialist Time Calculation (min) (ACUTE ONLY) 15 min   Pt received in bed, eager to mobility. SB to stand. MinG during ambulation. C/o slight lightheadedness during ambulation, otherwise asx throughout. Pt left in chair with call bell in reach and all needs met.   Leory Plowman  Mobility Specialist Please contact via [employer] office at [phone]

## 2023-10-28 NOTE — Progress Notes (Addendum)
 1 Day Post-Op  Subjective: CC: Patient reports their abdominal pain is improved compared to pre-op with resolution of RLQ pain. Pain is currently located around his incisions and is well controlled with PO medications. They are tolerating reg diet without n/v. Passing flatus, no BM. Mobilizing in the room with assistive device and staff. voiding without issues  Afebrile. No tachycardia or hypotension. WBC 9.3. Na 127. K, phos and mag have normalized.   He wants something for a fever blister/cold sore.   Objective: Vital signs in last 24 hours: Temp:  [97.6 F (36.4 C)-98.8 F (37.1 C)] 97.7 F (36.5 C) (03/07 0444) Pulse Rate:  [62-86] 62 (03/07 0444) Resp:  [13-20] 16 (03/07 0444) BP: (97-123)/(57-84) 114/67 (03/07 0444) SpO2:  [92 %-100 %] 97 % (03/07 0444) Weight:  [92.1 kg] 92.1 kg (03/06 1126) Last BM Date : 10/26/23  Intake/Output from previous day: 03/06 0701 - 03/07 0700 In: 1080.5 [I.V.:980; IV Piggyback:100.5] Out: 303 [Urine:300; Blood:3] Intake/Output this shift: No intake/output data recorded.  PE: Gen:  Alert, NAD, pleasant HEENT: Very small area on the inner lip that is raised, non-fluid filled. No MM sloughing. No tongue or lip swelling.  Midline uvula without edema. The patient has normal phonation and is in control of secretions. Card:  RRR Pulm:  CTAB, no W/R/R, effort normal Abd: Soft, mild distension, appropriately tender around laparoscopic incisions, no rigidity or guarding and otherwise NT, +BS. Incisions with glue intact appears well and are without drainage, bleeding, or signs of infection.  Ext:  No LE edema, SCDs in place.  Skin: no obvious rash Psych: A&Ox3   Lab Results:  Recent Labs    10/27/23 0444 10/28/23 0618  WBC 5.2 9.3  HGB 15.1 14.0  HCT 41.9 40.3  PLT 166 152   BMET Recent Labs    10/27/23 0859 10/28/23 0618  NA 127* 127*  K 3.1* 4.3  CL 96* 99  CO2 22 25  GLUCOSE 131* 135*  BUN 15 17  CREATININE 0.87 0.81   CALCIUM 7.9* 7.7*   PT/INR Recent Labs    10/27/23 0444  LABPROT 15.0  INR 1.2   CMP     Component Value Date/Time   NA 127 (L) 10/28/2023 0618   NA 133 (L) 05/13/2020 0850   K 4.3 10/28/2023 0618   CL 99 10/28/2023 0618   CO2 25 10/28/2023 0618   GLUCOSE 135 (H) 10/28/2023 0618   BUN 17 10/28/2023 0618   BUN 11 05/13/2020 0850   CREATININE 0.81 10/28/2023 0618   CREATININE 0.75 05/06/2020 1053   CALCIUM 7.7 (L) 10/28/2023 0618   PROT 5.2 (L) 10/27/2023 0859   PROT 6.5 03/14/2019 1004   ALBUMIN 2.7 (L) 10/27/2023 0859   ALBUMIN 4.2 03/14/2019 1004   AST 21 10/27/2023 0859   ALT 14 10/27/2023 0859   ALKPHOS 43 10/27/2023 0859   BILITOT 1.9 (H) 10/27/2023 0859   BILITOT 0.6 03/14/2019 1004   GFRNONAA >60 10/28/2023 0618   GFRNONAA 85 05/06/2020 1053   GFRAA 93 05/13/2020 0850   GFRAA 99 05/06/2020 1053   Lipase     Component Value Date/Time   LIPASE 18.0 10/10/2018 1209    Studies/Results: CT CHEST ABDOMEN PELVIS W CONTRAST Result Date: 10/27/2023 CLINICAL DATA:  88 year old male with sepsis. Fever. Right lower quadrant pain onset last night. Prostate cancer. EXAM: CT CHEST, ABDOMEN, AND PELVIS WITH CONTRAST TECHNIQUE: Multidetector CT imaging of the chest, abdomen and pelvis was performed following the standard protocol  during bolus administration of intravenous contrast. RADIATION DOSE REDUCTION: This exam was performed according to the departmental dose-optimization program which includes automated exposure control, adjustment of the mA and/or kV according to patient size and/or use of iterative reconstruction technique. CONTRAST:  75mL OMNIPAQUE IOHEXOL 350 MG/ML SOLN COMPARISON:  PET-CT 08/08/2023.  Noncontrast chest CT 02/28/2017. FINDINGS: CT CHEST FINDINGS Cardiovascular: Calcified aortic atherosclerosis. Calcified coronary artery atherosclerosis. Heart size remains normal. No pericardial effusion. Major mediastinal vascular structures appear to be normally  enhancing. Mediastinum/Nodes: Negative for mediastinal mass or lymphadenopathy. Stable subcentimeter lymph nodes since 2018. Lungs/Pleura: Chronic and progressed since 2018 subpleural lung scarring and early fibrosis bilaterally. Major airways are patent. No pleural effusion. No consolidation. No discrete pulmonary metastasis. No convincing lung inflammation at this time. Musculoskeletal: No acute or suspicious osseous lesion identified in the chest. CT ABDOMEN PELVIS FINDINGS Hepatobiliary: Negative liver and gallbladder. Pancreas: Partially atrophied. Spleen: Negative. Adrenals/Urinary Tract: Negative adrenal glands. Kidneys appears stable and negative, symmetric renal enhancement and contrast excretion. Decompressed ureters. Stable and negative urinary bladder. Stomach/Bowel: Large bowel redundancy, retained gas and stool. Mild wall thickening of the rectum, not significantly changed from the descend burr PET-CT diverticulosis in the sigmoid and descending colon with no active inflammation identified. Cecum on a lax mesentery located in the anterior right upper quadrant, gas distended. Dilated and inflamed appendix arising from the anteriorly situated cecum on series 3, image 85. Appendix: Location: Right abdomen, supraumbilical, tracking posteriorly from anterior cecum on a lax mesentery. Diameter: Dilated, up to 14 mm Appendicolith: None identified Mucosal hyper-enhancement: Positive Extraluminal gas: Negative Periappendiceal collection: Confluent para appendiceal mesenteric stranding. No discrete free fluid or fluid collection. Gas and fluid containing but nondilated small bowel in the abdomen. No pneumoperitoneum identified. Small volume oral contrast in the stomach. Duodenum decompressed. Vascular/Lymphatic: Aortoiliac calcified atherosclerosis. Normal caliber abdominal aorta. Major arterial structures are tortuous, patent. Portal venous system appears patent. No lymphadenopathy. Reproductive: Prostatomegaly  with multiple probable biopsy clips within the prostate. Chronic fat containing left inguinal hernia is stable. Other: No pelvis free fluid. Musculoskeletal: Chronic lumbar spine degeneration including spondylolisthesis with advanced facet arthropathy. No acute or suspicious osseous lesion identified. IMPRESSION: 1. Acute Appendicitis. Dilated and inflamed appendix located in the right mid abdomen directed posteriorly from anterior right upper quadrant located cecum which is on a lax mesentery. No evidence of perforation or abscess. 2. No other acute or inflammatory process identified in the chest, abdomen, or pelvis. Progressive bilateral lung scarring with early pulmonary fibrosis since 2018. Aortic Atherosclerosis (ICD10-I70.0). Electronically Signed   By: Odessa Fleming M.D.   On: 10/27/2023 07:32   DG Chest Port 1 View Result Date: 10/27/2023 CLINICAL DATA:  Questionable sepsis. EXAM: PORTABLE CHEST 1 VIEW COMPARISON:  PA and lateral chest 08/20/2023 FINDINGS: Interval new mild cardiomegaly.  No vascular congestion is seen. There are mild chronic interstitial changes in the lung bases exaggerated by low inspiration. No focal pneumonia is evident. The mediastinum is stable with mild aortic atherosclerosis and uncoiling. No new osseous findings.  Osteopenia and thoracic spondylosis. IMPRESSION: 1. Interval new mild cardiomegaly. No vascular congestion. 2. Mild chronic interstitial changes in the lung bases exaggerated by low inspiration. No focal pneumonia is evident. Electronically Signed   By: Almira Bar M.D.   On: 10/27/2023 06:05    Anti-infectives: Anti-infectives (From admission, onward)    Start     Dose/Rate Route Frequency Ordered Stop   10/28/23 1600  cefTRIAXone (ROCEPHIN) 2 g in sodium chloride 0.9 %  100 mL IVPB        2 g 200 mL/hr over 30 Minutes Intravenous Every 24 hours 10/27/23 2045     10/27/23 1607  cefTRIAXone (ROCEPHIN) 2 g in sodium chloride 0.9 % 100 mL IVPB       Placed in "And"  Linked Group   2 g 200 mL/hr over 30 Minutes Intravenous Every 24 hours 10/27/23 1608 10/27/23 1743   10/27/23 1607  metroNIDAZOLE (FLAGYL) IVPB 500 mg       Placed in "And" Linked Group   500 mg 100 mL/hr over 60 Minutes Intravenous Every 12 hours 10/27/23 1608 10/28/23 0658   10/27/23 0515  vancomycin (VANCOREADY) IVPB 2000 mg/400 mL        2,000 mg 200 mL/hr over 120 Minutes Intravenous  Once 10/27/23 0507 10/27/23 0802   10/27/23 0515  ceFEPIme (MAXIPIME) 2 g in sodium chloride 0.9 % 100 mL IVPB        2 g 200 mL/hr over 30 Minutes Intravenous  Once 10/27/23 0507 10/27/23 0543        Assessment/Plan POD 1 s/p laparoscopic appendectomy by Dr. Dwain Sarna on 10/27/23 for acute appendicitis - Bcx + for E. Coli. Restarted abx. Repeat Bcx - Cont reg diet - Mobilize - Pulm toilet  FEN - Reg VTE - SCDs, Lovenox ID - Cefepime/Vanc pre-op. Rocephin/Flagyl x 24 hours post op. Restarted Rocephin 3/7 for +E. Coli Bcx.  Foley - None, spont void.   Hyponatremia - Na 127 and stable. Asymptomatic. Appears he has chronically low sodium that usually ranges between 126 - 133 on prior labs. He is on hydrochlorothiazide at baseline. BP is wnl today. Can hold hydrochlorothiazide today and repeat BMP in AM. He does also report chronic etoh use. Will review with MD. Monitor. If does not improve may need further w/u.  HTN - home amlodipine. PRN meds.  HLD OSA on CPAP CAD  Etoh use - CIWA Fever blister/cold sore - patient reports hx of this and asking if he can have something for this. Will discuss w/ pharmacy what we have available here for this   LOS: 0 days    Jacinto Halim, [facility] 10/28/2023, 7:46 AM Please see Amion for pager number during day hours 7:00am-4:30pm

## 2023-10-29 LAB — BASIC METABOLIC PANEL
Anion gap: 5 (ref 5–15)
BUN: 16 mg/dL (ref 8–23)
CO2: 24 mmol/L (ref 22–32)
Calcium: 7.8 mg/dL — ABNORMAL LOW (ref 8.9–10.3)
Chloride: 101 mmol/L (ref 98–111)
Creatinine, Ser: 0.84 mg/dL (ref 0.61–1.24)
GFR, Estimated: >60 mL/min (ref >60)
Glucose, Bld: 103 mg/dL — ABNORMAL HIGH (ref 70–99)
Potassium: 4.2 mmol/L (ref 3.5–5.1)
Sodium: 130 mmol/L — ABNORMAL LOW (ref 135–145)

## 2023-10-29 LAB — CULTURE, BLOOD (ROUTINE X 2): Special Requests: ADEQUATE

## 2023-10-29 MED ORDER — DOCUSATE SODIUM 100 MG PO CAPS
100.0000 mg | ORAL_CAPSULE | Freq: Two times a day (BID) | ORAL | Status: DC
  Administered 2023-10-29 – 2023-10-31 (×5): 100 mg via ORAL
  Filled 2023-10-29 (×5): qty 1

## 2023-10-29 MED ORDER — POLYETHYLENE GLYCOL 3350 17 G PO PACK
17.0000 g | PACK | Freq: Every day | ORAL | Status: DC
  Filled 2023-10-29 (×2): qty 1

## 2023-10-29 MED ORDER — SIMETHICONE 80 MG PO CHEW
40.0000 mg | CHEWABLE_TABLET | Freq: Four times a day (QID) | ORAL | Status: DC
  Administered 2023-10-29 – 2023-10-31 (×9): 40 mg via ORAL
  Filled 2023-10-29 (×9): qty 1

## 2023-10-29 MED ORDER — MELATONIN 3 MG PO TABS: 3.0000 mg | ORAL_TABLET | Freq: Every evening | ORAL | Status: DC | PRN

## 2023-10-29 NOTE — Progress Notes (Signed)
 2 Days Post-Op  Subjective: CC: Patient reports soreness around his incisions that is well controlled. Having some bloating and burping/belching but tolerating soft diet without n/v. Passing flatus. No bm. Voiding without issues. Ambulating in halls without AD or assistance.   Didn't sleep well.   Afebrile. No tachycardia or hypotension.    Objective: Vital signs in last 24 hours: Temp:  [97.6 F (36.4 C)-98.4 F (36.9 C)] 97.8 F (36.6 C) (03/08 0743) Pulse Rate:  [49-60] 56 (03/08 0743) Resp:  [16-18] 18 (03/08 0743) BP: (112-141)/(61-74) 129/74 (03/08 0743) SpO2:  [97 %-100 %] 99 % (03/08 0743) Last BM Date : 10/26/23  Intake/Output from previous day: 03/07 0701 - 03/08 0700 In: 480 [P.O.:480] Out: -  Intake/Output this shift: No intake/output data recorded.  PE: Gen:  Alert, NAD, pleasant Card:  Reg  Pulm:  CTAB, no W/R/R, effort normal Abd: Soft, at least mild distension, appropriately tender around laparoscopic incisions, no rigidity or guarding and otherwise NT, +BS. Incisions with steri-strips intact appears well and are without drainage, bleeding, or signs of infection.  Ext:  No LE edema Psych: A&Ox3   Lab Results:  Recent Labs    10/27/23 0444 10/28/23 0618  WBC 5.2 9.3  HGB 15.1 14.0  HCT 41.9 40.3  PLT 166 152   BMET Recent Labs    10/28/23 0618 10/29/23 0650  NA 127* 130*  K 4.3 4.2  CL 99 101  CO2 25 24  GLUCOSE 135* 103*  BUN 17 16  CREATININE 0.81 0.84  CALCIUM 7.7* 7.8*   PT/INR Recent Labs    10/27/23 0444  LABPROT 15.0  INR 1.2   CMP     Component Value Date/Time   NA 130 (L) 10/29/2023 0650   NA 133 (L) 05/13/2020 0850   K 4.2 10/29/2023 0650   CL 101 10/29/2023 0650   CO2 24 10/29/2023 0650   GLUCOSE 103 (H) 10/29/2023 0650   BUN 16 10/29/2023 0650   BUN 11 05/13/2020 0850   CREATININE 0.84 10/29/2023 0650   CREATININE 0.75 05/06/2020 1053   CALCIUM 7.8 (L) 10/29/2023 0650   PROT 5.2 (L) 10/27/2023 0859    PROT 6.5 03/14/2019 1004   ALBUMIN 2.7 (L) 10/27/2023 0859   ALBUMIN 4.2 03/14/2019 1004   AST 21 10/27/2023 0859   ALT 14 10/27/2023 0859   ALKPHOS 43 10/27/2023 0859   BILITOT 1.9 (H) 10/27/2023 0859   BILITOT 0.6 03/14/2019 1004   GFRNONAA >60 10/29/2023 0650   GFRNONAA 85 05/06/2020 1053   GFRAA 93 05/13/2020 0850   GFRAA 99 05/06/2020 1053   Lipase     Component Value Date/Time   LIPASE 18.0 10/10/2018 1209    Studies/Results: No results found.   Anti-infectives: Anti-infectives (From admission, onward)    Start     Dose/Rate Route Frequency Ordered Stop   10/28/23 1600  cefTRIAXone (ROCEPHIN) 2 g in sodium chloride 0.9 % 100 mL IVPB        2 g 200 mL/hr over 30 Minutes Intravenous Every 24 hours 10/27/23 2045     10/27/23 1607  cefTRIAXone (ROCEPHIN) 2 g in sodium chloride 0.9 % 100 mL IVPB       Placed in "And" Linked Group   2 g 200 mL/hr over 30 Minutes Intravenous Every 24 hours 10/27/23 1608 10/27/23 1743   10/27/23 1607  metroNIDAZOLE (FLAGYL) IVPB 500 mg       Placed in "And" Linked Group   500 mg 100  mL/hr over 60 Minutes Intravenous Every 12 hours 10/27/23 1608 10/28/23 0715   10/27/23 0515  vancomycin (VANCOREADY) IVPB 2000 mg/400 mL        2,000 mg 200 mL/hr over 120 Minutes Intravenous  Once 10/27/23 0507 10/27/23 0802   10/27/23 0515  ceFEPIme (MAXIPIME) 2 g in sodium chloride 0.9 % 100 mL IVPB        2 g 200 mL/hr over 30 Minutes Intravenous  Once 10/27/23 0507 10/27/23 0543        Assessment/Plan POD 2 s/p laparoscopic appendectomy by Dr. Dwain Sarna on 10/27/23 for acute appendicitis - Bcx + for E. Coli. Restarted abx. Repeat Bcx NGTD - Cont reg diet as tolerated - Mobilize - Pulm toilet  FEN - Reg, scheduled simethicone, add bowel regimen VTE - SCDs, Lovenox ID - Cefepime/Vanc pre-op. Rocephin/Flagyl x 24 hours post op. Restarted Rocephin 3/7 for +E. Coli Bcx.  Foley - None, spont void.   Hyponatremia - Appears he has chronically low  sodium that usually ranges between 126 - 133 on prior labs. He is on hydrochlorothiazide at baseline. This was held and Na improved to 130 today.  HTN - home amlodipine. PRN meds. Monitor.  HLD OSA on CPAP CAD  Etoh use - CIWA   LOS: 1 day    Jacinto Halim, [facility] 10/29/2023, 9:01 AM Please see Amion for pager number during day hours 7:00am-4:30pm

## 2023-10-30 LAB — CULTURE, BLOOD (ROUTINE X 2): Special Requests: ADEQUATE

## 2023-10-30 LAB — BASIC METABOLIC PANEL
Anion gap: 9 (ref 5–15)
BUN: 11 mg/dL (ref 8–23)
CO2: 24 mmol/L (ref 22–32)
Calcium: 8.2 mg/dL — ABNORMAL LOW (ref 8.9–10.3)
Chloride: 100 mmol/L (ref 98–111)
Creatinine, Ser: 0.61 mg/dL (ref 0.61–1.24)
GFR, Estimated: >60 mL/min (ref >60)
Glucose, Bld: 94 mg/dL (ref 70–99)
Potassium: 3.8 mmol/L (ref 3.5–5.1)
Sodium: 133 mmol/L — ABNORMAL LOW (ref 135–145)

## 2023-10-30 MED ORDER — BISACODYL 10 MG RE SUPP: 10.0000 mg | Freq: Once | RECTAL | Status: DC

## 2023-10-30 NOTE — Progress Notes (Addendum)
 3 Days Post-Op  Subjective: CC: Reports no abdominal pain except incisional pain when he moves. No prn medications in the last 24 hours. Tolerating po without n/v. Passing flatus. No BM. Voiding. Mobilizing independently.   Sleeping better.   Afebrile. No tachycardia or hypotension.   Objective: Vital signs in last 24 hours: Temp:  [97.7 F (36.5 C)-97.9 F (36.6 C)] 97.7 F (36.5 C) (03/09 0624) Pulse Rate:  [54-62] 58 (03/09 0828) Resp:  [15-17] 16 (03/09 0828) BP: (114-152)/(65-85) 129/65 (03/09 0828) SpO2:  [96 %-100 %] 98 % (03/09 0828) Last BM Date : 10/26/23  Intake/Output from previous day: 03/08 0701 - 03/09 0700 In: 580 [P.O.:480; IV Piggyback:100] Out: -  Intake/Output this shift: No intake/output data recorded.  PE: Gen:  Alert, NAD, pleasant Card:  Reg  Pulm:  CTAB, no W/R/R, effort normal Abd: Soft, improved mild distension, appropriately tender around laparoscopic incisions, no rigidity or guarding and otherwise NT, +BS. Incisions with steri-strips intact appears well and are without drainage, bleeding, or signs of infection.  Ext:  No LE edema Psych: A&Ox3   Lab Results:  Recent Labs    10/28/23 0618  WBC 9.3  HGB 14.0  HCT 40.3  PLT 152   BMET Recent Labs    10/28/23 0618 10/29/23 0650  NA 127* 130*  K 4.3 4.2  CL 99 101  CO2 25 24  GLUCOSE 135* 103*  BUN 17 16  CREATININE 0.81 0.84  CALCIUM 7.7* 7.8*   PT/INR No results for input(s): "LABPROT", "INR" in the last 72 hours.  CMP     Component Value Date/Time   NA 130 (L) 10/29/2023 0650   NA 133 (L) 05/13/2020 0850   K 4.2 10/29/2023 0650   CL 101 10/29/2023 0650   CO2 24 10/29/2023 0650   GLUCOSE 103 (H) 10/29/2023 0650   BUN 16 10/29/2023 0650   BUN 11 05/13/2020 0850   CREATININE 0.84 10/29/2023 0650   CREATININE 0.75 05/06/2020 1053   CALCIUM 7.8 (L) 10/29/2023 0650   PROT 5.2 (L) 10/27/2023 0859   PROT 6.5 03/14/2019 1004   ALBUMIN 2.7 (L) 10/27/2023 0859    ALBUMIN 4.2 03/14/2019 1004   AST 21 10/27/2023 0859   ALT 14 10/27/2023 0859   ALKPHOS 43 10/27/2023 0859   BILITOT 1.9 (H) 10/27/2023 0859   BILITOT 0.6 03/14/2019 1004   GFRNONAA >60 10/29/2023 0650   GFRNONAA 85 05/06/2020 1053   GFRAA 93 05/13/2020 0850   GFRAA 99 05/06/2020 1053   Lipase     Component Value Date/Time   LIPASE 18.0 10/10/2018 1209    Studies/Results: No results found.   Anti-infectives: Anti-infectives (From admission, onward)    Start     Dose/Rate Route Frequency Ordered Stop   10/28/23 1600  cefTRIAXone (ROCEPHIN) 2 g in sodium chloride 0.9 % 100 mL IVPB        2 g 200 mL/hr over 30 Minutes Intravenous Every 24 hours 10/27/23 2045     10/27/23 1607  cefTRIAXone (ROCEPHIN) 2 g in sodium chloride 0.9 % 100 mL IVPB       Placed in "And" Linked Group   2 g 200 mL/hr over 30 Minutes Intravenous Every 24 hours 10/27/23 1608 10/27/23 1743   10/27/23 1607  metroNIDAZOLE (FLAGYL) IVPB 500 mg       Placed in "And" Linked Group   500 mg 100 mL/hr over 60 Minutes Intravenous Every 12 hours 10/27/23 1608 10/28/23 0715   10/27/23 0515  vancomycin (VANCOREADY) IVPB 2000 mg/400 mL        2,000 mg 200 mL/hr over 120 Minutes Intravenous  Once 10/27/23 0507 10/27/23 0802   10/27/23 0515  ceFEPIme (MAXIPIME) 2 g in sodium chloride 0.9 % 100 mL IVPB        2 g 200 mL/hr over 30 Minutes Intravenous  Once 10/27/23 0507 10/27/23 0543        Assessment/Plan POD 3 s/p laparoscopic appendectomy by Dr. Dwain Sarna on 10/27/23 for acute appendicitis - Bcx + for E. Coli. Restarted abx. Repeat Bcx NGTD at 48 hrs (called micro to verify) - Cont reg diet as tolerated - Mobilize - Pulm toilet - Plan for d/c today if there is a good oral option for abx for E. Coli bacteremia. I am checking with pharmacy for recommendation due to resistance on cx.   Update - spoke with pharmacy and ID. No oral options. Recommends 7d total of abx. Will keep here to complete abx per ID  recommendations.   FEN - Reg, bowel regimen, suppository VTE - SCDs, Lovenox ID - Cefepime/Vanc pre-op. Rocephin/Flagyl x 24 hours post op. Restarted Rocephin 3/7 for +E. Coli Bcx.  Foley - None, spont void.   Hyponatremia - Appears he has chronically low sodium that usually ranges between 126 - 133 on prior labs. He is on hydrochlorothiazide at baseline. This was held and Na improved to 130 on yesterday's labs. BMP pending. Recommend pcp f/u.  HTN - home amlodipine. PRN meds. Monitor.  HLD OSA on CPAP CAD  Etoh use - CIWA   LOS: 2 days    Jacinto Halim, [facility] 10/30/2023, 8:54 AM Please see Amion for pager number during day hours 7:00am-4:30pm

## 2023-10-30 NOTE — Plan of Care (Signed)
  Problem: Education: Goal: Knowledge of General Education information will improve Description: Including pain rating scale, medication(s)/side effects and non-pharmacologic comfort measures Outcome: Progressing   Problem: Clinical Measurements: Goal: Cardiovascular complication will be avoided Outcome: Progressing   Problem: Activity: Goal: Risk for activity intolerance will decrease Outcome: Progressing   Problem: Nutrition: Goal: Adequate nutrition will be maintained Outcome: Progressing   Problem: Pain Managment: Goal: General experience of comfort will improve and/or be controlled Outcome: Progressing   Problem: Safety: Goal: Ability to remain free from injury will improve Outcome: Progressing

## 2023-10-31 DIAGNOSIS — R7881 Bacteremia: Secondary | ICD-10-CM | POA: Insufficient documentation

## 2023-10-31 MED ORDER — ACETAMINOPHEN 500 MG PO TABS: 1000.0000 mg | ORAL_TABLET | Freq: Four times a day (QID) | ORAL | Status: DC | PRN

## 2023-10-31 MED ORDER — SODIUM CHLORIDE 0.9 % IV SOLN
2.0000 g | INTRAVENOUS | Status: DC
  Administered 2023-10-31: 2 g via INTRAVENOUS
  Filled 2023-10-31: qty 20

## 2023-10-31 MED ORDER — POLYETHYLENE GLYCOL 3350 17 G PO PACK
17.0000 g | PACK | Freq: Two times a day (BID) | ORAL | Status: DC
Start: 2023-10-31 — End: 2023-10-31
  Filled 2023-10-31: qty 1

## 2023-10-31 MED ORDER — CEFTRIAXONE IV (FOR PTA / DISCHARGE USE ONLY): 2.0000 g | INTRAVENOUS | 0 refills | Status: AC

## 2023-10-31 NOTE — Plan of Care (Signed)
  Problem: Education: Goal: Knowledge of General Education information will improve Description: Including pain rating scale, medication(s)/side effects and non-pharmacologic comfort measures Outcome: Progressing   Problem: Clinical Measurements: Goal: Will remain free from infection Outcome: Progressing   Problem: Activity: Goal: Risk for activity intolerance will decrease Outcome: Progressing   Problem: Nutrition: Goal: Adequate nutrition will be maintained Outcome: Progressing   Problem: Coping: Goal: Level of anxiety will decrease Outcome: Progressing   Problem: Pain Managment: Goal: General experience of comfort will improve and/or be controlled Outcome: Progressing   Problem: Safety: Goal: Ability to remain free from injury will improve Outcome: Progressing

## 2023-10-31 NOTE — Progress Notes (Signed)
 4 Days Post-Op  Subjective: Pt reports he is tolerating diet and had a BM yesterday. He is willing to get himself to an infusion center for remaining antibiotics if he can go home today. Minimal abdominal soreness.   Objective: Vital signs in last 24 hours: Temp:  [97.5 F (36.4 C)-98.6 F (37 C)] 97.5 F (36.4 C) (03/10 0448) Pulse Rate:  [57-73] 57 (03/10 0448) Resp:  [16-17] 16 (03/10 0448) BP: (127-138)/(65-77) 138/77 (03/10 0448) SpO2:  [97 %-98 %] 97 % (03/10 0448) Last BM Date : 10/30/23  Intake/Output from previous day: No intake/output data recorded. Intake/Output this shift: No intake/output data recorded.  PE: Gen:  Alert, NAD, pleasant Pulm: effort normal Abd: Soft, mild distension, appropriately tender around laparoscopic incisions, no rigidity or guarding and otherwise NT. Incisions with steri-strips intact appears well and are without drainage, bleeding, or signs of infection.  Psych: A&Ox3   Lab Results:  No results for input(s): "WBC", "HGB", "HCT", "PLT" in the last 72 hours.  BMET Recent Labs    10/29/23 0650 10/30/23 0825  NA 130* 133*  K 4.2 3.8  CL 101 100  CO2 24 24  GLUCOSE 103* 94  BUN 16 11  CREATININE 0.84 0.61  CALCIUM 7.8* 8.2*   PT/INR No results for input(s): "LABPROT", "INR" in the last 72 hours.  CMP     Component Value Date/Time   NA 133 (L) 10/30/2023 0825   NA 133 (L) 05/13/2020 0850   K 3.8 10/30/2023 0825   CL 100 10/30/2023 0825   CO2 24 10/30/2023 0825   GLUCOSE 94 10/30/2023 0825   BUN 11 10/30/2023 0825   BUN 11 05/13/2020 0850   CREATININE 0.61 10/30/2023 0825   CREATININE 0.75 05/06/2020 1053   CALCIUM 8.2 (L) 10/30/2023 0825   PROT 5.2 (L) 10/27/2023 0859   PROT 6.5 03/14/2019 1004   ALBUMIN 2.7 (L) 10/27/2023 0859   ALBUMIN 4.2 03/14/2019 1004   AST 21 10/27/2023 0859   ALT 14 10/27/2023 0859   ALKPHOS 43 10/27/2023 0859   BILITOT 1.9 (H) 10/27/2023 0859   BILITOT 0.6 03/14/2019 1004   GFRNONAA  >60 10/30/2023 0825   GFRNONAA 85 05/06/2020 1053   GFRAA 93 05/13/2020 0850   GFRAA 99 05/06/2020 1053   Lipase     Component Value Date/Time   LIPASE 18.0 10/10/2018 1209    Studies/Results: No results found.   Anti-infectives: Anti-infectives (From admission, onward)    Start     Dose/Rate Route Frequency Ordered Stop   10/28/23 1600  cefTRIAXone (ROCEPHIN) 2 g in sodium chloride 0.9 % 100 mL IVPB        2 g 200 mL/hr over 30 Minutes Intravenous Every 24 hours 10/27/23 2045     10/27/23 1607  cefTRIAXone (ROCEPHIN) 2 g in sodium chloride 0.9 % 100 mL IVPB       Placed in "And" Linked Group   2 g 200 mL/hr over 30 Minutes Intravenous Every 24 hours 10/27/23 1608 10/27/23 1743   10/27/23 1607  metroNIDAZOLE (FLAGYL) IVPB 500 mg       Placed in "And" Linked Group   500 mg 100 mL/hr over 60 Minutes Intravenous Every 12 hours 10/27/23 1608 10/28/23 0715   10/27/23 0515  vancomycin (VANCOREADY) IVPB 2000 mg/400 mL        2,000 mg 200 mL/hr over 120 Minutes Intravenous  Once 10/27/23 0507 10/27/23 0802   10/27/23 0515  ceFEPIme (MAXIPIME) 2 g in sodium chloride  0.9 % 100 mL IVPB        2 g 200 mL/hr over 30 Minutes Intravenous  Once 10/27/23 0507 10/27/23 0543        Assessment/Plan POD4 s/p laparoscopic appendectomy by Dr. Dwain Sarna on 10/27/23 for acute appendicitis - Bcx + for E. Coli. Restarted abx. Repeat Bcx NGTD at 48 hrs (called micro to verify) - Cont reg diet as tolerated - Mobilize - Pulm toilet - discussing with case management - today will be dose 4/7 of IV rocephin. Can DC home if we can get set up for outpatient antibiotics for the next 3 doses  FEN - Reg, bowel regimen, suppository VTE - SCDs, Lovenox ID - Cefepime/Vanc pre-op. Rocephin/Flagyl x 24 hours post op. Restarted Rocephin 3/7 for +E. Coli Bcx.  Foley - None, spont void.   Hyponatremia - Appears he has chronically low sodium that usually ranges between 126 - 133 on prior labs. He is on  hydrochlorothiazide at baseline. This was held and Na improved to 133 yesterday. Recommend pcp f/u.  HTN - home amlodipine. PRN meds. Monitor.  HLD OSA on CPAP CAD  Etoh use - CIWA   LOS: 3 days    Juliet Rude, [facility] 10/31/2023, 8:08 AM Please see Amion for pager number during day hours 7:00am-4:30pm

## 2023-10-31 NOTE — TOC CM/SW Note (Signed)
 Received secure chat from CCS PA , plan patient will receive 4 og 7 IV rocephin doses today . CCS would like to discharge patient to home today with no PICC and have patient come to Infusion Clinic for last 3 doses .   NCM spoke to [place] with [facility]. Patient can go to Infusion Clinic starting tomorrow at 1100 am. He will need to check in at admitting [facility] and admitting will direct him to infusion clinic.   Infusion Clinic will need MD to enter orders in [facility]. Trixie Deis PA aware.   Infusion Clinic will start a peripheral IV daily

## 2023-10-31 NOTE — Discharge Summary (Signed)
 [facility] Discharge Summary   Patient ID: Jason Compton MRN: [id number] DOB/AGE: [birth year] 86 y.o.  Admit date: 10/27/2023 Discharge date: 10/31/2023  Admitting Diagnosis: Acute appendicitis   Discharge Diagnosis Acute appendicitis  E. Coli bacteremia   Consultants None   Imaging: No results found.  Procedures Dr. Emelia Loron (10/27/23) - Laparoscopic Appendectomy  Hospital Course:  Patient is an 88 year old male who presented to the ED with abdominal pain.  Workup showed acute appendicitis.  Patient was admitted and underwent procedure listed above.  Tolerated procedure well and was transferred to the floor.  Diet was advanced as tolerated.  On POD4, the patient was voiding well, tolerating diet, ambulating well, pain well controlled, vital signs stable, incisions c/d/i and felt stable for discharge home.  Patient will follow up in our office in 3 weeks and knows to call with questions or concerns.  He will call to confirm appointment date/time.    Patient found to have E.coli bacteremia during admission as well. He completed 5/7 days IV antibiotics. Outpatient infusion arranged for remaining 2 doses.    I or a member of my team have reviewed this patient in the Controlled Substance Database.   Allergies as of 10/31/2023       Reactions   Altace [ramipril]    Doesn't recall reaction   Lipitor [atorvastatin] Other (See Comments)   Joint pain, myalgias   Rosuvastatin Other (See Comments)   Myalgias        Medication List     PAUSE taking these medications    hydrochlorothiazide 25 MG tablet Wait to take this until your doctor or other care provider tells you to start again. Commonly known as: HYDRODIURIL TAKE 1 TABLET BY MOUTH DAILY       TAKE these medications    acetaminophen 500 MG tablet Commonly known as: TYLENOL Take 2 tablets (1,000 mg total) by mouth every 6 (six) hours as needed for mild pain (pain score 1-3) or fever.    amLODipine 5 MG tablet Commonly known as: NORVASC TAKE 1 TABLET BY MOUTH DAILY   ascorbic acid 500 MG tablet Commonly known as: VITAMIN C Take 500 mg by mouth daily.   cefTRIAXone IVPB Commonly known as: ROCEPHIN Inject 2 g into the vein daily for 1 day. Indication:  Bacteremia  First Dose: No Last Day of Therapy:  11/02/23 Labs - Once weekly:  CBC/D and BMP, Labs - Once weekly: ESR and CRP Method of administration: IV Push Method of administration may be changed at the discretion of home infusion pharmacist based upon assessment of the patient and/or caregiver's ability to self-administer the medication ordered.   chlorpheniramine-HYDROcodone 10-8 MG/5ML Commonly known as: TUSSIONEX Take 5 mLs by mouth every 12 (twelve) hours as needed for cough.   cholecalciferol 1000 units tablet Commonly known as: VITAMIN D Take 1,000 Units by mouth daily.   cycloSPORINE 0.05 % ophthalmic emulsion Commonly known as: RESTASIS Place 1 drop into both eyes 2 (two) times daily.   ezetimibe 10 MG tablet Commonly known as: ZETIA TAKE 1 TABLET BY MOUTH DAILY   famotidine 20 MG tablet Commonly known as: PEPCID Take 20 mg by mouth daily.   multivitamin tablet Take 1 tablet by mouth daily.   OVER THE COUNTER MEDICATION Take 2 tablets by mouth 2 (two) times daily. hydroEyes   Repatha SureClick 140 MG/ML Soaj Generic drug: Evolocumab ADMINISTER 1 ML UNDER THE SKIN EVERY 14 DAYS   tobramycin 0.3 % ophthalmic solution Commonly  known as: TOBREX Place 1 drop into the right eye 4 (four) times daily.               Home Infusion Instuctions  (From admission, onward)           Start     Ordered   10/31/23 0000  Home infusion instructions       Question Answer Comment  Instructions Other   Comments Go to infusion center for remaining 2 doses of antibiotic therapy after discharge      10/31/23 0928              Follow-up Information     Reine Just, PA-C Follow  up on 11/29/2023.   Specialty: General Surgery Why: 4/8 at 9:15. Please bring a copy of your photo ID, insurance card and arrive 30 minutes prior to your appointment for paperwork. Contact information: [address] [place] [address] [phone]         [facility] Follow up.   Why: Check in at [facility] with admitting, they will direct you to [facility]   Phone [phone] Contact information: [address]  [place]                Signed: Juliet Rude , Cordelia Poche [facility] 11/02/2023, 12:36 PM Please see Amion for pager number during day hours 7:00am-4:30pm

## 2023-10-31 NOTE — Care Management Important Message (Signed)
 Important Message  Patient Details  Name: Jason Compton MRN: [id number] Date of Birth: [birth year]   Important Message Given:  Yes - Medicare IM     Renie Ora 10/31/2023, 12:06 PM

## 2023-10-31 NOTE — Progress Notes (Signed)
 IV Antimicrobials Pharmacy Note  Patient Name: Jason Compton   FYI: Infusion orders placed in appointment encounter, starting tomorrow 11/01/23.   Please reach out if you have any questions.   Milford Cage, PharmD, BCPS Clinical Pharmacist, Infectious Diseases Available via secure chat

## 2023-11-01 ENCOUNTER — Telehealth: Payer: Self-pay | Admitting: *Deleted

## 2023-11-01 ENCOUNTER — Encounter (HOSPITAL_COMMUNITY)
Admission: RE | Admit: 2023-11-01 | Discharge: 2023-11-01 | Disposition: A | Discharge status: Home / Self Care | Source: Ambulatory Visit | Attending: General Surgery | Admitting: General Surgery

## 2023-11-01 VITALS — BP 152/85 | HR 65 | Temp 97.9°F | Resp 16 | Wt 203.0 lb

## 2023-11-01 DIAGNOSIS — R7881 Bacteremia: Secondary | ICD-10-CM | POA: Insufficient documentation

## 2023-11-01 MED ORDER — SODIUM CHLORIDE 0.9 % IV SOLN
2.0000 g | INTRAVENOUS | Status: DC
  Administered 2023-11-01: 2 g via INTRAVENOUS
  Filled 2023-11-01: qty 2

## 2023-11-01 NOTE — Transitions of Care (Post Inpatient/ED Visit) (Signed)
 11/01/2023  Name: Jason Compton MRN: [id number] DOB: 16-Nov-1935  Today's TOC FU Call Status: Today's TOC FU Call Status:: Successful TOC FU Call Completed TOC FU Call Complete Date: 11/01/23 Patient's Name and Date of Birth confirmed.  Transition Care Management Follow-up Telephone Call Date of Discharge: 10/31/23 Discharge Facility: [facility]) Type of Discharge: Inpatient Admission Primary Inpatient Discharge Diagnosis:: Acute appendicitis with lap-appendectomy How have you been since you were released from the hospital?: Better ("I am fine, went today to get the IV antibiotic treatment, have a few more visits there; other than that- I am back to normal.  I just sat down to eat lunch, I am not going over all of the medicines--- I have everything I need and understand what to do") Any questions or concerns?: No  Items Reviewed: Did you receive and understand the discharge instructions provided?: Yes (thoroughly reviewed with patient who verbalizes good understanding of same) Medications obtained,verified, and reconciled?: Partial Review Completed Reason for Partial Mediation Review: Patient declined: states he is "doing great;" and is able to confirm all medication changes post-hospital discharge without prompting Any new allergies since your discharge?: No Dietary orders reviewed?: Yes Type of Diet Ordered:: "Healthy as I can" Do you have support at home?: Yes People in Home: spouse Name of Support/Comfort Primary Source: Reports independent in self-care activities; supportive spouse assists as/ if needed/ indicated  Medications Reviewed Today: Medications Reviewed Today     Reviewed by Michaela Corner, RN (Registered Nurse) on 11/01/23 at 1310  Med List Status: <None>   Medication Order Taking? Sig Documenting Provider Last Dose Status Informant  acetaminophen (TYLENOL) 500 MG tablet [id number] Yes Take 2 tablets (1,000 mg total) by mouth every 6 (six) hours as needed for  mild pain (pain score 1-3) or fever. Juliet Rude, PA-C Taking Active   amLODipine (NORVASC) 5 MG tablet [id number]  TAKE 1 TABLET BY MOUTH DAILY Chilton Si, MD  Active Self, Spouse/Significant Other, Pharmacy Records  cefTRIAXone (ROCEPHIN) IVPB [id number] Yes Inject 2 g into the vein daily for 1 day. Indication:  Bacteremia  First Dose: No Last Day of Therapy:  11/02/23 Labs - Once weekly:  CBC/D and BMP, Labs - Once weekly: ESR and CRP Method of administration: IV Push Method of administration may be changed at the discretion of home infusion pharmacist based upon assessment of the patient and/or caregiver's ability to self-administer the medication ordered. Juliet Rude, PA-C Taking Active   chlorpheniramine-HYDROcodone (TUSSIONEX) 10-8 MG/5ML [id number]  Take 5 mLs by mouth every 12 (twelve) hours as needed for cough. Radford Pax, NP  Active Self, Spouse/Significant Other, Pharmacy Records  cholecalciferol (VITAMIN D) 1000 UNITS tablet [id number]  Take 1,000 Units by mouth daily. [provider]  Active Self, Spouse/Significant Other, Pharmacy Records  cycloSPORINE (RESTASIS) 0.05 % ophthalmic emulsion [id number]  Place 1 drop into both eyes 2 (two) times daily. [provider]  Active Self, Spouse/Significant Other, Pharmacy Records  Evolocumab [facility] SURECLICK) 140 MG/ML Ivory Broad [id number]  ADMINISTER 1 ML UNDER THE SKIN EVERY 14 DAYS Chilton Si, MD  Active Self, Spouse/Significant Other, Pharmacy Records  ezetimibe (ZETIA) 10 MG tablet [id number]  TAKE 1 TABLET BY MOUTH DAILY Chilton Si, MD  Active Self, Spouse/Significant Other, Pharmacy Records  famotidine (PEPCID) 20 MG tablet [id number]  Take 20 mg by mouth daily. [provider]  Active Self, Spouse/Significant Other, Pharmacy Records  hydrochlorothiazide (HYDRODIURIL) 25 MG tablet [id number]  TAKE 1 TABLET BY MOUTH DAILY Duke Salvia,  Elmarie Shiley, MD  Active Self, Spouse/Significant Other, Pharmacy  Records           Med Note Michaela Corner   Tue Nov 01, 2023  1:10 PM) 11/01/23: verified during [facility] call, patient understands this is currently on hold: verbalizes plans to discuss with cardiology provider re: when/ if to resume  Multiple Vitamin (MULTIVITAMIN) tablet [id number]  Take 1 tablet by mouth daily. [provider]  Active Self, Spouse/Significant Other, Pharmacy Records  OVER THE COUNTER MEDICATION [id number]  Take 2 tablets by mouth 2 (two) times daily. hydroEyes [provider]  Active Self, Spouse/Significant Other, Pharmacy Records  tobramycin (TOBREX) 0.3 % ophthalmic solution [id number]  Place 1 drop into the right eye 4 (four) times daily. [provider]  Active Self, Spouse/Significant Other, Pharmacy Records  vitamin C (ASCORBIC ACID) 500 MG tablet [id number]  Take 500 mg by mouth daily. [provider]  Active Self, Spouse/Significant Other, Pharmacy Records           Home Care and Equipment/Supplies: Were Home Health Services Ordered?: No Any new equipment or medical supplies ordered?: No  Functional Questionnaire: Do you need assistance with bathing/showering or dressing?: No Do you need assistance with meal preparation?: No Do you need assistance with eating?: No Do you have difficulty maintaining continence: No Do you need assistance with getting out of bed/getting out of a chair/moving?: No Do you have difficulty managing or taking your medications?: No  Follow up appointments reviewed: PCP Follow-up appointment confirmed?: NA (verified not indicated per hospital discharging provider discharge notes) Specialist Hospital Follow-up appointment confirmed?: Yes Date of Specialist follow-up appointment?: 11/29/23 (verified this is recommended time frame for follow up per hospital discharging provider notes) Follow-Up Specialty Provider:: surgical provider Do you need transportation to your follow-up appointment?: No Do you  understand care options if your condition(s) worsen?: Yes-patient verbalized understanding  SDOH Interventions Today    Flowsheet Row Most Recent Value  SDOH Interventions   Food Insecurity Interventions Intervention Not Indicated  Housing Interventions Patient Declined  [reports eating lunch, declines SDOH assessment]  Transportation Interventions Intervention Not Indicated  [reports drives self at baseline,  wife assisting post- recent hospital discharge on 10/31/23]  Utilities Interventions Patient Declined  [reports eating lunch, declines SDOH assessment]      Interventions Today    Flowsheet Row Most Recent Value  Chronic Disease   Chronic disease during today's visit Other  [Acute appendicitis with lap-appe]  General Interventions   General Interventions Discussed/Reviewed General Interventions Discussed, Durable Medical Equipment (DME), Doctor Visits  Doctor Visits Discussed/Reviewed Doctor Visits Discussed, Specialist  Durable Medical Equipment (DME) Other  [confirmed not currently requiring/ using assistive devices for ambulation]  PCP/Specialist Visits Compliance with follow-up visit  Education Interventions   Education Provided Provided Education  Provided Verbal Education On When to see the doctor  Nutrition Interventions   Nutrition Discussed/Reviewed Nutrition Discussed  Pharmacy Interventions   Pharmacy Dicussed/Reviewed Pharmacy Topics Discussed      TOC Interventions Today    Flowsheet Row Most Recent Value  TOC Interventions   TOC Interventions Discussed/Reviewed TOC Interventions Discussed  [Patient declines need for ongoing/ further care management/ coordination outreach,  declines enrollment in 30-day TOC program- declines taking my direct phone number should needs/ concerns arise post-TOC call]      Total time spent from review to signing of note/ including any care coordination interventions:  28 minutes  Pls call/ message for questions,  Caryl Pina, RN, BSN, CCRN  Alumnus RN Care Manager  Transitions of Care  [employer] - Population [facility]  [place] [phone]: direct office

## 2023-11-02 ENCOUNTER — Encounter (HOSPITAL_COMMUNITY): Payer: Self-pay

## 2023-11-02 ENCOUNTER — Telehealth (HOSPITAL_BASED_OUTPATIENT_CLINIC_OR_DEPARTMENT_OTHER): Payer: Self-pay | Admitting: Cardiovascular Disease

## 2023-11-02 ENCOUNTER — Encounter (HOSPITAL_COMMUNITY)
Admit: 2023-11-02 | Discharge: 2023-11-02 | Disposition: A | Discharge status: Home / Self Care | Attending: General Surgery | Admitting: General Surgery

## 2023-11-02 VITALS — BP 150/88 | HR 72 | Temp 97.8°F | Resp 16 | Wt 203.0 lb

## 2023-11-02 DIAGNOSIS — R7881 Bacteremia: Secondary | ICD-10-CM

## 2023-11-02 LAB — CULTURE, BLOOD (ROUTINE X 2)
Culture: NO GROWTH
Culture: NO GROWTH

## 2023-11-02 MED ORDER — SODIUM CHLORIDE 0.9 % IV SOLN
2.0000 g | INTRAVENOUS | Status: DC
  Filled 2023-11-02: qty 20

## 2023-11-02 MED ORDER — SODIUM CHLORIDE 0.9 % IV SOLN
1.0000 g | Freq: Once | INTRAVENOUS | Status: AC
  Administered 2023-11-02: 1 g via INTRAVENOUS
  Filled 2023-11-02: qty 1000

## 2023-11-02 NOTE — Progress Notes (Signed)
 Pt in for his final rocephin dose of a 7 day coarse.  Pt states he noticed a rash on his abdomen yesterday. The rash is very red and raised and covers his entire abdomen.  Pt denies burning or itching or fevers.  Dr Sophronia Simas and Pharmacists, Celedonio Miyamoto and [name] notified and discussed treatment plan going forward.  Rocephin listed as an allergy and Antibiotic changed for today ie his last dose in his 7 day coarse.  All explained to pt and wife who verbalize understanding.

## 2023-11-02 NOTE — Telephone Encounter (Signed)
 Wife Elnita Maxwell) stated she was returning RN Melinda's call.

## 2023-11-02 NOTE — Progress Notes (Addendum)
 Pt tolerated med well without increase in rash or any side effects. Discharged after completion of med

## 2023-11-03 ENCOUNTER — Encounter (HOSPITAL_COMMUNITY)

## 2023-11-03 NOTE — Telephone Encounter (Signed)
 Left message to call back

## 2023-11-04 ENCOUNTER — Encounter (HOSPITAL_BASED_OUTPATIENT_CLINIC_OR_DEPARTMENT_OTHER): Payer: Self-pay | Admitting: Cardiovascular Disease

## 2023-11-04 NOTE — Telephone Encounter (Signed)
 Spoke with patient 3/04 via [employer]

## 2023-11-04 NOTE — Telephone Encounter (Signed)
 Please review and advise.

## 2023-11-07 NOTE — Telephone Encounter (Signed)
 Pt due for follow up with Dr. Duke Salvia or Gillian Shields, NP

## 2023-11-17 ENCOUNTER — Ambulatory Visit (HOSPITAL_BASED_OUTPATIENT_CLINIC_OR_DEPARTMENT_OTHER): Admitting: Cardiovascular Disease

## 2023-11-17 ENCOUNTER — Encounter (HOSPITAL_BASED_OUTPATIENT_CLINIC_OR_DEPARTMENT_OTHER): Payer: Self-pay | Admitting: Cardiovascular Disease

## 2023-11-17 VITALS — BP 120/84 | HR 61 | Ht 68.0 in | Wt 204.2 lb

## 2023-11-17 DIAGNOSIS — I10 Essential (primary) hypertension: Secondary | ICD-10-CM | POA: Diagnosis not present

## 2023-11-17 DIAGNOSIS — E7800 Pure hypercholesterolemia, unspecified: Secondary | ICD-10-CM | POA: Diagnosis not present

## 2023-11-17 DIAGNOSIS — I2510 Atherosclerotic heart disease of native coronary artery without angina pectoris: Secondary | ICD-10-CM | POA: Diagnosis not present

## 2023-11-17 DIAGNOSIS — G4733 Obstructive sleep apnea (adult) (pediatric): Secondary | ICD-10-CM | POA: Diagnosis not present

## 2023-11-17 DIAGNOSIS — I700 Atherosclerosis of aorta: Secondary | ICD-10-CM | POA: Diagnosis not present

## 2023-11-17 MED ORDER — AMLODIPINE BESYLATE 10 MG PO TABS: 10.0000 mg | ORAL_TABLET | Freq: Every day | ORAL | 3 refills | Status: DC

## 2023-11-17 NOTE — Progress Notes (Signed)
 Cardiology Office Note:  .   Date:  11/17/2023  ID:  Jason Compton, DOB October 03, 1935, MRN [id number] PCP: Pincus Sanes, MD  [facility] Providers Cardiologist:  Chilton Si, MD Sleep Medicine:  Nicki Guadalajara, MD    History of Present Illness: .    Jason Compton is a 88 y.o. male with non-obstructive CAD, hypertension, hyperlipidemia, OSA on CPAP, prostate cancer, and Celiac disease who presents for follow up.  Jason Compton had myalgias on atorvastatin 10.  He has not been managed on Zetia and Repatha.  He reported increasing exertional dyspnea.  He had a Lexiscan Myoview 05/2019 that was negative for ischemia.  At his visit 04/2023 he was doing well.  He did note increased daytime somnolence and his wife felt that he seemed more fatigued than usual.  He was using his CPAP regularly.  Blood pressure was initially elevated but better on repeat.  He was admitted 10/2023 with acute appendicitis.  He also had E. coli bacteremia.  He underwent laparoscopic appendectomy 10/27/2023.   History of Present Illness Jason Compton is an 88 year old male with hypertension who presents for follow-up after recent hospitalization for appendicitis and blood infection.  Hypertension management has been challenging since the recent hospitalization. Blood pressure readings at home have been variable, ranging from 136 to 150 mmHg. He discontinued hydrochlorothiazide due to low sodium levels identified during his hospital stay. Currently, he is on amlodipine 5 mg, which he has been taking consistently, although he recalls being on a higher dose in the past. He has not been consistent with daily blood pressure monitoring but notes that his readings are generally higher than desired.  He recently underwent an appendectomy approximately three weeks ago after a rapid diagnosis within 24 hours. During the hospitalization, he developed an E. coli blood infection, which required an extended hospital stay and  antibiotic treatment. He received multiple infusions of antibiotics, initially in the hospital and later at an infusion center. A rash developed during the antibiotic treatment, suspected to be related to ceftriaxone. The rash was red, raised, and non-itchy, resolving after discontinuation of the antibiotic and application of cortisone cream.  He reports feeling generally fine but experiences some fatigue, which he attributes to his recent illness. A 30-minute nap in the afternoon is beneficial. He has resumed eating normally with no issues with appetite. He has resumed light physical activity, walking about half a mile daily without experiencing shortness of breath or other symptoms. He continues to use Repatha for cholesterol management, which he finds easy to use and without adverse effects. No shortness of breath during physical activity.   ROS:  As per HPI  Studies Reviewed: .       N/a  Risk Assessment/Calculations:             Physical Exam:   VS:  BP 120/84   Pulse 61   Ht 5\' 8"  (1.727 m)   Wt 204 lb 3.2 oz (92.6 kg)   SpO2 96%   BMI 31.05 kg/m  , BMI Body mass index is 31.05 kg/m. GENERAL:  Well appearing HEENT: Pupils equal round and reactive, fundi not visualized, oral mucosa unremarkable NECK:  No jugular venous distention, waveform within normal limits, carotid upstroke brisk and symmetric, no bruits, no thyromegaly LUNGS:  Clear to auscultation bilaterally HEART:  RRR.  PMI not displaced or sustained,S1 and S2 within normal limits, no S3, no S4, no clicks, no rubs, no murmurs ABD:  Flat,  positive bowel sounds normal in frequency in pitch, no bruits, no rebound, no guarding, no midline pulsatile mass, no hepatomegaly, no splenomegaly EXT:  2 plus pulses throughout, no edema, no cyanosis no clubbing SKIN:  No rashes no nodules NEURO:  Cranial nerves II through XII grossly intact, motor grossly intact throughout PSYCH:  Cognitively intact, oriented to person place and  time   ASSESSMENT AND PLAN: .    Assessment & Plan # Hypertension Hypertension management complicated by hyponatremia-induced discontinuation of hydrochlorothiazide. Blood pressure averages in the 140s at home though it was controlled in the office today. Considered increasing amlodipine to 10 mg to avoid electrolyte disturbances. - Increase amlodipine to 10 mg daily. - Monitor blood pressure at home regularly. - Follow up with the pharmacist in a couple of months to ensure blood pressure stabilization.  Consider adding ARB if needed.  - Report any edema or adverse effects from increased amlodipine dosage.  # Non-obstructive CAD:  # Hyperlipidemia Continues Repatha and Zetia with no adverse reactions. Medication is well-tolerated despite cost.  # E. coli bacteremia secondary to appendicitis Resolved E. coli bacteremia post-appendicitis with antibiotic treatment.  # Rash secondary to antibiotic use Rash likely due to ceftriaxone resolved after discontinuation and cortisone cream application.  # Prostate cancer No evidence of disease on recent PET scan. On six-month checkups.  Follow-up Discussed follow-up plans for hypertension and other conditions. - Schedule follow-up with the pharmacist in a couple of months. - Contact provider if issues arise with blood pressure management or medication side effects.   Dispo: f/u with PharmD in 2 months.  Kaulin Chaves C. Duke Salvia, MD, [facility] in 1 year.  Signed, Chilton Si, MD

## 2023-11-17 NOTE — Patient Instructions (Signed)
 Medication Instructions:  INCREASE YOUR AMLODIPINE TO 10 MG DAILY   *If you need a refill on your cardiac medications before your next appointment, please call your pharmacy*  Lab Work: NONE   Testing/Procedures: NONE  Follow-Up: At [facility], you and your health needs are our priority.  As part of our continuing mission to provide you with exceptional heart care, we have created designated Provider Care Teams.  These Care Teams include your primary Cardiologist (physician) and Advanced Practice Providers (APPs -  Physician Assistants and Nurse Practitioners) who all work together to provide you with the care you need, when you need it.  We recommend signing up for the patient portal called "MyChart".  Sign up information is provided on this After Visit Summary.  MyChart is used to connect with patients for Virtual Visits (Telemedicine).  Patients are able to view lab/test results, encounter notes, upcoming appointments, etc.  Non-urgent messages can be sent to your provider as well.   To learn more about what you can do with MyChart, go to [email or web address].    Your next appointment:   12 month(s)  Provider:   Chilton Si, MD, Eligha Bridegroom, NP, or Gillian Shields, NP    PHARM D IN 2 TO 3 MONTHS

## 2023-11-23 ENCOUNTER — Other Ambulatory Visit: Payer: Self-pay | Admitting: Cardiovascular Disease

## 2023-11-23 DIAGNOSIS — E7800 Pure hypercholesterolemia, unspecified: Secondary | ICD-10-CM

## 2023-11-23 DIAGNOSIS — I700 Atherosclerosis of aorta: Secondary | ICD-10-CM

## 2023-11-23 DIAGNOSIS — I2510 Atherosclerotic heart disease of native coronary artery without angina pectoris: Secondary | ICD-10-CM

## 2023-12-19 ENCOUNTER — Ambulatory Visit (HOSPITAL_BASED_OUTPATIENT_CLINIC_OR_DEPARTMENT_OTHER): Admitting: Nurse Practitioner

## 2023-12-19 ENCOUNTER — Encounter: Payer: Self-pay | Admitting: Internal Medicine

## 2023-12-19 NOTE — Patient Instructions (Addendum)
      Blood work was ordered.       Medications changes include :   None    A referral was ordered and someone will call you to schedule an appointment.     Return in about 6 months (around 06/20/2024) for Physical Exam.

## 2023-12-19 NOTE — Progress Notes (Unsigned)
 Subjective:    Patient ID: Jason Compton, male    DOB: 15-Aug-1936, 88 y.o.   MRN: [id number]     HPI Jason Compton is here for follow up of his chronic medical problems.   Appendectomy on 10/27/23.  Since then has needed prebiotic benefiber.    Ankle swelling - due to amlodipine .  Had to be switched off of the hydrochlorothiazide  because of hyponatremia.  Sleeping longer hours - bed 9-10 - sleeps til 6:30 starts to wake up - takes 15-30 min afternoon.  It does not bother him and his sleep is refreshing.  He is using the CPAP nightly.  He was not sure if this was a problem or not.  Walking for exercise.   Medications and allergies reviewed with patient and updated if appropriate.  Current Outpatient Medications on File Prior to Visit  Medication Sig Dispense Refill   amLODipine  (NORVASC ) 10 MG tablet Take 1 tablet (10 mg total) by mouth daily. 100 tablet 3   cholecalciferol (VITAMIN D ) 1000 UNITS tablet Take 1,000 Units by mouth daily.     cycloSPORINE (RESTASIS) 0.05 % ophthalmic emulsion Place 1 drop into both eyes 2 (two) times daily.     Evolocumab  (REPATHA  SURECLICK) 140 MG/ML SOAJ ADMINISTER 1 ML UNDER THE SKIN EVERY 14 DAYS 6 mL 0   ezetimibe  (ZETIA ) 10 MG tablet TAKE 1 TABLET BY MOUTH DAILY 100 tablet 2   famotidine (PEPCID) 20 MG tablet Take 20 mg by mouth daily.     Multiple Vitamin (MULTIVITAMIN) tablet Take 1 tablet by mouth daily.     Multiple Vitamins-Minerals (PRESERVISION AREDS 2) CAPS Take by mouth 2 (two) times daily.     OVER THE COUNTER MEDICATION Take 2 tablets by mouth 2 (two) times daily. hydroEyes     vitamin C (ASCORBIC ACID) 500 MG tablet Take 500 mg by mouth daily.     No current facility-administered medications on file prior to visit.     Review of Systems  Constitutional:  Negative for fever.  Respiratory:  Negative for cough, shortness of breath and wheezing.   Cardiovascular:  Negative for chest pain, palpitations and leg swelling.   Neurological:  Negative for light-headedness and headaches.  Hematological:  Bruises/bleeds easily.       Objective:   Vitals:   12/20/23 1028  BP: 120/80  Pulse: 66  Temp: 98 F (36.7 C)  SpO2: 96%   BP Readings from Last 3 Encounters:  12/20/23 120/80  11/17/23 120/84  11/02/23 (!) 150/88   Wt Readings from Last 3 Encounters:  12/20/23 208 lb (94.3 kg)  11/17/23 204 lb 3.2 oz (92.6 kg)  11/02/23 203 lb (92.1 kg)   Body mass index is 31.63 kg/m.    Physical Exam Constitutional:      General: He is not in acute distress.    Appearance: Normal appearance. He is not ill-appearing.  HENT:     Head: Normocephalic and atraumatic.  Eyes:     Conjunctiva/sclera: Conjunctivae normal.  Cardiovascular:     Rate and Rhythm: Normal rate and regular rhythm.     Heart sounds: Normal heart sounds.  Pulmonary:     Effort: Pulmonary effort is normal. No respiratory distress.     Breath sounds: Normal breath sounds. No wheezing or rales.  Musculoskeletal:     Right lower leg: Edema (mild) present.     Left lower leg: Edema (mild) present.  Skin:    General: Skin is warm and dry.  Findings: No rash.  Neurological:     Mental Status: He is alert. Mental status is at baseline.  Psychiatric:        Mood and Affect: Mood normal.        Lab Results  Component Value Date   WBC 9.3 10/28/2023   HGB 14.0 10/28/2023   HCT 40.3 10/28/2023   PLT 152 10/28/2023   GLUCOSE 94 10/30/2023   CHOL 158 06/14/2023   TRIG 75.0 06/14/2023   HDL 66.20 06/14/2023   LDLDIRECT 189.7 12/10/2011   LDLCALC 77 06/14/2023   ALT 14 10/27/2023   AST 21 10/27/2023   NA 133 (L) 10/30/2023   K 3.8 10/30/2023   CL 100 10/30/2023   CREATININE 0.61 10/30/2023   BUN 11 10/30/2023   CO2 24 10/30/2023   TSH 0.98 12/09/2022   PSA 6.98 (H) 10/03/2015   INR 1.2 10/27/2023   HGBA1C 5.8 06/14/2023     Assessment & Plan:    See Problem List for Assessment and Plan of chronic medical  problems.

## 2023-12-20 ENCOUNTER — Ambulatory Visit (INDEPENDENT_AMBULATORY_CARE_PROVIDER_SITE_OTHER): Payer: Medicare Other | Admitting: Internal Medicine

## 2023-12-20 VITALS — BP 120/80 | HR 66 | Temp 98.0°F | Ht 68.0 in | Wt 208.0 lb

## 2023-12-20 DIAGNOSIS — I10 Essential (primary) hypertension: Secondary | ICD-10-CM

## 2023-12-20 DIAGNOSIS — G4733 Obstructive sleep apnea (adult) (pediatric): Secondary | ICD-10-CM

## 2023-12-20 DIAGNOSIS — R7303 Prediabetes: Secondary | ICD-10-CM

## 2023-12-20 DIAGNOSIS — E7800 Pure hypercholesterolemia, unspecified: Secondary | ICD-10-CM

## 2023-12-20 DIAGNOSIS — I2510 Atherosclerotic heart disease of native coronary artery without angina pectoris: Secondary | ICD-10-CM

## 2023-12-20 DIAGNOSIS — C61 Malignant neoplasm of prostate: Secondary | ICD-10-CM

## 2023-12-20 NOTE — Assessment & Plan Note (Addendum)
 Chronic Following with cardiology No symptoms consistent with angina Continue Zetia  10 mg daily, repatha  140 mg Q 14 days Continue regular exercise

## 2023-12-20 NOTE — Assessment & Plan Note (Signed)
Chronic Using cpap nightly 

## 2023-12-20 NOTE — Assessment & Plan Note (Addendum)
 Chronic Blood pressure well controlled Continue amlodipine  10 mg daily-he is having some increased ankle swelling and will contact cardiology if this becomes worse or bothersome

## 2023-12-20 NOTE — Assessment & Plan Note (Addendum)
 Chronic Lab Results  Component Value Date   HGBA1C 5.8 06/14/2023   Low sugar / carb diet Stressed regular exercise

## 2023-12-20 NOTE — Assessment & Plan Note (Addendum)
 Chronic Regular exercise and healthy diet encouraged Continue Zetia  10 mg daily, repatha  140 mg q 14 days

## 2023-12-20 NOTE — Assessment & Plan Note (Signed)
 Chronic S/p radiation treatment Follows with urology regularly CT PET without evidence of metastatic disease PSA is slowly rising and urology is monitoring closely

## 2023-12-21 DIAGNOSIS — H353211 Exudative age-related macular degeneration, right eye, with active choroidal neovascularization: Secondary | ICD-10-CM | POA: Diagnosis not present

## 2024-01-05 NOTE — Telephone Encounter (Signed)
 Patient was seen by Dr Theodis Fiscal 3/27

## 2024-01-13 ENCOUNTER — Ambulatory Visit (HOSPITAL_BASED_OUTPATIENT_CLINIC_OR_DEPARTMENT_OTHER): Admitting: Pharmacist Clinician (PhC)/ Clinical Pharmacy Specialist

## 2024-01-13 ENCOUNTER — Encounter (HOSPITAL_BASED_OUTPATIENT_CLINIC_OR_DEPARTMENT_OTHER): Payer: Self-pay

## 2024-01-13 VITALS — BP 124/60 | HR 73 | Ht 68.0 in | Wt 213.0 lb

## 2024-01-13 DIAGNOSIS — I10 Essential (primary) hypertension: Secondary | ICD-10-CM

## 2024-01-13 MED ORDER — AMLODIPINE BESYLATE 5 MG PO TABS
5.0000 mg | ORAL_TABLET | Freq: Every day | ORAL | 3 refills | Status: DC
Start: 2024-01-13 — End: 2024-06-24

## 2024-01-13 MED ORDER — VALSARTAN 80 MG PO TABS: 80.0000 mg | ORAL_TABLET | Freq: Every day | ORAL | 3 refills | Status: DC

## 2024-01-13 NOTE — Progress Notes (Signed)
 Office Visit    Patient Name: Jason Compton Date of Encounter: 01/14/2024  Primary Care Provider:  Colene Dauphin, MD Primary Cardiologist:  Maudine Sos, MD  Chief Complaint    Hypertension  Significant Past Medical History   CAD Non-obstructive  HLD 10/24 LDL 77 on evolocumab  and ezetimibe   preDM 10/24 A1c 5.8  OSA On CPAP  Celiac disease     Allergies  Allergen Reactions   Altace [Ramipril]     Doesn't recall reaction   Lipitor [Atorvastatin ] Other (See Comments)    Joint pain, myalgias   Rosuvastatin  Other (See Comments)    Myalgias   Ceftriaxone  Rash    11/01/23- developed abdominal rash, red, raised, not itching, afebrile. Started ceftriaxone  on 3/6 but also received vancomycin , cefepime  and metronidazole  on 3/6.   Hydrochlorothiazide  Other (See Comments)    hyponatremia    History of Present Illness    Jason Compton is a 88 y.o. male patient of Dr Theodis Fiscal, in the office today for hypertension evaluation.   He was most recently seen by Dr. Theodis Fiscal in March, following a hospitalization for appendectomy, complicated by e coli bacteremia.  While his BP was good in the office that day (120/84), he did report higher readings at home - 136-150 systolic.  She increased his amlodipine  to 10 mg daily and asked that he follow up in 2 months.    Today he is in the office for follow up.  He was seen by his PCP, Oma Bias, about 3-4 weeks ago and his pressure was sill WNL at that time (120/80).   Today he notes that he has developed edema in his ankles since increasing the amlodipine  dose.  Otherwise, he states home readings to be WNL.  He is staying active travelling and has a trip to [place] planned.   Blood Pressure Goal:  130/80  Current Medications:  amlodipine  10 mg every day   Previously tried:  hctz - hyponatremia   Ramipril - unknown reaction  Family Hx: brothers 8,12 years younger, one with Raynaud's, one with COPD; daughter healthy at 35    Social Hx:      Tobacco: no  Alcohol: wine most days  Caffeine: 1 cup of coffee daily   Diet:    mix of home and eating out; still enjoys cooking; variety of proteins, more beef and chicken, plenty of salmon; regular vegetables, usually fresh, dessert is usually fruit  Exercise: walks some, about 2 miles per day  Home BP readings:   occasionally, 120-130 mostly, if sits for 5-10 min always down to 120s  Adherence Assessment  Do you ever forget to take your medication? [] Yes [x] No  Do you ever skip doses due to side effects? [] Yes [x] No  Do you have trouble affording your medicines? [] Yes [x] No  Are you ever unable to pick up your medication due to transportation difficulties? [] Yes [x] No   Adherence strategy: none  Accessory Clinical Findings    Lab Results  Component Value Date   CREATININE 0.61 10/30/2023   BUN 11 10/30/2023   NA 133 (L) 10/30/2023   K 3.8 10/30/2023   CL 100 10/30/2023   CO2 24 10/30/2023   Lab Results  Component Value Date   ALT 14 10/27/2023   AST 21 10/27/2023   ALKPHOS 43 10/27/2023   BILITOT 1.9 (H) 10/27/2023   Lab Results  Component Value Date   HGBA1C 5.8 06/14/2023    Home Medications    Current Outpatient Medications  Medication Sig Dispense Refill   amLODipine  (NORVASC ) 5 MG tablet Take 1 tablet (5 mg total) by mouth daily. 90 tablet 3   cholecalciferol (VITAMIN D ) 1000 UNITS tablet Take 1,000 Units by mouth daily.     cycloSPORINE (RESTASIS) 0.05 % ophthalmic emulsion Place 1 drop into both eyes 2 (two) times daily.     Evolocumab  (REPATHA  SURECLICK) 140 MG/ML SOAJ ADMINISTER 1 ML UNDER THE SKIN EVERY 14 DAYS 6 mL 0   ezetimibe  (ZETIA ) 10 MG tablet TAKE 1 TABLET BY MOUTH DAILY 100 tablet 2   famotidine (PEPCID) 20 MG tablet Take 20 mg by mouth daily.     Multiple Vitamin (MULTIVITAMIN) tablet Take 1 tablet by mouth daily.     Multiple Vitamins-Minerals (PRESERVISION AREDS 2) CAPS Take by mouth 2 (two) times daily.     OVER  THE COUNTER MEDICATION Take 2 tablets by mouth 2 (two) times daily. hydroEyes     valsartan (DIOVAN) 80 MG tablet Take 1 tablet (80 mg total) by mouth daily. 90 tablet 3   vitamin C (ASCORBIC ACID) 500 MG tablet Take 500 mg by mouth daily.     No current facility-administered medications for this visit.         Assessment & Plan    Hypertension Assessment: BP is controlled in office BP 124/60 mmHg;  above the goal (<130/80). Edema in both ankles since increased dose of amlodipine  to 10 mg Denies SOB, palpitation, chest pain, headaches Reiterated the importance of regular exercise and low salt diet   Plan:  Hold amlodipine  dose tonight then decrease to 5 mg each evening Start taking valsartan 80 mg once daily in the mornings Patient to keep record of BP readings with heart rate and report to us  should they exceed 130/80 Patient to follow up with Dr. Theodis Fiscal in 2026  Labs ordered today:  BMET in 10-14 days   Donivan Furry PharmD CPP [facility]  [address] [place], [place] [address] [phone]

## 2024-01-13 NOTE — Patient Instructions (Signed)
 Follow up appointment: with Dr. Theodis Fiscal next March  Go to the lab in 2 weeks to check kidney function  (around June 4-6) [facility] locations: [facility] - [address] [phone]) - [phone] [address] ([facility]) [phone]) - [phone]) - [phone] [address] [phone])   Take your BP meds as follows:  START VALSARTAN  80 MG ONCE DAILY IN THE MORNINGS  DECREASE AMLODIPINE  TO 5 MG ONCE DAILY IN THE EVENINGS  Check your blood pressure at home daily and keep record of the readings.  Your blood pressure goal is < 130/80  To check your pressure at home you will need to:  1. Sit up in a chair, with feet flat on the floor and back supported. Do not cross your ankles or legs. 2. Rest your left arm so that the cuff is about heart level. If the cuff goes on your upper arm,  then just relax the arm on the table, arm of the chair or your lap. If you have a wrist cuff, we  suggest relaxing your wrist against your chest (think of it as Pledging the Flag with the  wrong arm).  3. Place the cuff snugly around your arm, about 1 inch above the crook of your elbow. The  cords should be inside the groove of your elbow.  4. Sit quietly, with the cuff in place, for about 5 minutes. After that 5 minutes press the power  button to start a reading. 5. Do not talk or move while the reading is taking place.  6. Record your readings on a sheet of paper. Although most cuffs have a memory, it is often  easier to see a pattern developing when the numbers are all in front of you.  7. You can repeat the reading after 1-3 minutes if it is recommended  Make sure your bladder is empty and you have not had caffeine or tobacco within the last 30 min  Always bring your blood pressure log with you to your appointments. If you have not brought your monitor in to be double checked for accuracy, please bring it to your next  appointment.  You can find a list of quality blood pressure cuffs at [email or web address].no  Important lifestyle changes to control high blood pressure  Intervention  Effect on the BP  Lose extra pounds and watch your waistline Weight loss is one of the most effective lifestyle changes for controlling blood pressure. If you're overweight or obese, losing even a small amount of weight can help reduce blood pressure. Blood pressure might go down by about 1 millimeter of mercury (mm Hg) with each kilogram (about 2.2 pounds) of weight lost.  Exercise regularly As a general goal, aim for at least 30 minutes of moderate physical activity every day. Regular physical activity can lower high blood pressure by about 5 to 8 mm Hg.  Eat a healthy diet Eating a diet rich in whole grains, fruits, vegetables, and low-fat dairy products and low in saturated fat and cholesterol. A healthy diet can lower high blood pressure by up to 11 mm Hg.  Reduce salt (sodium) in your diet Even a small reduction of sodium in the diet can improve heart health and reduce high blood pressure by about 5 to 6 mm Hg.  Limit alcohol One drink equals 12 ounces of beer, 5 ounces of wine, or 1.5 ounces of 80-proof liquor.  Limiting alcohol to less than one  drink a day for women or two drinks a day for men can help lower blood pressure by about 4 mm Hg.   If you have any questions or concerns please use My Chart to send questions or call the office at [phone]

## 2024-01-14 NOTE — Assessment & Plan Note (Signed)
 Assessment: BP is controlled in office BP 124/60 mmHg;  above the goal (<130/80). Edema in both ankles since increased dose of amlodipine  to 10 mg Denies SOB, palpitation, chest pain, headaches Reiterated the importance of regular exercise and low salt diet   Plan:  Hold amlodipine  dose tonight then decrease to 5 mg each evening Start taking valsartan 80 mg once daily in the mornings Patient to keep record of BP readings with heart rate and report to us  should they exceed 130/80 Patient to follow up with Dr. Theodis Fiscal in 2026  Labs ordered today:  BMET in 10-14 days

## 2024-01-24 DIAGNOSIS — H353122 Nonexudative age-related macular degeneration, left eye, intermediate dry stage: Secondary | ICD-10-CM | POA: Diagnosis not present

## 2024-01-24 DIAGNOSIS — H43813 Vitreous degeneration, bilateral: Secondary | ICD-10-CM | POA: Diagnosis not present

## 2024-01-24 DIAGNOSIS — H353211 Exudative age-related macular degeneration, right eye, with active choroidal neovascularization: Secondary | ICD-10-CM | POA: Diagnosis not present

## 2024-02-14 DIAGNOSIS — I10 Essential (primary) hypertension: Secondary | ICD-10-CM | POA: Diagnosis not present

## 2024-02-15 ENCOUNTER — Ambulatory Visit: Payer: Self-pay | Admitting: Pharmacist Clinician (PhC)/ Clinical Pharmacy Specialist

## 2024-02-15 DIAGNOSIS — I10 Essential (primary) hypertension: Secondary | ICD-10-CM

## 2024-02-15 LAB — BASIC METABOLIC PANEL WITH GFR
BUN/Creatinine Ratio: 13 (ref 10–24)
BUN: 11 mg/dL (ref 8–27)
CO2: 20 mmol/L (ref 20–29)
Calcium: 8.9 mg/dL (ref 8.6–10.2)
Chloride: 100 mmol/L (ref 96–106)
Creatinine, Ser: 0.84 mg/dL (ref 0.76–1.27)
Glucose: 95 mg/dL (ref 70–99)
Potassium: 4.4 mmol/L (ref 3.5–5.2)
Sodium: 136 mmol/L (ref 134–144)
eGFR: 85 mL/min/{1.73_m2} (ref >59)

## 2024-02-16 MED ORDER — VALSARTAN 160 MG PO TABS: 160.0000 mg | ORAL_TABLET | Freq: Every day | ORAL | 3 refills | Status: AC

## 2024-03-01 ENCOUNTER — Other Ambulatory Visit: Payer: Self-pay | Admitting: Cardiovascular Disease

## 2024-03-01 DIAGNOSIS — C4442 Squamous cell carcinoma of skin of scalp and neck: Secondary | ICD-10-CM | POA: Diagnosis not present

## 2024-03-01 DIAGNOSIS — I700 Atherosclerosis of aorta: Secondary | ICD-10-CM

## 2024-03-01 DIAGNOSIS — L570 Actinic keratosis: Secondary | ICD-10-CM | POA: Diagnosis not present

## 2024-03-01 DIAGNOSIS — D485 Neoplasm of uncertain behavior of skin: Secondary | ICD-10-CM | POA: Diagnosis not present

## 2024-03-01 DIAGNOSIS — C44622 Squamous cell carcinoma of skin of right upper limb, including shoulder: Secondary | ICD-10-CM | POA: Diagnosis not present

## 2024-03-01 DIAGNOSIS — E7800 Pure hypercholesterolemia, unspecified: Secondary | ICD-10-CM

## 2024-03-01 DIAGNOSIS — Z85828 Personal history of other malignant neoplasm of skin: Secondary | ICD-10-CM | POA: Diagnosis not present

## 2024-03-01 DIAGNOSIS — I2510 Atherosclerotic heart disease of native coronary artery without angina pectoris: Secondary | ICD-10-CM

## 2024-03-01 DIAGNOSIS — D1801 Hemangioma of skin and subcutaneous tissue: Secondary | ICD-10-CM | POA: Diagnosis not present

## 2024-03-01 DIAGNOSIS — I10 Essential (primary) hypertension: Secondary | ICD-10-CM | POA: Diagnosis not present

## 2024-03-01 DIAGNOSIS — L300 Nummular dermatitis: Secondary | ICD-10-CM | POA: Diagnosis not present

## 2024-03-01 DIAGNOSIS — L814 Other melanin hyperpigmentation: Secondary | ICD-10-CM | POA: Diagnosis not present

## 2024-03-01 DIAGNOSIS — L821 Other seborrheic keratosis: Secondary | ICD-10-CM | POA: Diagnosis not present

## 2024-03-02 LAB — BASIC METABOLIC PANEL WITH GFR
BUN/Creatinine Ratio: 13 (ref 10–24)
BUN: 10 mg/dL (ref 8–27)
CO2: 24 mmol/L (ref 20–29)
Calcium: 9 mg/dL (ref 8.6–10.2)
Chloride: 103 mmol/L (ref 96–106)
Creatinine, Ser: 0.77 mg/dL (ref 0.76–1.27)
Glucose: 102 mg/dL — ABNORMAL HIGH (ref 70–99)
Potassium: 4.8 mmol/L (ref 3.5–5.2)
Sodium: 140 mmol/L (ref 134–144)
eGFR: 88 mL/min/1.73 (ref >59)

## 2024-03-13 ENCOUNTER — Other Ambulatory Visit: Payer: Self-pay | Admitting: Cardiovascular Disease

## 2024-03-13 DIAGNOSIS — H353122 Nonexudative age-related macular degeneration, left eye, intermediate dry stage: Secondary | ICD-10-CM | POA: Diagnosis not present

## 2024-03-13 DIAGNOSIS — H353211 Exudative age-related macular degeneration, right eye, with active choroidal neovascularization: Secondary | ICD-10-CM | POA: Diagnosis not present

## 2024-03-13 DIAGNOSIS — H43813 Vitreous degeneration, bilateral: Secondary | ICD-10-CM | POA: Diagnosis not present

## 2024-04-03 DIAGNOSIS — C4442 Squamous cell carcinoma of skin of scalp and neck: Secondary | ICD-10-CM | POA: Diagnosis not present

## 2024-04-10 ENCOUNTER — Telehealth: Payer: Self-pay | Admitting: Cardiovascular Disease

## 2024-04-10 DIAGNOSIS — I2510 Atherosclerotic heart disease of native coronary artery without angina pectoris: Secondary | ICD-10-CM

## 2024-04-10 DIAGNOSIS — E785 Hyperlipidemia, unspecified: Secondary | ICD-10-CM

## 2024-04-10 NOTE — Telephone Encounter (Signed)
 Pt c/o medication issue:  1. Name of Medication:   REPATHA  SURECLICK 140 MG/ML SOAJ    2. How are you currently taking this medication (dosage and times per day)?  ADMINISTER 1 ML UNDER THE SKIN EVERY 14 DAYS     3. Are you having a reaction (difficulty breathing--STAT)? No  4. What is your medication issue? Pt is requesting a callback regarding him being contacted by pharmacy stating this medication is no longer being manufactured. He stated he'll need another shot on 9/1 but he's completely out right now and would like to know what to do next. Please advise

## 2024-04-11 MED ORDER — REPATHA SURECLICK 140 MG/ML ~~LOC~~ SOAJ: 1.0000 mL | SUBCUTANEOUS | 1 refills | Status: DC

## 2024-05-03 ENCOUNTER — Other Ambulatory Visit: Payer: Self-pay

## 2024-05-03 ENCOUNTER — Other Ambulatory Visit (HOSPITAL_BASED_OUTPATIENT_CLINIC_OR_DEPARTMENT_OTHER): Payer: Self-pay

## 2024-05-03 MED ORDER — COMIRNATY 30 MCG/0.3ML IM SUSY
0.3000 mL | PREFILLED_SYRINGE | Freq: Once | INTRAMUSCULAR | 0 refills | Status: AC
  Filled 2024-05-03 – 2024-05-04 (×2): qty 0.3, 1d supply, fill #0

## 2024-05-04 ENCOUNTER — Other Ambulatory Visit (HOSPITAL_BASED_OUTPATIENT_CLINIC_OR_DEPARTMENT_OTHER): Payer: Self-pay

## 2024-05-04 MED ORDER — FLUZONE HIGH-DOSE 0.5 ML IM SUSY
0.5000 mL | PREFILLED_SYRINGE | Freq: Once | INTRAMUSCULAR | 0 refills | Status: AC
Start: 2024-05-04 — End: 2024-05-05
  Filled 2024-05-04: qty 0.5, 1d supply, fill #0

## 2024-05-07 ENCOUNTER — Encounter (HOSPITAL_BASED_OUTPATIENT_CLINIC_OR_DEPARTMENT_OTHER): Payer: Self-pay | Admitting: Cardiovascular Disease

## 2024-05-07 ENCOUNTER — Other Ambulatory Visit (HOSPITAL_BASED_OUTPATIENT_CLINIC_OR_DEPARTMENT_OTHER): Payer: Self-pay

## 2024-05-07 DIAGNOSIS — I2510 Atherosclerotic heart disease of native coronary artery without angina pectoris: Secondary | ICD-10-CM

## 2024-05-07 DIAGNOSIS — E785 Hyperlipidemia, unspecified: Secondary | ICD-10-CM

## 2024-05-07 MED ORDER — REPATHA SURECLICK 140 MG/ML ~~LOC~~ SOAJ
1.0000 mL | SUBCUTANEOUS | 1 refills | Status: AC
  Filled 2024-05-07: qty 6, 84d supply, fill #0
  Filled 2024-07-23: qty 6, 84d supply, fill #1

## 2024-05-17 DIAGNOSIS — H353122 Nonexudative age-related macular degeneration, left eye, intermediate dry stage: Secondary | ICD-10-CM | POA: Diagnosis not present

## 2024-05-17 DIAGNOSIS — H353211 Exudative age-related macular degeneration, right eye, with active choroidal neovascularization: Secondary | ICD-10-CM | POA: Diagnosis not present

## 2024-05-17 DIAGNOSIS — H43813 Vitreous degeneration, bilateral: Secondary | ICD-10-CM | POA: Diagnosis not present

## 2024-05-17 DIAGNOSIS — H43393 Other vitreous opacities, bilateral: Secondary | ICD-10-CM | POA: Diagnosis not present

## 2024-05-25 ENCOUNTER — Ambulatory Visit: Admitting: Family

## 2024-05-25 VITALS — BP 132/70 | HR 79 | Temp 98.2°F | Ht 68.0 in | Wt 207.0 lb

## 2024-05-25 DIAGNOSIS — J069 Acute upper respiratory infection, unspecified: Secondary | ICD-10-CM

## 2024-05-25 DIAGNOSIS — R051 Acute cough: Secondary | ICD-10-CM | POA: Diagnosis not present

## 2024-05-25 LAB — POC COVID19 BINAXNOW: SARS Coronavirus 2 Ag: NEGATIVE

## 2024-05-25 MED ORDER — METHYLPREDNISOLONE 4 MG PO TBPK: ORAL_TABLET | ORAL | 0 refills | Status: DC

## 2024-05-25 MED ORDER — HYDROCODONE BIT-HOMATROP MBR 5-1.5 MG/5ML PO SOLN
5.0000 mL | Freq: Three times a day (TID) | ORAL | 0 refills | Status: DC | PRN
Start: 2024-05-25 — End: 2024-05-28

## 2024-05-25 NOTE — Progress Notes (Signed)
 Acute Office Visit  Subjective:     Patient ID: Jason Compton, male    DOB: 1936-02-07, 88 y.o.   MRN: [id number]  Chief Complaint  Patient presents with  . Cough    Cough and congestion that started yesterday    HPI 88 year old male, patient of Dr. Geofm is in today with complaints of cough, nasal congestion, chest congestion x 2 days.  Took Hycodan syrup last night that did help some.  Has had influenza and COVID vaccines on September 12.  No known sick contacts.  Review of Systems  Constitutional:  Positive for chills. Negative for fever.  HENT:  Positive for congestion.   Respiratory:  Positive for cough. Negative for shortness of breath and wheezing.   Cardiovascular: Negative.   Musculoskeletal: Negative.   Neurological: Negative.   Psychiatric/Behavioral: Negative.     Past Medical History:  Diagnosis Date  . Aortic atherosclerosis   . CAD (coronary artery disease)   . Daytime sleepiness   . Exogenous obesity   . GERD (gastroesophageal reflux disease)   . History of hiatal hernia   . Hyperlipidemia   . Hypertension   . OSA on CPAP   . Prostate cancer (HCC)   . PVC's (premature ventricular contractions)   . Skin cancer     Social History   Socioeconomic History  . Marital status: Married    Spouse name: Not on file  . Number of children: Not on file  . Years of education: Not on file  . Highest education level: Doctorate  Occupational History  . Occupation: Art gallery manager    Comment: retired  Tobacco Use  . Smoking status: Never  . Smokeless tobacco: Never  Vaping Use  . Vaping status: Never Used  Substance and Sexual Activity  . Alcohol use: Yes    Alcohol/week: 0.0 standard drinks of alcohol    Comment: wine  . Drug use: No  . Sexual activity: Yes  Other Topics Concern  . Not on file  Social History Narrative  . Not on file   Social Drivers of Health   Financial Resource Strain: Low Risk  (12/17/2023)   Overall Financial Resource Strain  (CARDIA)   . Difficulty of Paying Living Expenses: Not hard at all  Food Insecurity: No Food Insecurity (12/17/2023)   Hunger Vital Sign   . Worried About Programme researcher, broadcasting/film/video in the Last Year: Never true   . Ran Out of Food in the Last Year: Never true  Transportation Needs: No Transportation Needs (12/17/2023)   PRAPARE - Transportation   . Lack of Transportation (Medical): No   . Lack of Transportation (Non-Medical): No  Physical Activity: Insufficiently Active (12/17/2023)   Exercise Vital Sign   . Days of Exercise per Week: 3 days   . Minutes of Exercise per Session: 20 min  Stress: No Stress Concern Present (12/17/2023)   [employer] of Occupational Health - Occupational Stress Questionnaire   . Feeling of Stress : Not at all  Social Connections: Moderately Integrated (12/17/2023)   Social Connection and Isolation Panel   . Frequency of Communication with Friends and Family: More than three times a week   . Frequency of Social Gatherings with Friends and Family: Once a week   . Attends Religious Services: Never   . Active Member of Clubs or Organizations: No   . Attends Banker Meetings: 1 to 4 times per year   . Marital Status: Married  Catering manager Violence: Patient Declined (  11/01/2023)   Humiliation, Afraid, Rape, and Kick questionnaire   . Fear of Current or Ex-Partner: Patient declined   . Emotionally Abused: Patient declined   . Physically Abused: Patient declined   . Sexually Abused: Patient declined    Past Surgical History:  Procedure Laterality Date  . CARDIOVASCULAR STRESS TEST  04/18/2009   NORMAL  . CATARACT EXTRACTION    . LAPAROSCOPIC APPENDECTOMY N/A 10/27/2023   Procedure: APPENDECTOMY, LAPAROSCOPIC;  Surgeon: Ebbie Cough, MD;  Location: [facility] OR;  Service: General;  Laterality: N/A;    Family History  Problem Relation Age of Onset  . Osteoporosis Mother   . Cancer Father        skin  . Skin cancer Father   . Hypertension  Brother   . Skin cancer Brother   . Breast cancer Neg Hx   . Colon cancer Neg Hx   . Pancreatic cancer Neg Hx   . Prostate cancer Neg Hx     Allergies  Allergen Reactions  . Altace [Ramipril]     Doesn't recall reaction  . Lipitor [Atorvastatin ] Other (See Comments)    Joint pain, myalgias  . Rosuvastatin  Other (See Comments)    Myalgias  . Ceftriaxone  Rash    11/01/23- developed abdominal rash, red, raised, not itching, afebrile. Started ceftriaxone  on 3/6 but also received vancomycin , cefepime  and metronidazole  on 3/6.  . Hydrochlorothiazide  Other (See Comments)    hyponatremia    Current Outpatient Medications on File Prior to Visit  Medication Sig Dispense Refill  . cholecalciferol (VITAMIN D ) 1000 UNITS tablet Take 1,000 Units by mouth daily.    . cycloSPORINE (RESTASIS) 0.05 % ophthalmic emulsion Place 1 drop into both eyes 2 (two) times daily.    . Evolocumab  (REPATHA  SURECLICK) 140 MG/ML SOAJ Inject 140 mg into the skin every 14 (fourteen) days. 6 mL 1  . ezetimibe  (ZETIA ) 10 MG tablet TAKE 1 TABLET BY MOUTH DAILY 90 tablet 2  . famotidine (PEPCID) 20 MG tablet Take 20 mg by mouth daily.    . Multiple Vitamin (MULTIVITAMIN) tablet Take 1 tablet by mouth daily.    . Multiple Vitamins-Minerals (PRESERVISION AREDS 2) CAPS Take by mouth 2 (two) times daily.    SABRA OVER THE COUNTER MEDICATION Take 2 tablets by mouth 2 (two) times daily. hydroEyes    . valsartan  (DIOVAN ) 160 MG tablet Take 1 tablet (160 mg total) by mouth daily. 90 tablet 3  . vitamin C (ASCORBIC ACID) 500 MG tablet Take 500 mg by mouth daily.    . amLODipine  (NORVASC ) 5 MG tablet Take 1 tablet (5 mg total) by mouth daily. 90 tablet 3   No current facility-administered medications on file prior to visit.    BP 132/70 (BP Location: Left Arm, Patient Position: Sitting)   Pulse 79   Temp 98.2 F (36.8 C) (Oral)   Ht 5' 8 (1.727 m)   Wt 207 lb (93.9 kg)   SpO2 96%   BMI 31.47 kg/m chart      Objective:     BP 132/70 (BP Location: Left Arm, Patient Position: Sitting)   Pulse 79   Temp 98.2 F (36.8 C) (Oral)   Ht 5' 8 (1.727 m)   Wt 207 lb (93.9 kg)   SpO2 96%   BMI 31.47 kg/m    Physical Exam Vitals reviewed.  Constitutional:      Appearance: He is obese.  HENT:     Right Ear: Tympanic membrane, ear canal and external ear  normal.     Left Ear: Tympanic membrane, ear canal and external ear normal.     Nose: Congestion present.     Mouth/Throat:     Mouth: Mucous membranes are moist.  Cardiovascular:     Rate and Rhythm: Normal rate and regular rhythm.     Pulses: Normal pulses.  Pulmonary:     Effort: Pulmonary effort is normal.     Breath sounds: Normal breath sounds. No wheezing.  Musculoskeletal:        General: Normal range of motion.     Cervical back: Normal range of motion and neck supple.  Skin:    General: Skin is warm and dry.  Neurological:     General: No focal deficit present.     Mental Status: He is alert and oriented to person, place, and time. Mental status is at baseline.  Psychiatric:        Mood and Affect: Mood normal.        Behavior: Behavior normal.    No results found for any visits on 05/25/24.      Assessment & Plan:   Problem List Items Addressed This Visit     Cough   Other Visit Diagnoses       Upper respiratory tract infection, unspecified type    -  Primary       Meds ordered this encounter  Medications  . methylPREDNISolone (MEDROL DOSEPAK) 4 MG TBPK tablet    Sig: As directed    Dispense:  21 tablet    Refill:  0  . HYDROcodone  bit-homatropine (HYCODAN) 5-1.5 MG/5ML syrup    Sig: Take 5 mLs by mouth every 8 (eight) hours as needed for cough.    Dispense:  120 mL    Refill:  0   Call the office if symptoms worsen or persist.  Recheck as scheduled and sooner as needed. No follow-ups on file.  Meade Hogeland B Lima Chillemi, FNP

## 2024-05-25 NOTE — Addendum Note (Signed)
 Addended by: CLAUDENE TOBIAS PARAS on: 05/25/2024 02:13 PM   Modules accepted: Orders

## 2024-05-28 ENCOUNTER — Telehealth: Payer: Self-pay

## 2024-05-28 MED ORDER — HYDROCOD POLI-CHLORPHE POLI ER 10-8 MG/5ML PO SUER: 5.0000 mL | Freq: Two times a day (BID) | ORAL | 0 refills | Status: DC | PRN

## 2024-05-28 NOTE — Telephone Encounter (Signed)
 sent

## 2024-05-29 ENCOUNTER — Ambulatory Visit: Payer: Self-pay

## 2024-05-29 NOTE — Telephone Encounter (Signed)
 Copied from CRM #[id number]. Topic: Clinical - Medication Question >> May 29, 2024  1:27 PM [place] J wrote: Reason for CRM: Bartholome calling from the pharmacy to let Dr. Geofm know that the patient received a prescription for hydrocodone  3 days ago. She is wondering if she still wanted to prescribe chlorpheniramine-HYDROcodone  (TUSSIONEX) 10-8 MG/5ML.  [facility]: [phone]

## 2024-05-30 NOTE — Telephone Encounter (Signed)
 Called today and script had already been picked up

## 2024-06-24 ENCOUNTER — Encounter: Payer: Self-pay | Admitting: Internal Medicine

## 2024-06-24 NOTE — Progress Notes (Unsigned)
 Subjective:    Patient ID: Jason Compton, male    DOB: [birth year], 88 y.o.   MRN: [id number]     HPI Jason Compton is here for a physical exam and his chronic medical problems.  At night his skin feels very hot - his wife notices this at night.  He does not feel hot.  He denies any unusual sweats, chills or fevers.  No other concerning symptoms.  She is not sure if that was significant or not.    Medications and allergies reviewed with patient and updated if appropriate.  Current Outpatient Medications on File Prior to Visit  Medication Sig Dispense Refill   cholecalciferol (VITAMIN D ) 1000 UNITS tablet Take 1,000 Units by mouth daily.     cycloSPORINE (RESTASIS) 0.05 % ophthalmic emulsion Place 1 drop into both eyes 2 (two) times daily.     Evolocumab  (REPATHA  SURECLICK) 140 MG/ML SOAJ Inject 140 mg into the skin every 14 (fourteen) days. 6 mL 1   ezetimibe  (ZETIA ) 10 MG tablet TAKE 1 TABLET BY MOUTH DAILY 90 tablet 2   famotidine (PEPCID) 20 MG tablet Take 20 mg by mouth daily.     Multiple Vitamin (MULTIVITAMIN) tablet Take 1 tablet by mouth daily.     Multiple Vitamins-Minerals (PRESERVISION AREDS 2) CAPS Take by mouth 2 (two) times daily.     OVER THE COUNTER MEDICATION Take 2 tablets by mouth 2 (two) times daily. hydroEyes     valsartan  (DIOVAN ) 160 MG tablet Take 1 tablet (160 mg total) by mouth daily. 90 tablet 3   vitamin C (ASCORBIC ACID) 500 MG tablet Take 500 mg by mouth daily.     No current facility-administered medications on file prior to visit.    Review of Systems  Constitutional:  Negative for chills and fever.  HENT:  Positive for rhinorrhea and sneezing.   Eyes:  Negative for visual disturbance.  Respiratory:  Negative for cough, shortness of breath and wheezing.   Cardiovascular:  Negative for chest pain, palpitations and leg swelling (mild LLE).  Gastrointestinal:  Negative for abdominal pain, blood in stool, constipation and diarrhea.       No gerd -  controlled  Genitourinary:  Negative for difficulty urinating, dysuria and hematuria.  Musculoskeletal:  Negative for arthralgias and back pain.  Skin:  Negative for rash.  Neurological:  Negative for light-headedness and headaches.       Balance is not as good, gait has changed per wife - shuffles more when walking  Psychiatric/Behavioral:  Negative for dysphoric mood. The patient is not nervous/anxious.        Objective:   Vitals:   06/25/24 1319  BP: 138/76  Pulse: 68  Temp: 98.2 F (36.8 C)  SpO2: 96%   Filed Weights   06/25/24 1319  Weight: 209 lb (94.8 kg)   Body mass index is 31.78 kg/m.  BP Readings from Last 3 Encounters:  06/25/24 138/76  05/25/24 132/70  01/13/24 124/60    Wt Readings from Last 3 Encounters:  06/25/24 209 lb (94.8 kg)  05/25/24 207 lb (93.9 kg)  01/13/24 213 lb (96.6 kg)      Physical Exam Constitutional: He appears well-developed and well-nourished. No distress.  HENT:  Head: Normocephalic and atraumatic.  Right Ear: External ear normal.  Left Ear: External ear normal.  Normal ear canals and TM b/l  Mouth/Throat: Oropharynx is clear and moist. Eyes: Conjunctivae and EOM are normal.  Neck: Neck supple. No tracheal deviation present. No  thyromegaly present.  No carotid bruit  Cardiovascular: Normal rate, regular rhythm, normal heart sounds and intact distal pulses.   No murmur heard.  No lower extremity edema. Pulmonary/Chest: Effort normal and breath sounds normal. No respiratory distress. He has no wheezes. He has no rales.  Abdominal: Soft. He exhibits no distension. There is no tenderness.  Genitourinary: deferred  Lymphadenopathy:   He has no cervical adenopathy.  Skin: Skin is warm and dry. He is not diaphoretic.  Psychiatric: He has a normal mood and affect. His behavior is normal.         Assessment & Plan:   Physical exam: Screening blood work  ordered Exercise   none Weight  obese, mild Substance abuse    none   Reviewed recommended immunizations.   Health Maintenance  Topic Date Due   Medicare Annual Wellness (AWV)  07/25/2024 (Originally [birth year])   COVID-19 Vaccine (9 - Pfizer risk 2025-26 season) 11/01/2024   DTaP/Tdap/Td (2 - Td or Tdap) 11/10/2027   Pneumococcal Vaccine: 50+ Years  Completed   Influenza Vaccine  Completed   Zoster Vaccines- Shingrix  Completed   Meningococcal B Vaccine  Aged Out     See Problem List for Assessment and Plan of chronic medical problems.

## 2024-06-24 NOTE — Patient Instructions (Addendum)
 Blood work was ordered.       Medications changes include :   None    A referral was ordered and someone will call you to schedule an appointment.     Return in about 6 months (around 12/23/2024) for follow up.   Health Maintenance, Male Adopting a healthy lifestyle and getting preventive care are important in promoting health and wellness. Ask your health care provider about: The right schedule for you to have regular tests and exams. Things you can do on your own to prevent diseases and keep yourself healthy. What should I know about diet, weight, and exercise? Eat a healthy diet  Eat a diet that includes plenty of vegetables, fruits, low-fat dairy products, and lean protein. Do not eat a lot of foods that are high in solid fats, added sugars, or sodium. Maintain a healthy weight Body mass index (BMI) is a measurement that can be used to identify possible weight problems. It estimates body fat based on height and weight. Your health care provider can help determine your BMI and help you achieve or maintain a healthy weight. Get regular exercise Get regular exercise. This is one of the most important things you can do for your health. Most adults should: Exercise for at least 150 minutes each week. The exercise should increase your heart rate and make you sweat (moderate-intensity exercise). Do strengthening exercises at least twice a week. This is in addition to the moderate-intensity exercise. Spend less time sitting. Even light physical activity can be beneficial. Watch cholesterol and blood lipids Have your blood tested for lipids and cholesterol at 86 years of age, then have this test every 5 years. You may need to have your cholesterol levels checked more often if: Your lipid or cholesterol levels are high. You are older than 86 years of age. You are at high risk for heart disease. What should I know about cancer screening? Many types of cancers can be detected  early and may often be prevented. Depending on your health history and family history, you may need to have cancer screening at various ages. This may include screening for: Colorectal cancer. Prostate cancer. Skin cancer. Lung cancer. What should I know about heart disease, diabetes, and high blood pressure? Blood pressure and heart disease High blood pressure causes heart disease and increases the risk of stroke. This is more likely to develop in people who have high blood pressure readings or are overweight. Talk with your health care provider about your target blood pressure readings. Have your blood pressure checked: Every 3-5 years if you are 6-61 years of age. Every year if you are 54 years old or older. If you are between the ages of 75 and 45 and are a current or former smoker, ask your health care provider if you should have a one-time screening for abdominal aortic aneurysm (AAA). Diabetes Have regular diabetes screenings. This checks your fasting blood sugar level. Have the screening done: Once every three years after age 74 if you are at a normal weight and have a low risk for diabetes. More often and at a younger age if you are overweight or have a high risk for diabetes. What should I know about preventing infection? Hepatitis B If you have a higher risk for hepatitis B, you should be screened for this virus. Talk with your health care provider to find out if you are at risk for hepatitis B infection. Hepatitis C Blood testing is recommended for:  Everyone born from [birth year] through 1963. Anyone with known risk factors for hepatitis C. Sexually transmitted infections (STIs) You should be screened each year for STIs, including gonorrhea and chlamydia, if: You are sexually active and are younger than 86 years of age. You are older than 86 years of age and your health care provider tells you that you are at risk for this type of infection. Your sexual activity has changed since  you were last screened, and you are at increased risk for chlamydia or gonorrhea. Ask your health care provider if you are at risk. Ask your health care provider about whether you are at high risk for HIV. Your health care provider may recommend a prescription medicine to help prevent HIV infection. If you choose to take medicine to prevent HIV, you should first get tested for HIV. You should then be tested every 3 months for as long as you are taking the medicine. Follow these instructions at home: Alcohol use Do not drink alcohol if your health care provider tells you not to drink. If you drink alcohol: Limit how much you have to 0-2 drinks a day. Know how much alcohol is in your drink. In the [place] one drink equals one 12 oz bottle of beer (355 mL), one 5 oz glass of wine (148 mL), or one 1 oz glass of hard liquor (44 mL). Lifestyle Do not use any products that contain nicotine or tobacco. These products include cigarettes, chewing tobacco, and vaping devices, such as e-cigarettes. If you need help quitting, ask your health care provider. Do not use street drugs. Do not share needles. Ask your health care provider for help if you need support or information about quitting drugs. General instructions Schedule regular health, dental, and eye exams. Stay current with your vaccines. Tell your health care provider if: You often feel depressed. You have ever been abused or do not feel safe at home. Summary Adopting a healthy lifestyle and getting preventive care are important in promoting health and wellness. Follow your health care provider's instructions about healthy diet, exercising, and getting tested or screened for diseases. Follow your health care provider's instructions on monitoring your cholesterol and blood pressure. This information is not intended to replace advice given to you by your health care provider. Make sure you discuss any questions you have with your health care  provider. Document Revised: 12/29/2020 Document Reviewed: 12/29/2020 Elsevier Patient Education  2024 [employer].

## 2024-06-24 NOTE — Assessment & Plan Note (Signed)
 Chronic S/p radiation treatment Follows with urology regularly To have additional imaging  - may need additional treatment

## 2024-06-25 ENCOUNTER — Ambulatory Visit (INDEPENDENT_AMBULATORY_CARE_PROVIDER_SITE_OTHER): Admitting: Internal Medicine

## 2024-06-25 VITALS — BP 138/76 | HR 68 | Temp 98.2°F | Ht 68.0 in | Wt 209.0 lb

## 2024-06-25 DIAGNOSIS — R7303 Prediabetes: Secondary | ICD-10-CM | POA: Diagnosis not present

## 2024-06-25 DIAGNOSIS — Z0000 Encounter for general adult medical examination without abnormal findings: Secondary | ICD-10-CM

## 2024-06-25 DIAGNOSIS — H353211 Exudative age-related macular degeneration, right eye, with active choroidal neovascularization: Secondary | ICD-10-CM | POA: Insufficient documentation

## 2024-06-25 DIAGNOSIS — K219 Gastro-esophageal reflux disease without esophagitis: Secondary | ICD-10-CM

## 2024-06-25 DIAGNOSIS — I2510 Atherosclerotic heart disease of native coronary artery without angina pectoris: Secondary | ICD-10-CM | POA: Diagnosis not present

## 2024-06-25 DIAGNOSIS — G4733 Obstructive sleep apnea (adult) (pediatric): Secondary | ICD-10-CM | POA: Diagnosis not present

## 2024-06-25 DIAGNOSIS — I10 Essential (primary) hypertension: Secondary | ICD-10-CM

## 2024-06-25 DIAGNOSIS — C61 Malignant neoplasm of prostate: Secondary | ICD-10-CM

## 2024-06-25 DIAGNOSIS — E7800 Pure hypercholesterolemia, unspecified: Secondary | ICD-10-CM

## 2024-06-25 LAB — LIPID PANEL
Cholesterol: 143 mg/dL (ref 0–200)
HDL: 59.9 mg/dL (ref >39.00)
LDL Cholesterol: 42 mg/dL (ref 0–99)
NonHDL: 82.94
Total CHOL/HDL Ratio: 2
Triglycerides: 207 mg/dL — ABNORMAL HIGH (ref 0.0–149.0)
VLDL: 41.4 mg/dL — ABNORMAL HIGH (ref 0.0–40.0)

## 2024-06-25 LAB — CBC
HCT: 45.9 % (ref 39.0–52.0)
Hemoglobin: 15.5 g/dL (ref 13.0–17.0)
MCHC: 33.9 g/dL (ref 30.0–36.0)
MCV: 97.8 fl (ref 78.0–100.0)
Platelets: 204 K/uL (ref 150.0–400.0)
RBC: 4.69 Mil/uL (ref 4.22–5.81)
RDW: 14 % (ref 11.5–15.5)
WBC: 4.9 K/uL (ref 4.0–10.5)

## 2024-06-25 LAB — COMPREHENSIVE METABOLIC PANEL WITH GFR
ALT: 14 U/L (ref 0–53)
AST: 16 U/L (ref 0–37)
Albumin: 3.8 g/dL (ref 3.5–5.2)
Alkaline Phosphatase: 63 U/L (ref 39–117)
BUN: 11 mg/dL (ref 6–23)
CO2: 29 meq/L (ref 19–32)
Calcium: 8.7 mg/dL (ref 8.4–10.5)
Chloride: 100 meq/L (ref 96–112)
Creatinine, Ser: 0.74 mg/dL (ref 0.40–1.50)
GFR: 82.24 mL/min (ref >60.00)
Glucose, Bld: 103 mg/dL — ABNORMAL HIGH (ref 70–99)
Potassium: 4.2 meq/L (ref 3.5–5.1)
Sodium: 136 meq/L (ref 135–145)
Total Bilirubin: 0.7 mg/dL (ref 0.2–1.2)
Total Protein: 6.4 g/dL (ref 6.0–8.3)

## 2024-06-25 LAB — HEMOGLOBIN A1C: Hgb A1c MFr Bld: 6.1 % (ref 4.6–6.5)

## 2024-06-25 LAB — TSH: TSH: 1.27 u[IU]/mL (ref 0.35–5.50)

## 2024-06-25 NOTE — Assessment & Plan Note (Signed)
 Chronic Following with cardiology No symptoms consistent with angina Continue Zetia  10 mg daily, repatha  140 mg Q 14 days Continue regular exercise Check lipid panel, CMP, TSH

## 2024-06-25 NOTE — Assessment & Plan Note (Signed)
 Chronic Blood pressure well controlled Continue valsartan  260 mg daily CBC, CMP

## 2024-06-25 NOTE — Assessment & Plan Note (Signed)
 Chronic Lab Results  Component Value Date   HGBA1C 5.8 06/14/2023   Check A1c Low sugar / carb diet Stressed regular exercise

## 2024-06-25 NOTE — Assessment & Plan Note (Signed)
Chronic Using cpap nightly 

## 2024-06-25 NOTE — Assessment & Plan Note (Signed)
 Chronic GERD controlled Continue famotidine GERD diet

## 2024-06-25 NOTE — Assessment & Plan Note (Signed)
 Chronic Regular exercise and healthy diet encouraged Check lipid panel, CMP, TSH Continue Zetia  10 mg daily, repatha  140 mg q 14 days

## 2024-06-25 NOTE — Assessment & Plan Note (Addendum)
 Chronic Stable Up-to-date with ophthalmology visits

## 2024-06-26 ENCOUNTER — Ambulatory Visit: Payer: Self-pay | Admitting: Internal Medicine

## 2024-06-26 ENCOUNTER — Encounter (HOSPITAL_COMMUNITY): Payer: Self-pay | Admitting: Urology

## 2024-06-26 DIAGNOSIS — R9721 Rising PSA following treatment for malignant neoplasm of prostate: Secondary | ICD-10-CM

## 2024-07-04 ENCOUNTER — Other Ambulatory Visit (HOSPITAL_COMMUNITY): Payer: Self-pay | Admitting: Urology

## 2024-07-04 DIAGNOSIS — R9721 Rising PSA following treatment for malignant neoplasm of prostate: Secondary | ICD-10-CM

## 2024-07-13 ENCOUNTER — Encounter (HOSPITAL_COMMUNITY)
Admission: RE | Admit: 2024-07-13 | Discharge: 2024-07-13 | Disposition: A | Discharge status: Home / Self Care | Source: Ambulatory Visit | Attending: Urology | Admitting: Urology

## 2024-07-13 DIAGNOSIS — R9721 Rising PSA following treatment for malignant neoplasm of prostate: Secondary | ICD-10-CM | POA: Insufficient documentation

## 2024-07-13 MED ORDER — FLOTUFOLASTAT F 18 GALLIUM 296-5846 MBQ/ML IV SOLN
8.7100 | Freq: Once | INTRAVENOUS | Status: AC
  Administered 2024-07-13: 8.71 via INTRAVENOUS

## 2024-08-30 ENCOUNTER — Other Ambulatory Visit: Payer: Self-pay | Admitting: Cardiovascular Disease

## 2024-12-25 ENCOUNTER — Ambulatory Visit: Admitting: Internal Medicine
